# Patient Record
Sex: Female | Born: 1955 | Race: White | Hispanic: No | Marital: Married | Smoking: Former smoker
Health system: Southern US, Community
[De-identification: ages and names within clinical notes are randomized; demographics above are authoritative.]

## PROBLEM LIST (undated history)

## (undated) DIAGNOSIS — K219 Gastro-esophageal reflux disease without esophagitis: Secondary | ICD-10-CM

## (undated) DIAGNOSIS — H409 Unspecified glaucoma: Secondary | ICD-10-CM

## (undated) DIAGNOSIS — Z9289 Personal history of other medical treatment: Secondary | ICD-10-CM

## (undated) DIAGNOSIS — J439 Emphysema, unspecified: Secondary | ICD-10-CM

## (undated) DIAGNOSIS — F32A Depression, unspecified: Secondary | ICD-10-CM

## (undated) DIAGNOSIS — J449 Chronic obstructive pulmonary disease, unspecified: Secondary | ICD-10-CM

## (undated) DIAGNOSIS — T4145XA Adverse effect of unspecified anesthetic, initial encounter: Secondary | ICD-10-CM

## (undated) DIAGNOSIS — Z8709 Personal history of other diseases of the respiratory system: Secondary | ICD-10-CM

## (undated) DIAGNOSIS — I509 Heart failure, unspecified: Secondary | ICD-10-CM

## (undated) DIAGNOSIS — G629 Polyneuropathy, unspecified: Secondary | ICD-10-CM

## (undated) DIAGNOSIS — Z9981 Dependence on supplemental oxygen: Secondary | ICD-10-CM

## (undated) DIAGNOSIS — R531 Weakness: Secondary | ICD-10-CM

## (undated) DIAGNOSIS — J45909 Unspecified asthma, uncomplicated: Secondary | ICD-10-CM

## (undated) DIAGNOSIS — F329 Major depressive disorder, single episode, unspecified: Secondary | ICD-10-CM

## (undated) DIAGNOSIS — Z889 Allergy status to unspecified drugs, medicaments and biological substances status: Secondary | ICD-10-CM

## (undated) DIAGNOSIS — R42 Dizziness and giddiness: Secondary | ICD-10-CM

## (undated) DIAGNOSIS — R2689 Other abnormalities of gait and mobility: Secondary | ICD-10-CM

## (undated) DIAGNOSIS — J189 Pneumonia, unspecified organism: Secondary | ICD-10-CM

## (undated) DIAGNOSIS — T8859XA Other complications of anesthesia, initial encounter: Secondary | ICD-10-CM

## (undated) DIAGNOSIS — R0600 Dyspnea, unspecified: Secondary | ICD-10-CM

## (undated) DIAGNOSIS — M1990 Unspecified osteoarthritis, unspecified site: Secondary | ICD-10-CM

## (undated) DIAGNOSIS — M2540 Effusion, unspecified joint: Secondary | ICD-10-CM

## (undated) DIAGNOSIS — F39 Unspecified mood [affective] disorder: Secondary | ICD-10-CM

## (undated) HISTORY — DX: Heart failure, unspecified: I50.9

## (undated) HISTORY — PX: TONSILLECTOMY AND ADENOIDECTOMY: SUR1326

## (undated) HISTORY — DX: Unspecified osteoarthritis, unspecified site: M19.90

## (undated) HISTORY — DX: Gastro-esophageal reflux disease without esophagitis: K21.9

## (undated) HISTORY — PX: BACK SURGERY: SHX140

## (undated) HISTORY — PX: NECK SURGERY: SHX720

## (undated) HISTORY — PX: TUBAL LIGATION: SHX77

## (undated) HISTORY — PX: ROTATOR CUFF REPAIR: SHX139

---

## 1981-07-30 DIAGNOSIS — Z9289 Personal history of other medical treatment: Secondary | ICD-10-CM

## 1981-07-30 HISTORY — DX: Personal history of other medical treatment: Z92.89

## 2013-07-31 ENCOUNTER — Inpatient Hospital Stay (HOSPITAL_COMMUNITY)
Admission: AD | Admit: 2013-07-31 | Discharge: 2013-08-02 | DRG: 291 | Disposition: A | Discharge status: Home / Self Care | Payer: BC Managed Care – PPO | Source: Other Acute Inpatient Hospital | Attending: Family Medicine | Admitting: Family Medicine

## 2013-07-31 ENCOUNTER — Encounter (HOSPITAL_COMMUNITY): Payer: Self-pay | Admitting: Internal Medicine

## 2013-07-31 DIAGNOSIS — R0989 Other specified symptoms and signs involving the circulatory and respiratory systems: Secondary | ICD-10-CM

## 2013-07-31 DIAGNOSIS — N182 Chronic kidney disease, stage 2 (mild): Secondary | ICD-10-CM | POA: Diagnosis present

## 2013-07-31 DIAGNOSIS — Z87891 Personal history of nicotine dependence: Secondary | ICD-10-CM

## 2013-07-31 DIAGNOSIS — I5032 Chronic diastolic (congestive) heart failure: Secondary | ICD-10-CM

## 2013-07-31 DIAGNOSIS — J9819 Other pulmonary collapse: Secondary | ICD-10-CM | POA: Diagnosis present

## 2013-07-31 DIAGNOSIS — R7989 Other specified abnormal findings of blood chemistry: Secondary | ICD-10-CM | POA: Diagnosis present

## 2013-07-31 DIAGNOSIS — R0609 Other forms of dyspnea: Secondary | ICD-10-CM

## 2013-07-31 DIAGNOSIS — J9600 Acute respiratory failure, unspecified whether with hypoxia or hypercapnia: Secondary | ICD-10-CM | POA: Diagnosis present

## 2013-07-31 DIAGNOSIS — G479 Sleep disorder, unspecified: Secondary | ICD-10-CM | POA: Diagnosis present

## 2013-07-31 DIAGNOSIS — Z9889 Other specified postprocedural states: Secondary | ICD-10-CM

## 2013-07-31 DIAGNOSIS — J439 Emphysema, unspecified: Secondary | ICD-10-CM | POA: Diagnosis present

## 2013-07-31 DIAGNOSIS — I509 Heart failure, unspecified: Secondary | ICD-10-CM | POA: Diagnosis present

## 2013-07-31 DIAGNOSIS — J9601 Acute respiratory failure with hypoxia: Secondary | ICD-10-CM

## 2013-07-31 DIAGNOSIS — Z79899 Other long term (current) drug therapy: Secondary | ICD-10-CM

## 2013-07-31 DIAGNOSIS — F39 Unspecified mood [affective] disorder: Secondary | ICD-10-CM | POA: Diagnosis present

## 2013-07-31 DIAGNOSIS — I129 Hypertensive chronic kidney disease with stage 1 through stage 4 chronic kidney disease, or unspecified chronic kidney disease: Secondary | ICD-10-CM | POA: Diagnosis present

## 2013-07-31 DIAGNOSIS — I5031 Acute diastolic (congestive) heart failure: Principal | ICD-10-CM | POA: Diagnosis present

## 2013-07-31 HISTORY — DX: Unspecified mood (affective) disorder: F39

## 2013-07-31 LAB — COMPREHENSIVE METABOLIC PANEL
ALT: 24 U/L (ref 0–35)
AST: 38 U/L — AB (ref 0–37)
Albumin: 3.4 g/dL — ABNORMAL LOW (ref 3.5–5.2)
Alkaline Phosphatase: 53 U/L (ref 39–117)
BILIRUBIN TOTAL: 0.3 mg/dL (ref 0.3–1.2)
BUN: 9 mg/dL (ref 6–23)
CHLORIDE: 100 meq/L (ref 96–112)
CO2: 24 mEq/L (ref 19–32)
Calcium: 8.7 mg/dL (ref 8.4–10.5)
Creatinine, Ser: 0.85 mg/dL (ref 0.50–1.10)
GFR, EST AFRICAN AMERICAN: 88 mL/min — AB (ref >90)
GFR, EST NON AFRICAN AMERICAN: 76 mL/min — AB (ref >90)
Glucose, Bld: 184 mg/dL — ABNORMAL HIGH (ref 70–99)
Potassium: 4.4 mEq/L (ref 3.7–5.3)
Sodium: 141 mEq/L (ref 137–147)
Total Protein: 7 g/dL (ref 6.0–8.3)

## 2013-07-31 LAB — CBC WITH DIFFERENTIAL/PLATELET
Basophils Absolute: 0 10*3/uL (ref 0.0–0.1)
Basophils Relative: 0 % (ref 0–1)
Eosinophils Absolute: 0 10*3/uL (ref 0.0–0.7)
Eosinophils Relative: 0 % (ref 0–5)
HCT: 39 % (ref 36.0–46.0)
Hemoglobin: 13.1 g/dL (ref 12.0–15.0)
LYMPHS PCT: 14 % (ref 12–46)
Lymphs Abs: 0.8 10*3/uL (ref 0.7–4.0)
MCH: 32 pg (ref 26.0–34.0)
MCHC: 33.6 g/dL (ref 30.0–36.0)
MCV: 95.1 fL (ref 78.0–100.0)
MONO ABS: 0.1 10*3/uL (ref 0.1–1.0)
MONOS PCT: 1 % — AB (ref 3–12)
NEUTROS ABS: 5 10*3/uL (ref 1.7–7.7)
Neutrophils Relative %: 85 % — ABNORMAL HIGH (ref 43–77)
Platelets: 169 10*3/uL (ref 150–400)
RBC: 4.1 MIL/uL (ref 3.87–5.11)
RDW: 13.2 % (ref 11.5–15.5)
WBC: 5.9 10*3/uL (ref 4.0–10.5)

## 2013-07-31 LAB — TROPONIN I
Troponin I: <0.3 ng/mL (ref <0.30)
Troponin I: <0.3 ng/mL (ref <0.30)

## 2013-07-31 LAB — TSH: TSH: 0.202 u[IU]/mL — ABNORMAL LOW (ref 0.350–4.500)

## 2013-07-31 LAB — PROTIME-INR
INR: 0.99 (ref 0.00–1.49)
Prothrombin Time: 12.9 seconds (ref 11.6–15.2)

## 2013-07-31 LAB — PRO B NATRIURETIC PEPTIDE: Pro B Natriuretic peptide (BNP): 627.6 pg/mL — ABNORMAL HIGH (ref 0–125)

## 2013-07-31 LAB — T4, FREE: FREE T4: 1.31 ng/dL (ref 0.80–1.80)

## 2013-07-31 MED ORDER — OXYCODONE HCL 5 MG PO TABS
5.0000 mg | ORAL_TABLET | ORAL | Status: DC | PRN
  Administered 2013-07-31 (×3): 5 mg via ORAL
  Filled 2013-07-31 (×3): qty 1

## 2013-07-31 MED ORDER — IPRATROPIUM-ALBUTEROL 20-100 MCG/ACT IN AERS
1.0000 | INHALATION_SPRAY | Freq: Four times a day (QID) | RESPIRATORY_TRACT | Status: DC
  Administered 2013-07-31: 1 via RESPIRATORY_TRACT
  Filled 2013-07-31: qty 4

## 2013-07-31 MED ORDER — FUROSEMIDE 10 MG/ML IJ SOLN
40.0000 mg | Freq: Once | INTRAMUSCULAR | Status: AC
  Administered 2013-07-31: 40 mg via INTRAVENOUS
  Filled 2013-07-31: qty 4

## 2013-07-31 MED ORDER — ENOXAPARIN SODIUM 40 MG/0.4ML ~~LOC~~ SOLN
40.0000 mg | Freq: Every day | SUBCUTANEOUS | Status: DC
  Administered 2013-07-31 – 2013-08-02 (×3): 40 mg via SUBCUTANEOUS
  Filled 2013-07-31 (×3): qty 0.4

## 2013-07-31 MED ORDER — INDOMETHACIN 50 MG PO CAPS
50.0000 mg | ORAL_CAPSULE | Freq: Three times a day (TID) | ORAL | Status: DC
  Administered 2013-07-31 – 2013-08-02 (×8): 50 mg via ORAL
  Filled 2013-07-31 (×10): qty 1

## 2013-07-31 MED ORDER — OXYCODONE HCL 5 MG PO TABS
10.0000 mg | ORAL_TABLET | ORAL | Status: DC | PRN
  Administered 2013-07-31 – 2013-08-02 (×9): 10 mg via ORAL
  Filled 2013-07-31 (×9): qty 2

## 2013-07-31 MED ORDER — POTASSIUM CHLORIDE CRYS ER 20 MEQ PO TBCR
40.0000 meq | EXTENDED_RELEASE_TABLET | Freq: Once | ORAL | Status: AC
  Administered 2013-07-31: 40 meq via ORAL
  Filled 2013-07-31: qty 2

## 2013-07-31 MED ORDER — METHOCARBAMOL 500 MG PO TABS
500.0000 mg | ORAL_TABLET | Freq: Four times a day (QID) | ORAL | Status: DC | PRN
  Administered 2013-07-31 – 2013-08-02 (×4): 500 mg via ORAL
  Filled 2013-07-31 (×4): qty 1

## 2013-07-31 MED ORDER — IPRATROPIUM-ALBUTEROL 0.5-2.5 (3) MG/3ML IN SOLN
3.0000 mL | Freq: Four times a day (QID) | RESPIRATORY_TRACT | Status: DC
  Administered 2013-07-31 – 2013-08-01 (×4): 3 mL via RESPIRATORY_TRACT
  Filled 2013-07-31 (×4): qty 3

## 2013-07-31 MED ORDER — FAMOTIDINE 20 MG PO TABS
20.0000 mg | ORAL_TABLET | Freq: Two times a day (BID) | ORAL | Status: DC
  Administered 2013-07-31 – 2013-08-02 (×5): 20 mg via ORAL
  Filled 2013-07-31 (×6): qty 1

## 2013-07-31 MED ORDER — CITALOPRAM HYDROBROMIDE 40 MG PO TABS
40.0000 mg | ORAL_TABLET | Freq: Every day | ORAL | Status: DC
  Administered 2013-07-31 – 2013-08-02 (×3): 40 mg via ORAL
  Filled 2013-07-31 (×3): qty 1

## 2013-07-31 NOTE — Progress Notes (Signed)
Utilization review completed.  

## 2013-07-31 NOTE — H&P (Signed)
[facility] History and Physical  Patient: [name]  ZOX:[id number]  DOB: [birth year]  DOS: the patient was seen and examined on 07/31/2013 PCP: No primary provider on file.  Chief Complaint: Shortness of breath  HPI: [name] is a 58 y.o. female with Past medical history of mood disorder former smoker. The patient is coming from home The patient presents with complaints of shortness of breath got worse today. She mentions that she has dyspnea on exertion that has been ongoing since more than 6 months and she is only able to walk or climb one flight of stair without getting out of breath. She called that this is secondary to weight gain and she tried to lose weight without any improvement in her breathing. She recently underwent an rotator cuff repair surgery 2 days ago as an outpatient. She mentions that she was placed on oxygen both before and after the surgery. Postoperatively she was discharged home without oxygen and it is not clear whether they check her oxygen saturation before discharge. Of the discharge at home within 4-5 hours she started complaining of shortness of breath at rest. They check her oxygen saturation at home with a pulse oximeter and it was in 70s. She was taken to an urgent care clinic where she was found to have further lower oxygen saturations in 60s after giving nebulizer treatment. She was sent to the hospital [facility] at which a CT angiogram was negative for any PE and it also did not show any pneumonia. She mentions that she had some pain in her left lower back which was worsening with breathing and improved with oxygen. She denies any Chest pain, palpitation, orthopnea, PND, leg swelling. She mentions that she has not been able to sleep well due to her back and wakes up tired and also had to take daytime naps and complains of significant fatigue and lethargy ongoing since last 6 months she also snores heavily. She denies any exposure to chemicals, fumes, birds,  mold. She is a former heavy smoker and quit smoking 15 years ago  Review of Systems: as mentioned in the history of present illness.  A Comprehensive review of the other systems is negative.  Past Medical History  Diagnosis Date  . Mood disorder   . S/P rotator cuff repair   . H/O laminectomy    No past surgical history on file. Social History:  reports that she quit smoking about 15 years ago. Her smoking use included Cigarettes. She has a 30 pack-year smoking history. She does not have any smokeless tobacco history on file. Her alcohol and drug histories are not on file. Independent for most of her  ADL.  Not on File  No family history on file.  Prior to Admission medications   Medication Sig Start Date End Date Taking? Authorizing Provider  citalopram (CELEXA) 40 MG tablet Take 40 mg by mouth daily.   Yes Historical Provider, MD  indomethacin (INDOCIN) 50 MG capsule Take 50 mg by mouth 3 (three) times daily with meals.    Yes Historical Provider, MD  methocarbamol (ROBAXIN) 500 MG tablet Take 500 mg by mouth every 6 (six) hours as needed for muscle spasms.   Yes Historical Provider, MD  oxycodone (OXY-IR) 5 MG capsule Take 5 mg by mouth every 4 (four) hours as needed for pain.   Yes Historical Provider, MD  ranitidine (ZANTAC) 150 MG tablet Take 150 mg by mouth 2 (two) times daily.   Yes Historical Provider, MD  Physical Exam: There were no vitals filed for this visit.  General: Alert, Awake and Oriented to Time, Place and Person. Appear in moderate distress Eyes: PERRL ENT: Oral Mucosa clear moist. Neck: no JVD Cardiovascular: S1 and S2 Present, no Murmur, Peripheral Pulses Present Respiratory: Bilateral Air entry equal and Decreased, Clear to Auscultation,  no Crackles,no wheezes Abdomen: Bowel Sound Present, Soft and Non tender Skin: no Rash Extremities: no Pedal edema, no calf tenderness Neurologic: Grossly Unremarkable.  Labs on Admission:   From the other  facility has been reviewed  WBC 10 hemoglobin 12.7 Serum creatinine 1,  ekg NSR,  CTA negative for PE, some coronary artery disease. Mild atelectasis on the left. Possible reticular pattern on periphery.  NT BNP 1030    Assessment/Plan Principal Problem:   Acute respiratory failure with hypoxia Active Problems:   Mood disorder   S/P rotator cuff repair   H/O laminectomy   DOE (dyspnea on exertion)   Sleep disorder   1. Acute respiratory failure with hypoxia The patient is presenting with acute respiratory failure with hypoxia. She does not have any complaints of cough fever and her CT scan of the chest does not suggest any pneumonia she has any white count. Her CT scan of the chest is also negative for any pulmonary embolism. She has complaint of shortness of breath which has been ongoing since last 6 months and acutely got worse. She has been a former smoker and has some peripheral reticular pattern on a CT scan of the lung. She also appears to have some mild patchy atelectasis. At present I suspect that her current hypoxia is secondary to atelectasis postoperatively with a combination of undiagnosed COPD. Although the reticular pattern on the CT scan of the chest but also suggest possible ILD. With this she will be admitted for observation. Oxygen as needed will be given. Incentive spirometry every hour at an as needed. She was also given Combivent inhalers 4 times a day. She may require oxygen even at the time of the discharge. She has symptoms of sleep apnea he may possibly have pulmonary hypertension for which I will obtain echocardiogram. She may benefit from a PFT as an outpatient and followup with the pulmonologist as well as sleep study as an outpatient. 2. Rotator cuff injury Continue her methocarbamol and OxyIR as needed Also continuing to coolant wrap for the shoulder.  3. Mood disorder Continue Celexa  DVT Prophylaxis: subcutaneous Heparin Nutrition: Regular  diet  Code Status: Full  Family Communication: Daughter was present at bedside, opportunity was given to ask question and all questions were answered satisfactorily at the time of interview. Disposition: Admitted to observation in telemetry unit.  Author: Lynden OxfordPranav Patel, MD [facility] Pager: [phone] 07/31/2013, 3:36 AM    If 7PM-7AM, please contact night-coverage [email or web address] Password [email or web address]

## 2013-07-31 NOTE — Progress Notes (Signed)
[facility] Progress Note [place] TEAM [address]    [name] WUJ:[id number] DOB: Oct 30, 1955 DOA: 07/31/2013 PCP: No primary provider on file.  Brief narrative: 58 year old female patient with six-month history of progressive dyspnea on exertion. Initially attributed this to significant weight gain over the past year. 2 days ago patient underwent rotator cuff surgery repair as an outpatient. Apparently in the preoperative and immediate postoperative period patient was hypoxic and required oxygen but stabilized enough and was discharged without oxygen. Within 4-5 hours after being discharged home though she presented [facility] with complaints of shortness of breath. The patient apparently has a home oximetry machine and she checked her reading it was in the 70s. She presented to a local urgent care clinic and O2 saturations when this 66 she was given a nebulizer treatment without improvement. She was subsequently sent to [facility] where CT angios negative for PE but also did not show any evidence of pneumonia. Patient has had significant fatigue and lethargy and difficulty sleeping and takes daytime naps for least 6 months and apparently she snores heavily. Upon presentation to [place] she remained hypoxic and was requiring oxygen. Pro BNP was somewhat elevated.  Assessment/Plan: Active Problems:  Acute respiratory failure with hypoxia/DOE  -given acute worsening of sx's post OR could have degree of DD from volume overload/IVFs used in procedure- BNP up so will give 1x dose IV Lasix to see if sx's improve -FU on ECHO to r/o CHF- look for RMA re: ?? Ischemia -cont supportive- wean O2 as sats permit      Abnormal TSH -low at 0.202- check T3 and Free T4 -currently not tachycardic    Sleep disorder/? OSA -history seems c/w this as a diagnosis -will FU on ECHO and trend continuos pulse ox overnight (if off O2) -needs OP PSG- have asked CM to assist with appt    CKD  (chronic kidney disease), stage II    Mood disorder    S/P rotator cuff repair    H/O laminectomy   DVT prophylaxis: Lovenox Code Status: Full Family Communication: Family at bedside Disposition Plan/Expected LOS: Remain in telemetry-discharge when able to be weaned from oxygen Isolation: None Nutritional Status:  Stable  Consultants: None  Procedures: 2-D echocardiogram pending  Antibiotics: None  HPI/Subjective: Patient sitting on side of the bed. Suction in place is not experiencing any shortness of breath or dyspnea.  Objective: Blood pressure 117/72, pulse 87, temperature 98.4 F (36.9 C), temperature source Oral, resp. rate 18, height 5\' 5"  (1.651 m), weight 179 lb 4.8 oz (81.33 kg), SpO2 94.00%. No intake or output data in the 24 hours ending 07/31/13 1401   Exam: Follow up exam completed. Patient admitted at 5:21 AM today  Scheduled Meds:  Scheduled Meds: . citalopram  40 mg Oral Daily  . enoxaparin (LOVENOX) injection  40 mg Subcutaneous Daily  . famotidine  20 mg Oral BID  . indomethacin  50 mg Oral TID WC  . ipratropium-albuterol  3 mL Nebulization Q6H   Continuous Infusions:   **Reviewed in detail by the Attending Physician  Data Reviewed: Basic Metabolic Panel:  Recent Labs Lab 07/31/13 0653  NA 141  K 4.4  CL 100  CO2 24  GLUCOSE 184*  BUN 9  CREATININE 0.85  CALCIUM 8.7   Liver Function Tests:  Recent Labs Lab 07/31/13 0653  AST 38*  ALT 24  ALKPHOS 53  BILITOT 0.3  PROT 7.0  ALBUMIN 3.4*   No  results found for this basename: LIPASE, AMYLASE,  in the last 168 hours No results found for this basename: AMMONIA,  in the last 168 hours CBC:  Recent Labs Lab 07/31/13 0653  WBC 5.9  NEUTROABS 5.0  HGB 13.1  HCT 39.0  MCV 95.1  PLT 169   Cardiac Enzymes:  Recent Labs Lab 07/31/13 0653 07/31/13 0855  TROPONINI <0.30 <0.30   BNP (last 3 results)  Recent Labs  07/31/13 0653  PROBNP 627.6*   CBG: No results  found for this basename: GLUCAP,  in the last 168 hours  No results found for this or any previous visit (from the past 240 hour(s)).   Studies:  Recent x-ray studies have been reviewed in detail by the Attending Physician     Junious Silk, ANP [facility] Office  [phone] Pager [phone]  **If unable to reach the above provider after paging please contact the Flow Manager @ [phone]  On-Call/Text Page:      [email or web address]      password [email or web address]  If 7PM-7AM, please contact night-coverage [email or web address] Password [email or web address] 07/31/2013, 2:01 PM   LOS: 0 days

## 2013-07-31 NOTE — Progress Notes (Signed)
  Echocardiogram 2D Echocardiogram has been performed.  Khyle Goodell FRANCES 07/31/2013, 5:45 PM

## 2013-07-31 NOTE — Care Management Note (Signed)
    Page 1 of 1   07/31/2013     3:18:06 PM   CARE MANAGEMENT NOTE 07/31/2013  Patient:  [name],[name]   Account Number:  [id number]  Date Initiated:  07/31/2013  Documentation initiated by:  Donn PieriniWEBSTER,Lillionna Nabi  Subjective/Objective Assessment:   Pt admitted with reso. distress, s/p rotator cuff repair 2 days prior     Action/Plan:   PTA pt lived at home,   Anticipated DC Date:  08/01/2013   Anticipated DC Plan:  HOME/SELF CARE      DC Planning Services  CM consult  Other      Choice offered to / List presented to:             Status of service:  Completed, signed off Medicare Important Message given?   (If response is "NO", the following Medicare IM given date fields will be blank) Date Medicare IM given:   Date Additional Medicare IM given:    Discharge Disposition:  HOME/SELF CARE  Per UR Regulation:  Reviewed for med. necessity/level of care/duration of stay  If discussed at Long Length of Stay Meetings, dates discussed:    Comments:  07/31/13- 1500- Donn PieriniKristi Iyana Topor RN, BSN [phone] Referral received for sleep study- spoke with pt at bedside who agrees with referral- verified pt's address and information- and faxed referral to [facility]- called center and spoke with Theodoro Gristave- who states that they will call pt next week to schedule study.

## 2013-08-01 DIAGNOSIS — R6889 Other general symptoms and signs: Secondary | ICD-10-CM

## 2013-08-01 DIAGNOSIS — R0609 Other forms of dyspnea: Secondary | ICD-10-CM

## 2013-08-01 DIAGNOSIS — F39 Unspecified mood [affective] disorder: Secondary | ICD-10-CM

## 2013-08-01 DIAGNOSIS — J9600 Acute respiratory failure, unspecified whether with hypoxia or hypercapnia: Secondary | ICD-10-CM

## 2013-08-01 DIAGNOSIS — R0989 Other specified symptoms and signs involving the circulatory and respiratory systems: Secondary | ICD-10-CM

## 2013-08-01 LAB — BASIC METABOLIC PANEL
BUN: 16 mg/dL (ref 6–23)
CALCIUM: 8.3 mg/dL — AB (ref 8.4–10.5)
CHLORIDE: 100 meq/L (ref 96–112)
CO2: 26 meq/L (ref 19–32)
CREATININE: 0.95 mg/dL (ref 0.50–1.10)
GFR calc non Af Amer: 66 mL/min — ABNORMAL LOW (ref >90)
GFR, EST AFRICAN AMERICAN: 77 mL/min — AB (ref >90)
Glucose, Bld: 89 mg/dL (ref 70–99)
Potassium: 4.5 mEq/L (ref 3.7–5.3)
Sodium: 138 mEq/L (ref 137–147)

## 2013-08-01 LAB — T3: T3, Total: 90.6 ng/dl (ref 80.0–204.0)

## 2013-08-01 MED ORDER — IPRATROPIUM-ALBUTEROL 0.5-2.5 (3) MG/3ML IN SOLN: 3.0000 mL | RESPIRATORY_TRACT | Status: DC | PRN

## 2013-08-01 MED ORDER — IPRATROPIUM BROMIDE 0.02 % IN SOLN: 0.5000 mg | RESPIRATORY_TRACT | Status: DC

## 2013-08-01 MED ORDER — IPRATROPIUM-ALBUTEROL 0.5-2.5 (3) MG/3ML IN SOLN
3.0000 mL | Freq: Four times a day (QID) | RESPIRATORY_TRACT | Status: DC
  Administered 2013-08-01: 3 mL via RESPIRATORY_TRACT
  Filled 2013-08-01: qty 3

## 2013-08-01 MED ORDER — FUROSEMIDE 10 MG/ML IJ SOLN
20.0000 mg | Freq: Two times a day (BID) | INTRAMUSCULAR | Status: DC
  Administered 2013-08-01 – 2013-08-02 (×3): 20 mg via INTRAVENOUS
  Filled 2013-08-01 (×3): qty 2

## 2013-08-01 MED ORDER — FUROSEMIDE 10 MG/ML IJ SOLN
INTRAMUSCULAR | Status: AC
  Filled 2013-08-01: qty 4

## 2013-08-01 NOTE — Progress Notes (Signed)
[facility] Progress Note  TEAM [address]    Jo LaughterLinda Vanderkolk WUJ:[id number] DOB: [birth year] DOA: 07/31/2013 PCP: No primary provider on file.  Brief narrative: [age] year old female patient with six-month history of progressive dyspnea on exertion. Initially attributed this to significant weight gain over the past year. 2 days ago patient underwent rotator cuff surgery repair as an outpatient. Apparently in the preoperative and immediate postoperative period patient was hypoxic and required oxygen but stabilized enough and was discharged without oxygen. Within 4-5 hours after being discharged home though she [facility] with complaints of shortness of breath. The patient apparently has a home oximetry machine and she checked her reading it was in the 70s. She presented to a local urgent care clinic and O2 saturations when this 66 she was given a nebulizer treatment without improvement. She was subsequently sent to the [facility] where CT angios negative for PE but also did not show any evidence of pneumonia. Patient has had significant fatigue and lethargy and difficulty sleeping and takes daytime naps for least 6 months and apparently she snores heavily. Upon presentation to [place] she remained hypoxic and was requiring oxygen. Pro BNP was somewhat elevated.  Assessment/Plan: Active Problems:  Acute respiratory failure with hypoxia/DOE  Some improvement with IV lasix, but still getting hypoxic on exertion with O2 sats dropping to 80s. Will continue with Lasix 20 mg IV every 12 hours today echocardiogram showed grade 1 diastolic dysfunction We'll continue to monitor the I.'s and O.'s and try to wean off the oxygen      Abnormal TSH -low at 0.202- check T3 and Free T4 -currently not tachycardic We'll follow her PCP    Sleep disorder/? OSA -history seems c/w this as a diagnosis -needs OP PSG- have asked CM to assist with appt    CKD (chronic kidney disease),  stage II    Mood disorder    S/P rotator cuff repair- continue oxycodone when necessary for pain    H/O laminectomy    DVT prophylaxis: Lovenox Code Status: Full Family Communication: Family at bedside Disposition Plan/Expected LOS: Remain in telemetry-discharge when able to be weaned from oxygen Isolation: None Nutritional Status:  Stable  Consultants: None  Procedures: 2-D echocardiogram shows grade 1 diastolic dysfunction  Antibiotics: None  HPI/Subjective: Patient's and examined, denies shortness of breath or chest pain at this time.  Objective: Blood pressure 95/65, pulse 95, temperature 98.8 F (37.1 C), temperature source Oral, resp. rate 18, height 5\' 5"  (1.651 m), weight 83.734 kg (184 lb 9.6 oz), SpO2 93.00%.  Intake/Output Summary (Last 24 hours) at 08/01/13 1819 Last data filed at 08/01/13 1500  Gross per 24 hour  Intake    600 ml  Output      0 ml  Net    600 ml     Exam: Physical Exam: Head: Normocephalic, atraumatic.  Eyes: No signs of jaundice, EOMI Nose: Mucous membranes dry.  Throat: Oropharynx nonerythematous, no exudate appreciated.  Neck: supple,No deformities, masses, or tenderness noted. Lungs: Normal respiratory effort. B/L Clear to auscultation, no crackles or wheezes.  Heart: Regular RR. S1 and S2 normal  Abdomen: BS normoactive. Soft, Nondistended, non-tender.  Extremities: No pretibial edema, no erythema Right shoulder in sling   Scheduled Meds:  Scheduled Meds: . citalopram  40 mg Oral Daily  . enoxaparin (LOVENOX) injection  40 mg Subcutaneous Daily  . famotidine  20 mg Oral BID  . furosemide  20 mg Intravenous Q12H  . indomethacin  50 mg Oral TID WC     Data Reviewed: Basic Metabolic Panel:  Recent Labs Lab 07/31/13 0653 08/01/13 0538  NA 141 138  K 4.4 4.5  CL 100 100  CO2 24 26  GLUCOSE 184* 89  BUN 9 16  CREATININE 0.85 0.95  CALCIUM 8.7 8.3*   Liver Function Tests:  Recent Labs Lab 07/31/13 0653   AST 38*  ALT 24  ALKPHOS 53  BILITOT 0.3  PROT 7.0  ALBUMIN 3.4*   No results found for this basename: LIPASE, AMYLASE,  in the last 168 hours No results found for this basename: AMMONIA,  in the last 168 hours CBC:  Recent Labs Lab 07/31/13 0653  WBC 5.9  NEUTROABS 5.0  HGB 13.1  HCT 39.0  MCV 95.1  PLT 169   Cardiac Enzymes:  Recent Labs Lab 07/31/13 0653 07/31/13 0855 07/31/13 1435  TROPONINI <0.30 <0.30 <0.30   BNP (last 3 results)  Recent Labs  07/31/13 0653  PROBNP 627.6*   CBG: No results found for this basename: GLUCAP,  in the last 168 hours  No results found for this or any previous visit (from the past 240 hour(s)).       A

## 2013-08-01 NOTE — Progress Notes (Signed)
Unable to wean patient off oxygen desaturations to 84% on room air .Oxygen in place at 1 liter nasal  cannula oxygen saturations gradually  increase  to  90-93%.Complains of being dizzy  and short of breath when saturations low.Will continue to monitor patient.

## 2013-08-01 NOTE — Progress Notes (Signed)
Utilization Review completed.  

## 2013-08-01 NOTE — Progress Notes (Signed)
Spoke with patient about changes that MD made in treatment. She stated she did not feel as if they were making a difference. She stated that she felt like the increased her HR, and then the abrupt drop made her feel bad. I told her MD had changed her to Atrovent Q4, and she stated she did not want to be woken up at night. She asked if we could just do them as needed if she felt like she had a "heavy feeling" and I told her I could reassess her. Will change to Duoneb q4 PRN only. Patient was pleased with this change and felt like this was the care that she needed. BBS clear and slightly diminshed, SAT 93-94% on 1-2 L. RT will continue to monitor.

## 2013-08-02 DIAGNOSIS — Z9889 Other specified postprocedural states: Secondary | ICD-10-CM

## 2013-08-02 DIAGNOSIS — G479 Sleep disorder, unspecified: Secondary | ICD-10-CM

## 2013-08-02 DIAGNOSIS — I5032 Chronic diastolic (congestive) heart failure: Secondary | ICD-10-CM

## 2013-08-02 LAB — BASIC METABOLIC PANEL
BUN: 20 mg/dL (ref 6–23)
CALCIUM: 9.5 mg/dL (ref 8.4–10.5)
CO2: 30 mEq/L (ref 19–32)
Chloride: 95 mEq/L — ABNORMAL LOW (ref 96–112)
Creatinine, Ser: 0.98 mg/dL (ref 0.50–1.10)
GFR calc Af Amer: 74 mL/min — ABNORMAL LOW (ref >90)
GFR calc non Af Amer: 64 mL/min — ABNORMAL LOW (ref >90)
GLUCOSE: 90 mg/dL (ref 70–99)
Potassium: 4.3 mEq/L (ref 3.7–5.3)
Sodium: 138 mEq/L (ref 137–147)

## 2013-08-02 LAB — CBC
HCT: 41.4 % (ref 36.0–46.0)
Hemoglobin: 14.2 g/dL (ref 12.0–15.0)
MCH: 31.9 pg (ref 26.0–34.0)
MCHC: 34.3 g/dL (ref 30.0–36.0)
MCV: 93 fL (ref 78.0–100.0)
PLATELETS: 227 10*3/uL (ref 150–400)
RBC: 4.45 MIL/uL (ref 3.87–5.11)
RDW: 13 % (ref 11.5–15.5)
WBC: 8.8 10*3/uL (ref 4.0–10.5)

## 2013-08-02 MED ORDER — DOCUSATE SODIUM 100 MG PO CAPS
100.0000 mg | ORAL_CAPSULE | Freq: Two times a day (BID) | ORAL | Status: DC
  Administered 2013-08-02: 100 mg via ORAL
  Filled 2013-08-02 (×2): qty 1

## 2013-08-02 MED ORDER — POLYETHYLENE GLYCOL 3350 17 G PO PACK
17.0000 g | PACK | Freq: Every day | ORAL | Status: DC
  Administered 2013-08-02: 17 g via ORAL
  Filled 2013-08-02: qty 1

## 2013-08-02 MED ORDER — FUROSEMIDE 20 MG PO TABS: 20.0000 mg | ORAL_TABLET | Freq: Every day | ORAL | Status: DC

## 2013-08-02 MED ORDER — BISACODYL 10 MG RE SUPP: 10.0000 mg | Freq: Every day | RECTAL | Status: DC | PRN

## 2013-08-02 NOTE — Discharge Instructions (Addendum)
Check Thyroid functions as outpatient at your PCP office

## 2013-08-02 NOTE — Progress Notes (Signed)
Discharge review done with patient.   Patient acknowledged understanding of information provided.  Prescription given per MD's order.  Patient is stable and discharged home with husband. [name], Ritika Hellickson Senze

## 2013-08-02 NOTE — Progress Notes (Signed)
Utilization Review completed.  

## 2013-08-02 NOTE — Discharge Summary (Signed)
Physician Discharge Summary  [name] FAO:[id number] DOB: 1955-10-08 DOA: 07/31/2013  PCP: No primary provider on file.  Admit date: 07/31/2013 Discharge date: 08/02/2013  Time spent: *50 minutes  Recommendations for Outpatient Follow-up:  1. *Follow up PCP in one week 2. Follow up- Sleep lab for sleep study as outpatient  Discharge Diagnoses:  Active Problems:   Mood disorder   S/P rotator cuff repair   H/O laminectomy   DOE (dyspnea on exertion)   Acute respiratory failure with hypoxia   Sleep disorder/? OSA   Abnormal TSH   CKD (chronic kidney disease), stage II Acute diastolic CHF  Discharge Condition: Stable  Diet recommendation: Low salt diet  Filed Weights   07/31/13 0423 08/01/13 0438 08/02/13 0500  Weight: 81.33 kg (179 lb 4.8 oz) 83.734 kg (184 lb 9.6 oz) 84.052 kg (185 lb 4.8 oz)    History of present illness:  58 y.o. female with Past medical history of mood disorder former smoker.  The patient is coming from home  The patient presents with complaints of shortness of breath got worse today. She mentions that she has dyspnea on exertion that has been ongoing since more than 6 months and she is only able to walk or climb one flight of stair without getting out of breath. She called that this is secondary to weight gain and she tried to lose weight without any improvement in her breathing.  She recently underwent an rotator cuff repair surgery 2 days ago as an outpatient. She mentions that she was placed on oxygen both before and after the surgery. Postoperatively she was discharged home without oxygen and it is not clear whether they check her oxygen saturation before discharge.  Of the discharge at home within 4-5 hours she started complaining of shortness of breath at rest. They check her oxygen saturation at home with a pulse oximeter and it was in 70s. She was taken to an urgent care clinic where she was found to have further lower oxygen saturations in 60s after  giving nebulizer treatment. She was sent to the hospital [facility] at which a CT angiogram was negative for any PE and it also did not show any pneumonia.  She mentions that she had some pain in her left lower back which was worsening with breathing and improved with oxygen.  She denies any Chest pain, palpitation, orthopnea, PND, leg swelling. She mentions that she has not been able to sleep well due to her back and wakes up tired and also had to take daytime naps and complains of significant fatigue and lethargy ongoing since last 6 months she also snores heavily.  She denies any exposure to chemicals, fumes, birds, mold. She is a former heavy smoker and quit smoking 15 years ago      Hospital Course:  58 year old female patient with six-month history of progressive dyspnea on exertion. Initially attributed this to significant weight gain over the past year. 2 days ago patient underwent rotator cuff surgery repair as an outpatient. Apparently in the preoperative and immediate postoperative period patient was hypoxic and required oxygen but stabilized enough and was discharged without oxygen. Within 4-5 hours after being discharged home though she presented to the [facility] ER with complaints of shortness of breath. The patient apparently has a home oximetry machine and she checked her reading it was in the 70s. She presented to a local urgent care clinic and O2 saturations when this 66 she was given a nebulizer treatment without improvement. She was subsequently  sent to the [facility] ER where CT angios negative for PE but also did not show any evidence of pneumonia. Patient has had significant fatigue and lethargy and difficulty sleeping and takes daytime naps for least 6 months and apparently she snores heavily. Upon presentation to [place] she remained hypoxic and was requiring oxygen. Pro BNP was somewhat elevated.  Acute respiratory failure with hypoxia/DOE /Acute diastolic CHF Patient  was started on IV lasix for possible pulmonary edema echocardiogram showed grade 1 diastolic dysfunction.BNP was elevated to 627.6 Patient received lasix 20 mg IV q 12 hr At this time patient's dyspnea has resolved. We checked her oxygen saturation on  Walking and it remained 94% on walking.  Patient will be discharged home on Po lasix 20 mg daily, will need BMP in one week  Abnormal TSH  -Patient had low at 0.202- T3 and FT4 are with in normal limits. She will follow up PCP for repeat testing of the TSH     Sleep disorder/? OSA  -history seems c/w this as a diagnosis  -needs OP PSG-  Has been set up for sleep study as outpatient.   Mood disorder - continue Celexa  S/P rotator cuff repair- continue oxycodone when necessary for pain   H/O laminectomy      Procedures:  2 d echo  Consultations:  None  Discharge Exam: Filed Vitals:   08/02/13 1408  BP: 96/68  Pulse: 82  Temp: 98.5 F (36.9 C)  Resp: 19    General: *Appear in no acute distress Cardiovascular: *s1s2 RRR Respiratory: *Clear bilaterally  Discharge Instructions  Discharge Orders   Future Orders Complete By Expires   Diet - low sodium heart healthy  As directed    Discharge instructions  As directed    Comments:     Need to check Blood work BMP in one week to check the potassium levels and kidney function   Increase activity slowly  As directed        Medication List         citalopram 40 MG tablet  Commonly known as:  CELEXA  Take 40 mg by mouth daily.     furosemide 20 MG tablet  Commonly known as:  LASIX  Take 1 tablet (20 mg total) by mouth daily.  Start taking on:  08/03/2013     indomethacin 50 MG capsule  Commonly known as:  INDOCIN  Take 50 mg by mouth 3 (three) times daily with meals.     methocarbamol 500 MG tablet  Commonly known as:  ROBAXIN  Take 500 mg by mouth every 6 (six) hours as needed for muscle spasms.     oxycodone 5 MG capsule  Commonly known as:  OXY-IR   Take 5-10 mg by mouth every 4 (four) hours as needed for pain.     ranitidine 150 MG tablet  Commonly known as:  ZANTAC  Take 150 mg by mouth 2 (two) times daily.       No Known Allergies     Follow-up Information   Follow up with Dr Sherral Hammers. (Check BMP , Thyroid function  test in one week)       Follow up with [facility]. [phone])        The results of significant diagnostics from this hospitalization (including imaging, microbiology, ancillary and laboratory) are listed below for reference.    Significant Diagnostic Studies: No results found.  Microbiology: No results found for this or any previous visit (from  the past 240 hour(s)).   Labs: Basic Metabolic Panel:  Recent Labs Lab 07/31/13 0653 08/01/13 0538 08/02/13 0509  NA 141 138 138  K 4.4 4.5 4.3  CL 100 100 95*  CO2 24 26 30   GLUCOSE 184* 89 90  BUN 9 16 20   CREATININE 0.85 0.95 0.98  CALCIUM 8.7 8.3* 9.5   Liver Function Tests:  Recent Labs Lab 07/31/13 0653  AST 38*  ALT 24  ALKPHOS 53  BILITOT 0.3  PROT 7.0  ALBUMIN 3.4*   No results found for this basename: LIPASE, AMYLASE,  in the last 168 hours No results found for this basename: AMMONIA,  in the last 168 hours CBC:  Recent Labs Lab 07/31/13 0653 08/02/13 0509  WBC 5.9 8.8  NEUTROABS 5.0  --   HGB 13.1 14.2  HCT 39.0 41.4  MCV 95.1 93.0  PLT 169 227   Cardiac Enzymes:  Recent Labs Lab 07/31/13 0653 07/31/13 0855 07/31/13 1435  TROPONINI <0.30 <0.30 <0.30   BNP: BNP (last 3 results)  Recent Labs  07/31/13 0653  PROBNP 627.6*   CBG: No results found for this basename: GLUCAP,  in the last 168 hours     Signed:  Jamyson Jirak S  [facility] 08/02/2013, 3:12 PM

## 2013-08-02 NOTE — Progress Notes (Signed)
Patient ambulated in hallway without oxygen. O2 sats before ambulating was 92% RA, 93-95% RA while ambulating, 94% RA when returned to her room.  No c/o SOB or fatigued, but c/o left leg weakness.

## 2013-08-06 NOTE — Progress Notes (Signed)
I have reviewed the chart and agree with assessment and plan

## 2013-08-17 ENCOUNTER — Encounter: Payer: Self-pay | Admitting: Cardiology

## 2013-08-17 ENCOUNTER — Ambulatory Visit (INDEPENDENT_AMBULATORY_CARE_PROVIDER_SITE_OTHER): Payer: BC Managed Care – PPO | Admitting: Cardiology

## 2013-08-17 VITALS — BP 118/66 | HR 69 | Ht 65.0 in | Wt 169.0 lb

## 2013-08-17 DIAGNOSIS — R0989 Other specified symptoms and signs involving the circulatory and respiratory systems: Secondary | ICD-10-CM

## 2013-08-17 DIAGNOSIS — I5031 Acute diastolic (congestive) heart failure: Secondary | ICD-10-CM

## 2013-08-17 DIAGNOSIS — Z13220 Encounter for screening for lipoid disorders: Secondary | ICD-10-CM

## 2013-08-17 DIAGNOSIS — I509 Heart failure, unspecified: Secondary | ICD-10-CM

## 2013-08-17 DIAGNOSIS — E7800 Pure hypercholesterolemia, unspecified: Secondary | ICD-10-CM

## 2013-08-17 DIAGNOSIS — N182 Chronic kidney disease, stage 2 (mild): Secondary | ICD-10-CM

## 2013-08-17 DIAGNOSIS — R0609 Other forms of dyspnea: Secondary | ICD-10-CM

## 2013-08-17 LAB — LIPID PANEL
Cholesterol: 210 mg/dL — ABNORMAL HIGH (ref 0–200)
HDL: 38.5 mg/dL — AB (ref >39.00)
TRIGLYCERIDES: 299 mg/dL — AB (ref 0.0–149.0)
Total CHOL/HDL Ratio: 5
VLDL: 59.8 mg/dL — ABNORMAL HIGH (ref 0.0–40.0)

## 2013-08-17 LAB — BASIC METABOLIC PANEL
BUN: 15 mg/dL (ref 6–23)
CALCIUM: 9.3 mg/dL (ref 8.4–10.5)
CO2: 27 mEq/L (ref 19–32)
Chloride: 102 mEq/L (ref 96–112)
Creatinine, Ser: 1 mg/dL (ref 0.4–1.2)
GFR: 61.14 mL/min (ref >60.00)
Glucose, Bld: 87 mg/dL (ref 70–99)
Potassium: 4.4 mEq/L (ref 3.5–5.1)
Sodium: 138 mEq/L (ref 135–145)

## 2013-08-17 LAB — LDL CHOLESTEROL, DIRECT: LDL DIRECT: 139.9 mg/dL

## 2013-08-17 NOTE — Progress Notes (Signed)
HPI The patient was recently admitted with dyspnea.  Her symptoms started about 8 months ago.  She has had shoulder trouble and has not been active.  She has gained about 25 lbs.  She had shoulder surgery in late December.   She required oxygen during sedation because of desaturations.  When she went home she was worse and presented to urgent care and then to [facility] hospital.  She had a CT negative for PE.  However, there was some suggestion of CHF.  She was [facility] to [facility].  I have reviewed all of these records.  She had an echo with perhaps mild diastolic dysfunction.  She did have a mildly elevated BNP.  She had negative cardiac enzymes.  She presents for further evaluation.  She says that she had been getting some DOE for 8 months with 2 pillow orthopnea over a couple of weeks.  She did have some chest heaviness which she notices when she is SOB.  This is upper chest and a soreness.  There has been no new PND.   There have been no reported palpitations, presyncope or syncope.  She is not particularly active although she walks 7 steps routinely.   No Known Allergies  Current Outpatient Prescriptions  Medication Sig Dispense Refill  . citalopram (CELEXA) 40 MG tablet Take 40 mg by mouth daily.      . furosemide (LASIX) 20 MG tablet Take 1 tablet (20 mg total) by mouth daily.  30 tablet  2  . indomethacin (INDOCIN) 50 MG capsule Take 50 mg by mouth 3 (three) times daily with meals.       . methocarbamol (ROBAXIN) 500 MG tablet Take 500 mg by mouth every 6 (six) hours as needed for muscle spasms.      Marland Kitchen. oxycodone (OXY-IR) 5 MG capsule Take 5-10 mg by mouth every 4 (four) hours as needed for pain.      . ranitidine (ZANTAC) 150 MG tablet Take 150 mg by mouth 2 (two) times daily.       No current facility-administered medications for this visit.    Past Medical History  Diagnosis Date  . Mood disorder     Past Surgical History  Procedure Laterality Date  . Rotator cuff repair        right  . Back surgery      lumbar  . Tonsillectomy and adenoidectomy      Family History  Problem Relation Age of Onset  . CAD Father 5233  . COPD Mother   . Heart disease Mother     Valve disease  . CAD Brother 50    Mild CAD    History   Social History  . Marital Status: Married    Spouse Name: N/A    Number of Children: 2  . Years of Education: N/A   Occupational History  . Not on file.   Social History Main Topics  . Smoking status: Former Smoker -- 1.50 packs/day for 20 years    Types: Cigarettes    Quit date: 07/31/1998  . Smokeless tobacco: Not on file  . Alcohol Use: Not on file  . Drug Use: Not on file  . Sexual Activity: Not on file   Other Topics Concern  . Not on file   Social History Narrative   Lives with husband.    ROS:  Positive for dizziness, mild difficulty swallowing back pain, shoulder pain. Otherwise as stated in the HPI and negative for all other  systems.  PHYSICAL EXAM BP 118/66  Pulse 69  Ht 5\' 5"  (1.651 m)  Wt 169 lb (76.658 kg)  BMI 28.12 kg/m2 GENERAL:  Well appearing HEENT:  Pupils equal round and reactive, fundi not visualized, oral mucosa unremarkable NECK:  No jugular venous distention, waveform within normal limits, carotid upstroke brisk and symmetric, no bruits, no thyromegaly LYMPHATICS:  No cervical, inguinal adenopathy LUNGS:  Clear to auscultation bilaterally BACK:  No CVA tenderness CHEST:  Unremarkable HEART:  PMI not displaced or sustained,S1 and S2 within normal limits, no S3, no S4, no clicks, no rubs, no murmurs ABD:  Flat, positive bowel sounds normal in frequency in pitch, no bruits, no rebound, no guarding, no midline pulsatile mass, no hepatomegaly, no splenomegaly EXT:  2 plus pulses throughout, no edema, no cyanosis no clubbing SKIN:  No rashes no nodules NEURO:  Cranial nerves II through XII grossly intact, motor grossly intact throughout PSYCH:  Cognitively intact, oriented to person place and  time   EKG:  Sinus rhythm, rate 69, axis within normal limits, intervals within normal limits, no acute ST-T wave changes.   ASSESSMENT AND PLAN   DYSPNEA:  There is likely to be somnolent of diastolic dysfunction mild. I would also like to exclude ischemia given her risk factors. When she is able to move her arm a little bit better I will bring her back for [employer]. She would not be able to walk on a treadmill. In the meantime we talked about salt and fluid restriction. She will continue her diuretic. As she is on Lasix I will check a basic metabolic profile. She will also proceed with sleep apnea evaluation which was suggested.  FAMILY HISTORY CAD:  She has not had a screening lipid profile I will check one of these.

## 2013-08-17 NOTE — Patient Instructions (Signed)
The current medical regimen is effective;  continue present plan and medications.  Please have blood work today (BMP and Lipid)  Follow up with Dr Antoine PocheHochrein in 6 weeks.

## 2013-08-26 ENCOUNTER — Ambulatory Visit (HOSPITAL_COMMUNITY)
Admission: RE | Admit: 2013-08-26 | Discharge: 2013-08-26 | Disposition: A | Discharge status: Home / Self Care | Payer: BC Managed Care – PPO | Source: Ambulatory Visit | Attending: Critical Care Medicine | Admitting: Critical Care Medicine

## 2013-08-26 ENCOUNTER — Encounter: Payer: Self-pay | Admitting: Critical Care Medicine

## 2013-08-26 ENCOUNTER — Ambulatory Visit (INDEPENDENT_AMBULATORY_CARE_PROVIDER_SITE_OTHER): Payer: BC Managed Care – PPO | Admitting: Critical Care Medicine

## 2013-08-26 VITALS — BP 104/76 | HR 79 | Temp 98.1°F | Ht 62.5 in | Wt 168.2 lb

## 2013-08-26 DIAGNOSIS — J438 Other emphysema: Secondary | ICD-10-CM

## 2013-08-26 DIAGNOSIS — R0989 Other specified symptoms and signs involving the circulatory and respiratory systems: Secondary | ICD-10-CM

## 2013-08-26 DIAGNOSIS — R0609 Other forms of dyspnea: Secondary | ICD-10-CM | POA: Insufficient documentation

## 2013-08-26 DIAGNOSIS — J439 Emphysema, unspecified: Secondary | ICD-10-CM

## 2013-08-26 DIAGNOSIS — R062 Wheezing: Secondary | ICD-10-CM | POA: Insufficient documentation

## 2013-08-26 DIAGNOSIS — Z87891 Personal history of nicotine dependence: Secondary | ICD-10-CM | POA: Insufficient documentation

## 2013-08-26 DIAGNOSIS — Z23 Encounter for immunization: Secondary | ICD-10-CM

## 2013-08-26 LAB — PULMONARY FUNCTION TEST
DL/VA % PRED: 54 %
DL/VA: 2.46 ml/min/mmHg/L
DLCO unc % pred: 57 %
DLCO unc: 12.43 ml/min/mmHg
FEF 25-75 POST: 1.05 L/s
FEF 25-75 Pre: 0.8 L/sec
FEF2575-%CHANGE-POST: 31 %
FEF2575-%PRED-PRE: 32 %
FEF2575-%Pred-Post: 42 %
FEV1-%Change-Post: 10 %
FEV1-%PRED-POST: 70 %
FEV1-%PRED-PRE: 63 %
FEV1-POST: 1.75 L
FEV1-PRE: 1.58 L
FEV1FVC-%CHANGE-POST: 2 %
FEV1FVC-%PRED-PRE: 72 %
FEV6-%Change-Post: 6 %
FEV6-%PRED-PRE: 88 %
FEV6-%Pred-Post: 94 %
FEV6-Post: 2.91 L
FEV6-Pre: 2.73 L
FEV6FVC-%Change-Post: -1 %
FEV6FVC-%Pred-Post: 101 %
FEV6FVC-%Pred-Pre: 102 %
FVC-%CHANGE-POST: 7 %
FVC-%Pred-Post: 93 %
FVC-%Pred-Pre: 86 %
FVC-Post: 2.97 L
FVC-Pre: 2.76 L
POST FEV1/FVC RATIO: 59 %
PRE FEV1/FVC RATIO: 57 %
PRE FEV6/FVC RATIO: 99 %
Post FEV6/FVC ratio: 98 %
RV % PRED: 134 %
RV: 2.4 L
TLC % pred: 114 %
TLC: 5.45 L

## 2013-08-26 MED ORDER — FLUTICASONE FUROATE-VILANTEROL 100-25 MCG/INH IN AEPB: 1.0000 | INHALATION_SPRAY | Freq: Every day | RESPIRATORY_TRACT | Status: DC

## 2013-08-26 MED ORDER — ALBUTEROL SULFATE (2.5 MG/3ML) 0.083% IN NEBU
2.5000 mg | INHALATION_SOLUTION | Freq: Once | RESPIRATORY_TRACT | Status: AC
  Administered 2013-08-26: 2.5 mg via RESPIRATORY_TRACT

## 2013-08-26 MED ORDER — PREDNISONE 10 MG PO TABS: ORAL_TABLET | ORAL | Status: DC

## 2013-08-26 NOTE — Progress Notes (Signed)
Subjective:    Patient ID: [name], female    DOB: 04/08/1956, 58 y.o.   MRN: [id number]  HPI Comments: Hx of dyspnea for 8months rest/exertion Worse since surgery.  CT Angio Chest NEG for PE  Shortness of Breath This is a new problem. The current episode started more than 1 month ago. The problem occurs constantly. The problem has been gradually worsening. Associated symptoms include headaches, leg swelling, neck pain, orthopnea, a sore throat, sputum production and wheezing. Pertinent negatives include no abdominal pain, chest pain, claudication, coryza, ear pain, fever, hemoptysis, leg pain, PND, rash, rhinorrhea, swollen glands, syncope or vomiting. The symptoms are aggravated by any activity, emotional upset, exercise, fumes, odors, lying flat, eating and weather changes. Associated symptoms comments: Notes some sinus pressure and pndrip. Risk factors include smoking. She has tried ipratropium inhalers, beta agonist inhalers and steroid inhalers (combivent/ breo done 08/18/13: improved) for the symptoms. Her past medical history is significant for asthma, a heart failure and a recent surgery. There is no history of allergies, aspirin allergies, bronchiolitis, CAD, chronic lung disease, COPD, DVT, PE or pneumonia. [facility]. pt had shoulder surgery, desat, then went home only to return to ED 07/30/12 then [facility]. )    Past Medical History  Diagnosis Date  . Mood disorder   . SOB (shortness of breath)   . GERD (gastroesophageal reflux disease)   . CHF (congestive heart failure)   . Arthritis      Family History  Problem Relation Age of Onset  . CAD Father 8733  . COPD Mother   . Heart disease Mother     Valve disease  . CAD Brother 50    Mild CAD  . Emphysema Paternal Grandfather   . Allergies Mother   . Asthma Mother      History   Social History  . Marital Status: Married    Spouse Name: N/A    Number of Children: 2  . Years of Education: N/A   Occupational  History  . Realtor    Social History Main Topics  . Smoking status: Former Smoker -- 2.00 packs/day for 15 years    Types: Cigarettes    Quit date: 07/30/1996  . Smokeless tobacco: Never Used  . Alcohol Use: No  . Drug Use: No  . Sexual Activity: Not on file   Other Topics Concern  . Not on file   Social History Narrative   Lives with husband.     No Known Allergies   Outpatient Prescriptions Prior to Visit  Medication Sig Dispense Refill  . citalopram (CELEXA) 40 MG tablet Take 40 mg by mouth daily.      . furosemide (LASIX) 20 MG tablet Take 1 tablet (20 mg total) by mouth daily.  30 tablet  2  . indomethacin (INDOCIN) 50 MG capsule Take 50 mg by mouth 3 (three) times daily with meals.       . methocarbamol (ROBAXIN) 500 MG tablet Take 500 mg by mouth every 6 (six) hours as needed for muscle spasms.      Marland Kitchen. oxycodone (OXY-IR) 5 MG capsule Take 5-10 mg by mouth every 4 (four) hours as needed for pain.      . ranitidine (ZANTAC) 150 MG tablet Take 150 mg by mouth 2 (two) times daily.       No facility-administered medications prior to visit.    Review of Systems  Constitutional: Positive for activity change, appetite change, fatigue and unexpected weight change.  Negative for fever, chills and diaphoresis.  HENT: Positive for congestion, facial swelling, sinus pressure, sore throat, tinnitus and voice change. Negative for dental problem, ear discharge, ear pain, hearing loss, mouth sores, nosebleeds, postnasal drip, rhinorrhea, sneezing and trouble swallowing.   Eyes: Positive for visual disturbance. Negative for photophobia, discharge and itching.  Respiratory: Positive for cough, sputum production, chest tightness, shortness of breath and wheezing. Negative for apnea, hemoptysis, choking and stridor.   Cardiovascular: Positive for palpitations, orthopnea and leg swelling. Negative for chest pain, claudication, syncope and PND.  Gastrointestinal: Positive for nausea. Negative  for vomiting, abdominal pain, constipation, blood in stool and abdominal distention.  Genitourinary: Negative for dysuria, urgency, frequency, hematuria, flank pain, decreased urine volume and difficulty urinating.  Musculoskeletal: Positive for arthralgias, back pain, gait problem, joint swelling, myalgias, neck pain and neck stiffness.  Skin: Positive for pallor. Negative for color change and rash.  Neurological: Positive for dizziness, syncope, weakness, light-headedness and headaches. Negative for tremors, seizures, speech difficulty and numbness.  Hematological: Negative for adenopathy. Does not bruise/bleed easily.  Psychiatric/Behavioral: Positive for sleep disturbance. Negative for confusion and agitation. The patient is not nervous/anxious.        Objective:   Physical Exam  Filed Vitals:   08/26/13 1132  BP: 104/76  Pulse: 79  Temp: 98.1 F (36.7 C)  TempSrc: Oral  Height: 5' 2.5" (1.588 m)  Weight: 168 lb 3.2 oz (76.295 kg)  SpO2: 94%    Gen: Pleasant, well-nourished, in no distress,  normal affect  ENT: No lesions,  mouth clear,  oropharynx clear, no postnasal drip  Neck: No JVD, no TMG, no carotid bruits  Lungs: No use of accessory muscles, no dullness to percussion, distant BS  Cardiovascular: RRR, heart sounds normal, no murmur or gallops, no peripheral edema  Abdomen: soft and NT, no HSM,  BS normal  Musculoskeletal: No deformities, no cyanosis or clubbing  Neuro: alert, non focal  Skin: Warm, no lesions or rashes  No results found.  CT Chest : centrilobular emphysema NO PE  PFTs: moderate obstruction, improved with BD, Mod reduction DLCO. 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  Cholecalciferol (HM VITAMIN D3) 4000 UNITS CAPS Take by mouth daily.  . citalopram (CELEXA) 40 MG tablet Take 40 mg by mouth daily.  . COMBIVENT RESPIMAT 20-100 MCG/ACT AERS respimat Inhale 1 puff into the lungs every 6 (six) hours as needed.  . Fluticasone Furoate-Vilanterol (BREO ELLIPTA) 100-25 MCG/INH AEPB Inhale 1 puff into the lungs daily.  . furosemide (LASIX) 20 MG tablet Take 1 tablet (20 mg total) by mouth daily.  . indomethacin (INDOCIN) 50 MG capsule Take 50 mg by mouth 3 (three) times daily with meals.   . methocarbamol (ROBAXIN) 500 MG tablet Take 500 mg by mouth every 6 (six) hours as needed for muscle spasms.  Marland Kitchen omeprazole (PRILOSEC) 20 MG capsule Take 1 capsule by mouth daily.  Marland Kitchen OVER THE COUNTER MEDICATION Tumeric twice daily  . oxycodone (OXY-IR) 5 MG capsule Take 5-10 mg by mouth every 4 (four) hours as needed for pain.  . traMADol (ULTRAM) 50 MG tablet Take 2 tablets by mouth every 6 (six) hours as needed.  . [  DISCONTINUED] Fluticasone Furoate-Vilanterol (BREO ELLIPTA) 100-25 MCG/INH AEPB Inhale 1 puff into the lungs daily.  . predniSONE (DELTASONE) 10 MG tablet Take 40 mg x 3days, 30mg  x 3 days, 20 mg x 3 days, 10 mg x 3 days then stop  . [DISCONTINUED] ranitidine (ZANTAC) 150 MG tablet Take 150 mg by mouth 2 (two) times daily.

## 2013-08-26 NOTE — Patient Instructions (Signed)
Take prednisone 10 mg 4 x 3 days, 3 x 3 days, 2 x 3 days, 1 x 3 days then stop We will schedule you for a breathing study -- [facility].  Please arrive at the Admitting Office at 3:30. Ambulatory pulse oximetry Continue with Breo 1 puff daily Follow up with Dr. Delford FieldWright in 1 [place]

## 2013-08-27 NOTE — Assessment & Plan Note (Addendum)
Gold B Copd with emphysema, mild exacerbation Plan Take prednisone 10 mg 4 x 3 days, 3 x 3 days, 2 x 3 days, 1 x 3 days then stop Cont breo

## 2013-08-31 ENCOUNTER — Encounter: Payer: Self-pay | Admitting: *Deleted

## 2013-08-31 NOTE — Progress Notes (Signed)
Quick Note:  Called, spoke with pt. Informed her of results and recs per Dr. Delford FieldWright. She verbalized understanding. ______

## 2013-09-03 ENCOUNTER — Telehealth: Payer: Self-pay | Admitting: Critical Care Medicine

## 2013-09-03 NOTE — Telephone Encounter (Signed)
Pt has a ROV sched w/ PW in [place] on 09/22/13 at 2:30.  Nothing further needed at this time.  Jo FairyHolly D Pryor

## 2013-09-09 ENCOUNTER — Ambulatory Visit (HOSPITAL_BASED_OUTPATIENT_CLINIC_OR_DEPARTMENT_OTHER): Payer: BC Managed Care – PPO | Attending: Family Medicine | Admitting: Radiology

## 2013-09-09 VITALS — Ht 62.0 in | Wt 165.0 lb

## 2013-09-09 DIAGNOSIS — G4733 Obstructive sleep apnea (adult) (pediatric): Secondary | ICD-10-CM

## 2013-09-12 DIAGNOSIS — G4733 Obstructive sleep apnea (adult) (pediatric): Secondary | ICD-10-CM

## 2013-09-12 NOTE — Sleep Study (Signed)
   NAME: Jo LaughterLinda Ess DATE OF BIRTH:  Dec 09, 1955 MEDICAL RECORD NUMBER [id number]  LOCATION: [facility]  PHYSICIAN: YOUNG,CLINTON D  DATE OF STUDY: 09/09/2013  SLEEP STUDY TYPE: Nocturnal Polysomnogram               REFERRING PHYSICIAN: Meredeth IdeLama, Gagan S, MD  INDICATION FOR STUDY: Hypersomnia with sleep apnea  EPWORTH SLEEPINESS SCORE: 4/24 HEIGHT: 5\' 2"  (157.5 cm)  WEIGHT: 165 lb (74.844 kg)    Body mass index is 30.17 kg/(m^2).  NECK SIZE: 15 in.  MEDICATIONS: Charted for review  SLEEP ARCHITECTURE: Total sleep time 259.5 minutes with sleep efficiency 62%. Stage I was 5%, stage II 55.5%, stage III 11.9%, REM 27.6% of total sleep time. Sleep latency 146 minutes, REM latency 99 minutes, awake after sleep onset 13 minutes, arousal index 44.4. Bedtime medication: None  RESPIRATORY DATA: Apnea hypopnea index (AHI) 12 per hour. 52 events scored including 39 obstructive apneas, 2 central apneas, 11 hypopneas. Events were more common while nonsupine. REM AHI 30.2 per hour. Delayed sleep onset prevented application of split protocol CPAP titration.  OXYGEN DATA: Moderate snoring with oxygen desaturation to a nadir of 89% and mean oxygen saturation through the study of 91.8% on room air.  CARDIAC DATA: Sinus rhythm with occasional PVC  MOVEMENT/PARASOMNIA: Periodic limb movement syndrome. A total of 123 limb jerks were counted of which 25 were associated with arousal or wakening for periodic limb movement with arousal index of 5.8 per hour. No bathroom trips  IMPRESSION/ RECOMMENDATION:   1) Mild obstructive sleep apnea/hypopnea syndrome, AHI 12 per hour with events in all sleep positions. Moderate snoring with oxygen desaturation to a nadir of 89% and mean oxygen saturation through the study of 91.8% on room air.  2) sleep onset was delayed until after midnight with no bedtime medication taken. This prevented application of split protocol CPAP titration. This patient can  return for dedicated CPAP titration study if appropriate. 3) Video review shows the patient wearing a sling on her right arm. She slept mostly supine and on her left side. Implication for sleep comfort and relation to delay in sleep onset are unknown.   Signed, Jetty Duhamellinton Young M.D. Waymon BudgeYOUNG,CLINTON D Diplomate, American Board of Sleep Medicine  ELECTRONICALLY SIGNED ON:  09/12/2013, 11:51 AM [facility] PH: [phone]   FX: [phone] ACCREDITED BY THE AMERICAN ACADEMY OF SLEEP MEDICINE

## 2013-09-22 ENCOUNTER — Ambulatory Visit: Payer: BC Managed Care – PPO | Admitting: Adult Health

## 2013-09-24 ENCOUNTER — Ambulatory Visit: Payer: BC Managed Care – PPO | Admitting: Adult Health

## 2013-09-29 ENCOUNTER — Ambulatory Visit (INDEPENDENT_AMBULATORY_CARE_PROVIDER_SITE_OTHER): Payer: BC Managed Care – PPO | Admitting: Cardiology

## 2013-09-29 ENCOUNTER — Encounter: Payer: Self-pay | Admitting: Cardiology

## 2013-09-29 VITALS — BP 112/80 | HR 78 | Ht 64.0 in | Wt 165.0 lb

## 2013-09-29 DIAGNOSIS — I509 Heart failure, unspecified: Secondary | ICD-10-CM

## 2013-09-29 DIAGNOSIS — N182 Chronic kidney disease, stage 2 (mild): Secondary | ICD-10-CM

## 2013-09-29 DIAGNOSIS — I5032 Chronic diastolic (congestive) heart failure: Secondary | ICD-10-CM

## 2013-09-29 MED ORDER — FUROSEMIDE 20 MG PO TABS: 20.0000 mg | ORAL_TABLET | Freq: Every day | ORAL | Status: AC

## 2013-09-29 NOTE — Patient Instructions (Signed)
The current medical regimen is effective;  continue present plan and medications.  Follow up with Dr Antoine PocheHochrein in 2 months.

## 2013-09-29 NOTE — Progress Notes (Signed)
HPI The patient was recently admitted with dyspnea.  This is extensively described in the previous note. I had considered doing a stress test but off on doing this because of a shoulder injury and the fact that she has had this arm immobilized and would not be removed at for imaging and would not be a walk on a treadmill.  Since I last saw her her breathing is a little bit better. She notices if she doesn't take her Lasix if she gets some swelling and perhaps increased dyspnea. She's not however had any new chest discomfort, neck or arm discomfort. She's not had any new palpitations, presyncope or syncope. She's not describing any PND or orthopnea. She is able to tolerate her physical therapy.  No Known Allergies  Current Outpatient Prescriptions  Medication Sig Dispense Refill  . Cholecalciferol (HM VITAMIN D3) 4000 UNITS CAPS Take by mouth daily.      . citalopram (CELEXA) 40 MG tablet Take 40 mg by mouth daily.      . COMBIVENT RESPIMAT 20-100 MCG/ACT AERS respimat Inhale 1 puff into the lungs every 6 (six) hours as needed.      . Fluticasone Furoate-Vilanterol (BREO ELLIPTA) 100-25 MCG/INH AEPB Inhale 1 puff into the lungs daily.  1 each  6  . furosemide (LASIX) 20 MG tablet Take 1 tablet (20 mg total) by mouth daily.  30 tablet  2  . indomethacin (INDOCIN) 50 MG capsule Take 50 mg by mouth 3 (three) times daily with meals.       . methocarbamol (ROBAXIN) 500 MG tablet Take 750 mg by mouth every 6 (six) hours as needed for muscle spasms.       Marland Kitchen omeprazole (PRILOSEC) 20 MG capsule Take 1 capsule by mouth daily.      Marland Kitchen OVER THE COUNTER MEDICATION Tumeric twice daily      . traMADol (ULTRAM) 50 MG tablet Take 2 tablets by mouth every 6 (six) hours as needed.       No current facility-administered medications for this visit.    Past Medical History  Diagnosis Date  . Mood disorder   . SOB (shortness of breath)   . GERD (gastroesophageal reflux disease)   . CHF (congestive heart  failure)   . Arthritis     Past Surgical History  Procedure Laterality Date  . Rotator cuff repair      right  . Back surgery      lumbar  . Tonsillectomy and adenoidectomy    . Tubal ligation       ROS:  Positive for dizziness, mild difficulty swallowing back pain, shoulder pain. Otherwise as stated in the HPI and negative for all other systems.  PHYSICAL EXAM BP 112/80  Pulse 78  Ht 5\' 4"  (1.626 m)  Wt 165 lb (74.844 kg)  BMI 28.31 kg/m2 GENERAL:  Well appearing NECK:  No jugular venous distention, waveform within normal limits, carotid upstroke brisk and symmetric, no bruits, no thyromegaly LUNGS:  Clear to auscultation bilaterally HEART:  PMI not displaced or sustained,S1 and S2 within normal limits, no S3, no S4, no clicks, no rubs, no murmurs ABD:  Flat, positive bowel sounds normal in frequency in pitch, no bruits, no rebound, no guarding, no midline pulsatile mass, no hepatomegaly, no splenomegaly EXT:  2 plus pulses throughout, no edema, no cyanosis no clubbing   ASSESSMENT AND PLAN  DYSPNEA:  The patient has no new symptoms.  She is not currently having chest pain.  I have held off on stress testing because of her orthopedic injury.  She would not be able to tolerate moving her arm for any kind of imaging.  I will reassess in a couple of months to see if she has any further symptoms and whether stress testing is indicated.  She will remain on the Lasix to treat some element of diastolic dysfunction.

## 2013-10-16 ENCOUNTER — Ambulatory Visit (INDEPENDENT_AMBULATORY_CARE_PROVIDER_SITE_OTHER): Payer: BC Managed Care – PPO | Admitting: Critical Care Medicine

## 2013-10-16 ENCOUNTER — Encounter: Payer: Self-pay | Admitting: Critical Care Medicine

## 2013-10-16 ENCOUNTER — Other Ambulatory Visit: Payer: BC Managed Care – PPO

## 2013-10-16 VITALS — BP 122/84 | HR 96 | Temp 97.5°F | Ht 62.5 in | Wt 164.0 lb

## 2013-10-16 DIAGNOSIS — J439 Emphysema, unspecified: Secondary | ICD-10-CM

## 2013-10-16 DIAGNOSIS — J438 Other emphysema: Secondary | ICD-10-CM

## 2013-10-16 DIAGNOSIS — G479 Sleep disorder, unspecified: Secondary | ICD-10-CM

## 2013-10-16 MED ORDER — MOMETASONE FUROATE 50 MCG/ACT NA SUSP: 2.0000 | Freq: Every day | NASAL | Status: DC

## 2013-10-16 NOTE — Patient Instructions (Signed)
A blood test for alpha one antitrypsin assay will be obtained A referral to pulm rehab in [place] will be obtained Renewal on nasonex sent Stay on breo Return 4 months [place]

## 2013-10-17 NOTE — Progress Notes (Signed)
Patient ID: Jo LaughterLinda Owens, female   DOB: [birth year], 58 y.o.   MRN: [id number]   Subjective:    Patient ID: Jo LaughterLinda Owens, female    DOB: [birth year], 58 y.o.   MRN: [id number]  HPI 10/16/13 Chief Complaint  Patient presents with  . 2 month follow up    Breathing has improved.  Does have DOE.  Cough with small amount of off white mucus.  Chest heaviness x 1 wk.   Since the last office visit the patient is improved. There is some dyspnea on exertion. The patient's tolerate inhaled medications well Pt denies any significant sore throat, nasal congestion or excess secretions, fever, chills, sweats, unintended weight loss, pleurtic or exertional chest pain, orthopnea PND, or leg swelling Pt denies any increase in rescue therapy over baseline, denies waking up needing it or having any early am or nocturnal exacerbations of coughing/wheezing/or dyspnea. Pt also denies any obvious fluctuation in symptoms with  weather or environmental change or other alleviating or aggravating factors   Past Medical History  Diagnosis Date  . Mood disorder   . SOB (shortness of breath)   . GERD (gastroesophageal reflux disease)   . CHF (congestive heart failure)   . Arthritis      Family History  Problem Relation Age of Onset  . CAD Father 5933  . COPD Mother   . Heart disease Mother     Valve disease  . CAD Brother 50    Mild CAD  . Emphysema Paternal Grandfather   . Allergies Mother   . Asthma Mother      History   Social History  . Marital Status: Married    Spouse Name: N/A    Number of Children: 2  . Years of Education: N/A   Occupational History  . Realtor    Social History Main Topics  . Smoking status: Former Smoker -- 2.00 packs/day for 15 years    Types: Cigarettes    Quit date: 07/30/1996  . Smokeless tobacco: Never Used  . Alcohol Use: No  . Drug Use: No  . Sexual Activity: Not on file   Other Topics Concern  . Not on file   Social History Narrative   Lives with husband.      No Known Allergies   Outpatient Prescriptions Prior to Visit  Medication Sig Dispense Refill  . Cholecalciferol (HM VITAMIN D3) 4000 UNITS CAPS Take by mouth daily.      . citalopram (CELEXA) 40 MG tablet Take 40 mg by mouth daily.      . COMBIVENT RESPIMAT 20-100 MCG/ACT AERS respimat Inhale 1 puff into the lungs every 6 (six) hours as needed.      . Fluticasone Furoate-Vilanterol (BREO ELLIPTA) 100-25 MCG/INH AEPB Inhale 1 puff into the lungs daily.  1 each  6  . furosemide (LASIX) 20 MG tablet Take 1 tablet (20 mg total) by mouth daily.  90 tablet  3  . indomethacin (INDOCIN) 50 MG capsule Take 50 mg by mouth 3 (three) times daily with meals.       . methocarbamol (ROBAXIN) 500 MG tablet Take 750 mg by mouth 2 (two) times daily.       Marland Kitchen. omeprazole (PRILOSEC) 20 MG capsule Take 1 capsule by mouth daily.      Marland Kitchen. OVER THE COUNTER MEDICATION Tumeric twice daily      . traMADol (ULTRAM) 50 MG tablet Take 2 tablets by mouth 2 (two) times daily.        No  facility-administered medications prior to visit.    Review of Systems  Constitutional: Positive for activity change, appetite change and fatigue. Negative for chills, diaphoresis and unexpected weight change.  HENT: Positive for congestion and sinus pressure. Negative for dental problem, ear discharge, facial swelling, hearing loss, mouth sores, nosebleeds, postnasal drip, sneezing, tinnitus, trouble swallowing and voice change.   Eyes: Positive for visual disturbance. Negative for photophobia, discharge and itching.  Respiratory: Negative for apnea, cough, choking, chest tightness and stridor.   Cardiovascular: Negative for palpitations.  Gastrointestinal: Negative for nausea, constipation, blood in stool and abdominal distention.  Genitourinary: Negative for dysuria, urgency, frequency, hematuria, flank pain, decreased urine volume and difficulty urinating.  Musculoskeletal: Negative for arthralgias, back pain, gait problem, joint  swelling and neck stiffness.  Skin: Negative for color change and pallor.  Neurological: Positive for light-headedness. Negative for dizziness, tremors, seizures, syncope, speech difficulty, weakness and numbness.  Hematological: Negative for adenopathy. Does not bruise/bleed easily.  Psychiatric/Behavioral: Positive for sleep disturbance. Negative for confusion and agitation. The patient is not nervous/anxious.        Objective:   Physical Exam  Filed Vitals:   10/16/13 1420  BP: 122/84  Pulse: 96  Temp: 97.5 F (36.4 C)  TempSrc: Oral  Height: 5' 2.5" (1.588 m)  Weight: 74.39 kg (164 lb)  SpO2: 96%    Gen: Pleasant, well-nourished, in no distress,  normal affect  ENT: No lesions,  mouth clear,  oropharynx clear, no postnasal drip  Neck: No JVD, no TMG, no carotid bruits  Lungs: No use of accessory muscles, no dullness to percussion, distant BS  Cardiovascular: RRR, heart sounds normal, no murmur or gallops, no peripheral edema  Abdomen: soft and NT, no HSM,  BS normal  Musculoskeletal: No deformities, no cyanosis or clubbing  Neuro: alert, non focal  Skin: Warm, no lesions or rashes  No results found.    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 Cholecalciferol (HM VITAMIN D3) 4000 UNITS CAPS Take by mouth daily.  . citalopram (CELEXA) 40 MG tablet Take 40 mg by mouth daily.  . COMBIVENT RESPIMAT 20-100 MCG/ACT AERS respimat Inhale 1 puff into the lungs every 6 (six) hours as needed.  . Fluticasone Furoate-Vilanterol (BREO ELLIPTA) 100-25 MCG/INH AEPB Inhale 1 puff into the lungs daily.  . furosemide (LASIX) 20  MG tablet Take 1 tablet (20 mg total) by mouth daily.  . indomethacin (INDOCIN) 50 MG capsule Take 50 mg by mouth 3 (three) times daily with meals.   . methocarbamol (ROBAXIN) 500 MG tablet Take 750 mg by mouth 2 (two) times daily.   Marland Kitchen omeprazole (PRILOSEC) 20 MG capsule Take 1 capsule by mouth daily.  Marland Kitchen OVER THE COUNTER MEDICATION Tumeric twice daily  . traMADol (ULTRAM) 50 MG tablet Take 2 tablets by mouth 2 (two) times daily.   . mometasone (NASONEX) 50 MCG/ACT nasal spray Place 2 sprays into the nose daily.

## 2013-10-17 NOTE — Assessment & Plan Note (Signed)
Gold stage B. COPD with moderate air trapping improved on Breo Plan A blood test for alpha one antitrypsin assay will be obtained A referral to pulm rehab in [place] will be obtained Renewal on nasonex sent Stay on breo Return 4 months 

## 2013-10-21 LAB — ALPHA-1-ANTITRYPSIN: A-1 Antitrypsin, Ser: 137 mg/dL (ref 83–199)

## 2013-10-21 NOTE — Progress Notes (Signed)
Quick Note:  Call pt and tell her alpha one antitrypsin level is normal. No deficiency seen ______

## 2013-10-22 NOTE — Progress Notes (Signed)
Quick Note:  lmomtcb for pt ______ 

## 2013-10-22 NOTE — Progress Notes (Signed)
Quick Note:  Called, spoke with pt. Informed her of lab results per Dr. Wright. She verbalized understanding and voiced no further questions or concerns at this time. ______ 

## 2013-10-29 LAB — ALPHA-1 ANTITRYPSIN PHENOTYPE: A1 ANTITRYPSIN: 128 mg/dL (ref 83–199)

## 2013-12-01 ENCOUNTER — Ambulatory Visit: Payer: BC Managed Care – PPO | Admitting: Cardiology

## 2013-12-25 ENCOUNTER — Ambulatory Visit: Payer: BC Managed Care – PPO | Admitting: Cardiology

## 2014-02-08 ENCOUNTER — Ambulatory Visit: Payer: BC Managed Care – PPO | Admitting: Cardiology

## 2014-02-16 ENCOUNTER — Encounter: Payer: Self-pay | Admitting: Cardiology

## 2014-05-03 ENCOUNTER — Other Ambulatory Visit: Payer: Self-pay | Admitting: Critical Care Medicine

## 2014-05-18 ENCOUNTER — Ambulatory Visit (INDEPENDENT_AMBULATORY_CARE_PROVIDER_SITE_OTHER): Payer: BC Managed Care – PPO | Admitting: Critical Care Medicine

## 2014-05-18 ENCOUNTER — Ambulatory Visit (INDEPENDENT_AMBULATORY_CARE_PROVIDER_SITE_OTHER): Payer: BC Managed Care – PPO

## 2014-05-18 ENCOUNTER — Encounter: Payer: Self-pay | Admitting: Critical Care Medicine

## 2014-05-18 VITALS — BP 116/74 | HR 76 | Temp 98.2°F | Ht 65.0 in | Wt 165.8 lb

## 2014-05-18 DIAGNOSIS — J432 Centrilobular emphysema: Secondary | ICD-10-CM

## 2014-05-18 DIAGNOSIS — Z23 Encounter for immunization: Secondary | ICD-10-CM

## 2014-05-18 MED ORDER — FLUTICASONE PROPIONATE 50 MCG/ACT NA SUSP: 2.0000 | Freq: Every day | NASAL | Status: DC

## 2014-05-18 MED ORDER — MOMETASONE FUROATE 50 MCG/ACT NA SUSP: 2.0000 | Freq: Every day | NASAL | Status: DC

## 2014-05-18 NOTE — Assessment & Plan Note (Signed)
Copd Gold B.  Emphysematous . Plan  Referral to pulmonary rehab was made Stay on nasonex Stay on breo /combivent A flu vaccine was given  Return 4 months

## 2014-05-18 NOTE — Patient Instructions (Signed)
Referral to pulmonary rehab was made Stay on nasonex Stay on breo /combivent A flu vaccine was given  Return 4 months

## 2014-05-18 NOTE — Progress Notes (Signed)
Patient ID: [name], female   DOB: 1956/03/05, 58 y.o.   MRN: [id number]   Subjective:    Patient ID: [name], female    DOB: 1956/03/05, 58 y.o.   MRN: [id number]  HPI 05/18/2014 Chief Complaint  Patient presents with  . Follow-up    Increased DOE x 2 months.  PND x 2 days.  Nasal congestion and cough with small amount of white mucus present but has improved.  No wheezing or chest tightness/pain.  Notes more DOE x 2 months.  Pt is doing more activity.  Not on Breo.  Not able to do pulm rehab in 10/2013.  Pt denies any significant sore throat, nasal congestion or excess secretions, fever, chills, sweats, unintended weight loss, pleurtic or exertional chest pain, orthopnea PND, or leg swelling Pt denies any increase in rescue therapy over baseline, denies waking up needing it or having any early am or nocturnal exacerbations of coughing/wheezing/or dyspnea. Pt also denies any obvious fluctuation in symptoms with  weather or environmental change or other alleviating or aggravating factors  Review of Systems  Constitutional: Positive for activity change, appetite change and fatigue. Negative for chills, diaphoresis and unexpected weight change.  HENT: Positive for congestion and sinus pressure. Negative for dental problem, ear discharge, facial swelling, hearing loss, mouth sores, nosebleeds, postnasal drip, sneezing, tinnitus, trouble swallowing and voice change.   Eyes: Positive for visual disturbance. Negative for photophobia, discharge and itching.  Respiratory: Negative for apnea, cough, choking, chest tightness and stridor.   Cardiovascular: Negative for palpitations.  Gastrointestinal: Negative for nausea, constipation, blood in stool and abdominal distention.  Genitourinary: Negative for dysuria, urgency, frequency, hematuria, flank pain, decreased urine volume and difficulty urinating.  Musculoskeletal: Negative for arthralgias, back pain, gait problem, joint swelling and neck  stiffness.  Skin: Negative for color change and pallor.  Neurological: Positive for light-headedness. Negative for dizziness, tremors, seizures, syncope, speech difficulty, weakness and numbness.  Hematological: Negative for adenopathy. Does not bruise/bleed easily.  Psychiatric/Behavioral: Positive for sleep disturbance. Negative for confusion and agitation. The patient is not nervous/anxious.        Objective:   Physical Exam   Filed Vitals:   05/18/14 1116  BP: 116/74  Pulse: 76  Temp: 98.2 F (36.8 C)  TempSrc: Oral  Height: 5\' 5"  (1.651 m)  Weight: 75.206 kg (165 lb 12.8 oz)  SpO2: 96%    Gen: Pleasant, well-nourished, in no distress,  normal affect  ENT: No lesions,  mouth clear,  oropharynx clear, no postnasal drip  Neck: No JVD, no TMG, no carotid bruits  Lungs: No use of accessory muscles, no dullness to percussion, distant BS  Cardiovascular: RRR, heart sounds normal, no murmur or gallops, no peripheral edema  Abdomen: soft and NT, no HSM,  BS normal  Musculoskeletal: No deformities, no cyanosis or clubbing  Neuro: alert, non focal  Skin: Warm, no lesions or rashes  No results found.    Assessment & Plan:   Emphysema/COPD Gold B Copd Gold B.  Emphysematous . Plan  Referral to pulmonary rehab was made Stay on nasonex Stay on breo /combivent A flu vaccine was given  Return 4 months     Updated Medication List Outpatient Encounter Prescriptions as of 05/18/2014  Medication Sig  . BREO ELLIPTA 100-25 MCG/INH AEPB INHALE 1 PUFF DAILY  . buPROPion (WELLBUTRIN SR) 100 MG 12 hr tablet Take 1 tablet by mouth daily.  . Cholecalciferol (HM VITAMIN D3) 4000 UNITS CAPS Take by mouth daily as  needed.   . citalopram (CELEXA) 40 MG tablet Take 40 mg by mouth daily.  . COMBIVENT RESPIMAT 20-100 MCG/ACT AERS respimat Inhale 2 puffs into the lungs daily.   . furosemide (LASIX) 20 MG tablet Take 1 tablet (20 mg total) by mouth daily.  Marland Kitchen. guaiFENesin (MUCINEX)  600 MG 12 hr tablet Take 600 mg by mouth 2 (two) times daily.  . indomethacin (INDOCIN) 50 MG capsule Take 75 mg by mouth 2 (two) times daily.   Marland Kitchen. omeprazole (PRILOSEC) 20 MG capsule Take 1 capsule by mouth daily.  Marland Kitchen. OVER THE COUNTER MEDICATION Tumeric twice daily  . PREMPRO 0.3-1.5 MG per tablet Take 1 tablet by mouth daily.  . traMADol (ULTRAM) 50 MG tablet Take 2 tablets by mouth 2 (two) times daily as needed.   . [DISCONTINUED] mometasone (NASONEX) 50 MCG/ACT nasal spray Place 2 sprays into the nose daily.  . [DISCONTINUED] mometasone (NASONEX) 50 MCG/ACT nasal spray Place 2 sprays into the nose daily as needed.  . mometasone (NASONEX) 50 MCG/ACT nasal spray Place 2 sprays into the nose daily.  . [DISCONTINUED] fluticasone (FLONASE) 50 MCG/ACT nasal spray Place 2 sprays into both nostrils daily.  . [DISCONTINUED] methocarbamol (ROBAXIN) 500 MG tablet Take 750 mg by mouth 2 (two) times daily.

## 2014-06-02 ENCOUNTER — Ambulatory Visit (INDEPENDENT_AMBULATORY_CARE_PROVIDER_SITE_OTHER): Payer: BC Managed Care – PPO | Admitting: Cardiology

## 2014-06-02 ENCOUNTER — Encounter: Payer: Self-pay | Admitting: Cardiology

## 2014-06-02 VITALS — BP 110/70 | HR 85 | Ht 65.0 in | Wt 163.0 lb

## 2014-06-02 DIAGNOSIS — I5032 Chronic diastolic (congestive) heart failure: Secondary | ICD-10-CM

## 2014-06-02 NOTE — Patient Instructions (Signed)
Schedule Lexiscan Myoview  Follow instructions given.Schedulers will call back to schedule    Follow up with Dr.Hochrein as needed

## 2014-06-02 NOTE — Progress Notes (Signed)
HPI The patient was recently admitted with dyspnea.  This is extensively described in the previous note. I had considered doing a stress test but off on doing this because of a shoulder injurySince I last saw her her breathing is a little actually getting worse. She says she's having increasing dyspnea with walking up a small incline going to her mother's house.. She notices if she doesn't take her Lasix if she gets some swelling and perhaps increased dyspnea. She's not however had any new chest discomfort, neck or arm discomfort. She's not had any new palpitations, presyncope or syncope. She's not describing any PND or orthopnea. She is able to tolerate her physical therapy.    No Known Allergies  Current Outpatient Prescriptions  Medication Sig Dispense Refill  . BREO ELLIPTA 100-25 MCG/INH AEPB INHALE 1 PUFF DAILY 60 each 3  . buPROPion (WELLBUTRIN SR) 100 MG 12 hr tablet Take 1 tablet by mouth daily.    . Cholecalciferol (HM VITAMIN D3) 4000 UNITS CAPS Take by mouth daily as needed.     . citalopram (CELEXA) 40 MG tablet Take 40 mg by mouth daily.    . COMBIVENT RESPIMAT 20-100 MCG/ACT AERS respimat Inhale 2 puffs into the lungs daily.     . furosemide (LASIX) 20 MG tablet Take 1 tablet (20 mg total) by mouth daily. 90 tablet 3  . guaiFENesin (MUCINEX) 600 MG 12 hr tablet Take 600 mg by mouth 2 (two) times daily.    . indomethacin (INDOCIN SR) 75 MG CR capsule Take 1 capsule by mouth 2 (two) times daily.  2  . mometasone (NASONEX) 50 MCG/ACT nasal spray Place 2 sprays into the nose daily. 17 g 11  . omeprazole (PRILOSEC) 20 MG capsule Take 1 capsule by mouth daily.    Marland Kitchen. OVER THE COUNTER MEDICATION Tumeric twice daily    . PREMPRO 0.3-1.5 MG per tablet Take 1 tablet by mouth daily.    . traMADol (ULTRAM) 50 MG tablet Take 2 tablets by mouth 2 (two) times daily as needed.      No current facility-administered medications for this visit.    Past Medical History  Diagnosis Date  .  Mood disorder   . SOB (shortness of breath)   . GERD (gastroesophageal reflux disease)   . CHF (congestive heart failure)   . Arthritis     Past Surgical History  Procedure Laterality Date  . Rotator cuff repair      right  . Back surgery      lumbar  . Tonsillectomy and adenoidectomy    . Tubal ligation       ROS:  Positive for dizziness, mild difficulty swallowing back pain, shoulder pain. Otherwise as stated in the HPI and negative for all other systems.  PHYSICAL EXAM BP 110/70 mmHg  Pulse 85  Ht 5\' 5"  (1.651 m)  Wt 163 lb (73.936 kg)  BMI 27.12 kg/m2 GENERAL:  Well appearing NECK:  No jugular venous distention, waveform within normal limits, carotid upstroke brisk and symmetric, no bruits, no thyromegaly LUNGS:  Clear to auscultation bilaterally HEART:  PMI not displaced or sustained,S1 and S2 within normal limits, no S3, no S4, no clicks, no rubs, no murmurs ABD:  Flat, positive bowel sounds normal in frequency in pitch, no bruits, no rebound, no guarding, no midline pulsatile mass, no hepatomegaly, no splenomegaly EXT:  2 plus pulses throughout, no edema, no cyanosis no clubbing  EKG:  Sinus rhythm, rate 85, axis within normal  limits, intervals within normal limits, no ST-T wave changes.  06/02/2014  ASSESSMENT AND PLAN  DYSPNEA:  The patient has increasing dyspnea with exertion despite being treated now for COPD. This could always be an anginal equivalent. I would like to walk around treadmill but she would not be able to do this. Therefore, to screen for obstructive coronary disease she will need a Lexiscan Myoview.If this is normal then no further cardiovascular testing will be plan.

## 2014-06-02 NOTE — Addendum Note (Signed)
Addended by: Meda KlinefelterPUGH, Damon Baisch JOHNSON D on: 06/02/2014 05:19 PM   Modules accepted: Orders

## 2014-06-03 ENCOUNTER — Telehealth: Payer: Self-pay | Admitting: Cardiology

## 2014-06-03 NOTE — Telephone Encounter (Signed)
Left voicemail for patient to call back and schedule her lexiscan myoview. ST

## 2014-06-07 ENCOUNTER — Telehealth (HOSPITAL_COMMUNITY): Payer: Self-pay | Admitting: *Deleted

## 2014-06-29 ENCOUNTER — Telehealth (HOSPITAL_COMMUNITY): Payer: Self-pay | Admitting: *Deleted

## 2014-09-15 ENCOUNTER — Telehealth: Payer: Self-pay | Admitting: *Deleted

## 2014-09-15 NOTE — Telephone Encounter (Signed)
Called pharmacy and advised them of PA approval.  Pharmacy ran medication through and it costs $160.  Patient cannot afford that.  She says she will try the OTC nasonex and see if it will work, she will call us back and let us know if it is not working.

## 2014-09-15 NOTE — Telephone Encounter (Signed)
PA initiated through [facility] [phone] Approved: 09/15/14 - 09/16/15 Authorize # [id number]  Nothing further needed.

## 2014-12-06 ENCOUNTER — Telehealth: Payer: Self-pay | Admitting: Cardiology

## 2014-12-06 NOTE — Telephone Encounter (Signed)
Received records from [facility] for appointment on 02/07/15 with Dr Antoine PocheHochrein.  [facility] Dr Hochrein's schedule on 02/07/15. lp

## 2015-02-07 ENCOUNTER — Encounter: Payer: Self-pay | Admitting: Cardiology

## 2015-02-07 ENCOUNTER — Ambulatory Visit (INDEPENDENT_AMBULATORY_CARE_PROVIDER_SITE_OTHER): Payer: 59 | Admitting: Cardiology

## 2015-02-07 VITALS — BP 128/80 | HR 87

## 2015-02-07 DIAGNOSIS — I2584 Coronary atherosclerosis due to calcified coronary lesion: Secondary | ICD-10-CM

## 2015-02-07 DIAGNOSIS — R0600 Dyspnea, unspecified: Secondary | ICD-10-CM

## 2015-02-07 DIAGNOSIS — I2510 Atherosclerotic heart disease of native coronary artery without angina pectoris: Secondary | ICD-10-CM | POA: Diagnosis not present

## 2015-02-07 DIAGNOSIS — I5032 Chronic diastolic (congestive) heart failure: Secondary | ICD-10-CM

## 2015-02-07 NOTE — Progress Notes (Signed)
HPI The patient has been seen multiple times for dyspnea. In January 2015 she had a well preserved systolic function on echo. She apparently has had some coronary calcium. She was to have a stress test when I saw her last to evaluate ongoing dyspnea. She does have some COPD but this was thought maybe not to explain all of her shortness of breath. However, she canceled this because she was caring for her mother who has since died. The patient reports she's getting short of breath with activities such as walking 10 yards on level ground. This is slowly progressive. She's not describing new PND or orthopnea. She's not having any new palpitations, presyncope or syncope. She's not having any chest pressure, neck or arm discomfort. She does have hand swelling but not feet swelling. She'll have more swelling and she doesn't take her diuretic.   No Known Allergies  Current Outpatient Prescriptions  Medication Sig Dispense Refill  . BREO ELLIPTA 100-25 MCG/INH AEPB INHALE 1 PUFF DAILY 60 each 3  . buPROPion (WELLBUTRIN SR) 100 MG 12 hr tablet Take 150 tablets by mouth 2 (two) times daily.     . Cholecalciferol (HM VITAMIN D3) 4000 UNITS CAPS Take by mouth daily as needed.     . citalopram (CELEXA) 40 MG tablet Take 40 mg by mouth daily.    . COMBIVENT RESPIMAT 20-100 MCG/ACT AERS respimat Inhale 2 puffs into the lungs daily.     . fluticasone (FLONASE) 50 MCG/ACT nasal spray Place 1 spray into both nostrils daily.    . furosemide (LASIX) 20 MG tablet Take 1 tablet (20 mg total) by mouth daily. 90 tablet 3  . guaiFENesin (MUCINEX) 600 MG 12 hr tablet Take 600 mg by mouth 2 (two) times daily.    . indomethacin (INDOCIN SR) 75 MG CR capsule Take 75 mg by mouth 2 (two) times daily.    . mometasone (NASONEX) 50 MCG/ACT nasal spray Place 2 sprays into the nose daily. 17 g 11  . omeprazole (PRILOSEC) 20 MG capsule Take 1 capsule by mouth daily.    Marland Kitchen. OVER THE COUNTER MEDICATION Tumeric twice daily    .  PREMPRO 0.3-1.5 MG per tablet Take 1 tablet by mouth daily.    . traMADol (ULTRAM) 50 MG tablet Take 2 tablets by mouth 2 (two) times daily as needed.      No current facility-administered medications for this visit.    Past Medical History  Diagnosis Date  . Mood disorder   . SOB (shortness of breath)   . GERD (gastroesophageal reflux disease)   . CHF (congestive heart failure)   . Arthritis     Past Surgical History  Procedure Laterality Date  . Rotator cuff repair      right  . Back surgery      lumbar  . Tonsillectomy and adenoidectomy    . Tubal ligation      ROS:  Positive for vertigo, neck pain. Otherwise as stated in the HPI and negative for all other systems.  PHYSICAL EXAM BP 128/80 mmHg  Pulse 87 GENERAL:  Well appearing NECK:  No jugular venous distention, waveform within normal limits, carotid upstroke brisk and symmetric, no bruits, no thyromegaly LUNGS:  Clear to auscultation bilaterally HEART:  PMI not displaced or sustained,S1 and S2 within normal limits, no S3, no S4, no clicks, no rubs, no murmurs ABD:  Flat, positive bowel sounds normal in frequency in pitch, no bruits, no rebound, no guarding, no midline  pulsatile mass, no hepatomegaly, no splenomegaly EXT:  2 plus pulses throughout, no edema, no cyanosis no clubbing  EKG:  Sinus rhythm, rate 87, axis within normal limits, intervals within normal limits, no ST-T wave changes.  02/07/2015  ASSESSMENT AND PLAN  DYSPNEA:  This could still be an anginal equivalent. Given this the patient will need stress testing. With her shortness of breath she would not be a walk on a treadmill. Therefore, she will have a [employer].  CHRONIC DIASTOLIC HF:  I do not suspect this is contributing. She seems to be euvolemic. She will continue on meds as listed.

## 2015-02-07 NOTE — Patient Instructions (Signed)
Your physician has requested that you have a lexiscan myoview. For further information please visit [email or web address]. Please follow instruction sheet, as given.  Your physician wants you to follow-up in: 18 months with Dr. Antoine PocheHochrein. You will receive a reminder letter in the mail two months in advance. If you don't receive a letter, please call our office to schedule the follow-up appointment.

## 2015-02-18 ENCOUNTER — Telehealth (HOSPITAL_COMMUNITY): Payer: Self-pay

## 2015-02-18 NOTE — Telephone Encounter (Signed)
Encounter complete. 

## 2015-02-22 ENCOUNTER — Telehealth (HOSPITAL_COMMUNITY): Payer: Self-pay

## 2015-02-22 NOTE — Telephone Encounter (Signed)
Encounter complete. 

## 2015-02-23 ENCOUNTER — Inpatient Hospital Stay (HOSPITAL_COMMUNITY): Admission: RE | Admit: 2015-02-23 | Payer: 59 | Source: Ambulatory Visit

## 2015-02-25 ENCOUNTER — Ambulatory Visit (HOSPITAL_COMMUNITY)
Admission: RE | Admit: 2015-02-25 | Discharge: 2015-02-25 | Disposition: A | Discharge status: Home / Self Care | Payer: 59 | Source: Ambulatory Visit | Attending: Cardiology | Admitting: Cardiology

## 2015-02-25 DIAGNOSIS — I2510 Atherosclerotic heart disease of native coronary artery without angina pectoris: Secondary | ICD-10-CM

## 2015-02-25 DIAGNOSIS — Z87891 Personal history of nicotine dependence: Secondary | ICD-10-CM | POA: Insufficient documentation

## 2015-02-25 DIAGNOSIS — R5383 Other fatigue: Secondary | ICD-10-CM | POA: Insufficient documentation

## 2015-02-25 DIAGNOSIS — Z8249 Family history of ischemic heart disease and other diseases of the circulatory system: Secondary | ICD-10-CM | POA: Diagnosis not present

## 2015-02-25 DIAGNOSIS — J439 Emphysema, unspecified: Secondary | ICD-10-CM | POA: Diagnosis not present

## 2015-02-25 DIAGNOSIS — I5030 Unspecified diastolic (congestive) heart failure: Secondary | ICD-10-CM | POA: Insufficient documentation

## 2015-02-25 DIAGNOSIS — I2584 Coronary atherosclerosis due to calcified coronary lesion: Secondary | ICD-10-CM | POA: Diagnosis not present

## 2015-02-25 DIAGNOSIS — N182 Chronic kidney disease, stage 2 (mild): Secondary | ICD-10-CM | POA: Insufficient documentation

## 2015-02-25 DIAGNOSIS — R0600 Dyspnea, unspecified: Secondary | ICD-10-CM | POA: Diagnosis present

## 2015-02-25 LAB — MYOCARDIAL PERFUSION IMAGING
CHL CUP NUCLEAR SDS: 0
LV dias vol: 62 mL
LV sys vol: 30 mL
NUC STRESS TID: 1.31
Peak HR: 101 {beats}/min
Rest HR: 77 {beats}/min
SRS: 0
SSS: 0

## 2015-02-25 MED ORDER — TECHNETIUM TC 99M SESTAMIBI GENERIC - CARDIOLITE
10.1000 | Freq: Once | INTRAVENOUS | Status: AC | PRN
  Administered 2015-02-25: 10.1 via INTRAVENOUS

## 2015-02-25 MED ORDER — REGADENOSON 0.4 MG/5ML IV SOLN
0.4000 mg | Freq: Once | INTRAVENOUS | Status: AC
  Administered 2015-02-25: 0.4 mg via INTRAVENOUS

## 2015-02-25 MED ORDER — TECHNETIUM TC 99M SESTAMIBI GENERIC - CARDIOLITE
30.7000 | Freq: Once | INTRAVENOUS | Status: AC | PRN
  Administered 2015-02-25: 30.7 via INTRAVENOUS

## 2015-05-31 ENCOUNTER — Other Ambulatory Visit (HOSPITAL_COMMUNITY): Payer: Self-pay | Admitting: Neurosurgery

## 2015-06-14 ENCOUNTER — Other Ambulatory Visit: Payer: Self-pay | Admitting: Adult Health

## 2015-06-14 DIAGNOSIS — J432 Centrilobular emphysema: Secondary | ICD-10-CM

## 2015-06-15 ENCOUNTER — Ambulatory Visit (INDEPENDENT_AMBULATORY_CARE_PROVIDER_SITE_OTHER)
Admission: RE | Admit: 2015-06-15 | Discharge: 2015-06-15 | Disposition: A | Discharge status: Home / Self Care | Payer: 59 | Source: Ambulatory Visit | Attending: Adult Health | Admitting: Adult Health

## 2015-06-15 ENCOUNTER — Ambulatory Visit (INDEPENDENT_AMBULATORY_CARE_PROVIDER_SITE_OTHER): Payer: 59 | Admitting: Adult Health

## 2015-06-15 ENCOUNTER — Encounter: Payer: Self-pay | Admitting: Adult Health

## 2015-06-15 VITALS — BP 108/74 | HR 84 | Temp 97.8°F | Ht 62.0 in | Wt 163.0 lb

## 2015-06-15 DIAGNOSIS — J432 Centrilobular emphysema: Secondary | ICD-10-CM | POA: Diagnosis not present

## 2015-06-15 DIAGNOSIS — Z01818 Encounter for other preprocedural examination: Secondary | ICD-10-CM | POA: Diagnosis not present

## 2015-06-15 DIAGNOSIS — J439 Emphysema, unspecified: Secondary | ICD-10-CM | POA: Diagnosis not present

## 2015-06-15 NOTE — Progress Notes (Signed)
Chart and office note reviewed in detail along with available xrays/ labs > agree with a/p as outlined - she is acceptable risk for neck surgery

## 2015-06-15 NOTE — Progress Notes (Signed)
Subjective:    Patient ID: [name], female    DOB: [birth year], 58 y.o.   MRN: [id number]  HPI 59 yo WF with GOLD II COPD , previous patient of Dr. Delford Field  .   TEST 2015 PFT >Post BD FEV1 70%, ratio 59 , FVC 93% , DLCO 57% 06/15/2015 CXR with COPD changes    06/15/2015 Follow up : COPD and surgical clearance  Pt presents for follow up .  Says overall her breathing is at baseline with no flare in cough or dyspnea.  Does get winded with walking on incline or stairs. She is independent and drives.  She remains on BREO daily  and Combivent Twice daily  .  She denies chest pain, orthopnea , edema or fever.  Flu shot is utd   She is have neck surgery next week. She is undergoing cervical corpectomy/anterior cervical fusion by Dr. Wynetta Emery . She will require general anesthesia.  We discussed her surgical risk with her underlying pulmonary condition as below. Encouraged her to take her inhalers the day of surgery. Early mobilization is key, as directed by her surgeon.  Encouraged her to use flutter valve and incentive  Spirometry. She falls into a low to moderate risk group .   Arozullah Postperative Pulmonary Risk Score Comment Score  Type of surgery - abd ao aneurysm (27), thoracic (21), neurosurgery / upper abdominal / vascular (21), neck (11) Neck  11   Emergency Surgery - (11) Elective  0  ALbumin < 3 or poor nutritional state - (9) nml  0   BUN > 30 -  (8) nml 2015  0  Partial or completely dependent functional status - (7) Independent  0  COPD -  (6)  6  Age - 60 to 29 (4), > 70  (6) 59  0  TOTAL    Risk Stratifcation scores  - < 10, 11-19, 20-27, 28-40, >40 Low moderate 17   Major Pulmonary risks identified in the multifactorial risk analysis are but not limited to a) pneumonia; b) recurrent intubation risk; c) prolonged or recurrent acute respiratory failure needing mechanical ventilation; d) prolonged hospitalization; e) DVT/Pulmonary embolism; f) Acute Pulmonary  edema  Recommend 1. Short duration of surgery as much as possible and avoid paralytic if possible 2. Recovery in step down or ICU with Pulmonary consultation if indicated  3. DVT prophylaxis 4. Aggressive pulmonary toilet with o2, bronchodilatation flutter valve , and incentive spirometry and early ambulation 5. Would take BREO and Combivent day of surgery surgery.    Review of Systems Constitutional:   No  weight loss, night sweats,  Fevers, chills, fatigue, or  lassitude.  HEENT:   No headaches,  Difficulty swallowing,  Tooth/dental problems, or  Sore throat,                No sneezing, itching, ear ache, nasal congestion, post nasal drip,   CV:  No chest pain,  Orthopnea, PND, swelling in lower extremities, anasarca, dizziness, palpitations, syncope.   GI  No heartburn, indigestion, abdominal pain, nausea, vomiting, diarrhea, change in bowel habits, loss of appetite, bloody stools.   Resp:  .  No excess mucus, no productive cough,  No non-productive cough,  No coughing up of blood.  No change in color of mucus.  No wheezing.  No chest wall deformity  Skin: no rash or lesions.  GU: no dysuria, change in color of urine, no urgency or frequency.  No flank pain, no hematuria   MS:  No joint pain or swelling.  No decreased range of motion.  +neck and back  pain.  Psych:  No change in mood or affect. No depression or anxiety.  No memory loss.         Objective:   Physical Exam  GEN: A/Ox3; pleasant , NAD, well nourished  VS reviewed   HEENT:  [place]/AT,  EACs-clear, TMs-wnl, NOSE-clear, THROAT-clear, no lesions, no postnasal drip or exudate noted.   NECK:  Supple w/ fair ROM; no JVD; normal carotid impulses w/o bruits; no thyromegaly or nodules palpated; no lymphadenopathy.  RESP  Clear  P & A; w/o, wheezes/ rales/ or rhonchi.no accessory muscle use, no dullness to percussion  CARD:  RRR, no m/r/g  , no peripheral edema, pulses intact, no cyanosis or clubbing.  GI:   Soft &  nt; nml bowel sounds; no organomegaly or masses detected.  Musco: Warm bil, no deformities or joint swelling noted.   Neuro: alert, no focal deficits noted.    Skin: Warm, no lesions or rashes          Assessment & Plan:

## 2015-06-15 NOTE — Assessment & Plan Note (Signed)
Compensated on present regimen  Plan   Cont on current regimen   

## 2015-06-15 NOTE — Assessment & Plan Note (Signed)
Pt has underlying moderate COPD that is well controlled.  She is a low to moderate risk (Arozullah risk score)  We discussed her risk and advised on the following to pt and surgeon.   Recommend 1. Short duration of surgery as much as possible and avoid paralytic if possible 2. Recovery in step down or ICU with Pulmonary consultation if indicated  3. DVT prophylaxis 4. Aggressive pulmonary toilet with o2, bronchodilatation flutter valve , and incentive spirometry and early ambulation 5. Would take BREO and Combivent day of surgery surgery.

## 2015-06-15 NOTE — Patient Instructions (Signed)
You are at a low to moderate risk for neck surgery -cleared to proceed.  Use your flutter valve and incentive spirometry after surgery  Take your BREO and Combivent prior to surgery and continue after surgery .  Getting mobile after surgery is key as your surgeon directs you.  Follow up Dr. Sherene SiresWert  In 3 months and As needed  .

## 2015-06-17 NOTE — Progress Notes (Addendum)
Anesthesia Chart Review: Patient is a 59 year old female schedule for C6-7 ACDF on 06/22/15 by Dr. Wynetta Emeryram. Currently, she is also on the schedule for C4-5 cervical corpectomy on 07/08/15.   History includes former smoker, moderate COPD (Gold stage II), GERD, chronic diastolic CHF, mood disorder, arthritis, back surgery, T&A. Mild OSA by 2015 sleep study. PCP is She has periodically been evaluated by cardiology for dyspnea, last visit with Dr. Antoine PocheHochrein in 01/2015 with low risk stress test. No further cardiology work-up recommended.  Pulmonologist Dr. Sandrea HughsMichael Wert felt she was "acceptable risk for neck surgery." (Previously seen by Dr. Shan LevansPatrick Wright.) Additional pre-operative pulmonology recommendations outlined by Rubye Oaksammy Parrett, NP on 06/15/15: 1. Short duration of surgery as much as possible and avoid paralytic if possible 2. Recovery in step down or ICU with Pulmonary consultation if indicated  3. DVT prophylaxis 4. Aggressive pulmonary toilet with o2, bronchodilatation flutter valve , and incentive spirometry and early ambulation 5. Would take BREO and Combivent day of surgery surgery.   02/07/15 EKG: NSR.  02/25/15 Nuclear Stress Test:  The left ventricular ejection fraction is normal (50-55%). Nuclear stress EF: 51-55%.  There was no ST segment deviation noted during stress.  The study is normal.  This is a low risk study.  07/31/13 Echo: Study Conclusions Left ventricle: The cavity size was normal. Systolic function was normal. Wall motion was normal; there were no regional wall motion abnormalities. Doppler parameters are consistent with abnormal left ventricular relaxation (grade 1 diastolic dysfunction). Impressions: - Abnormal relaxation without evidence for elevated filling pressures, otherwise normal study.  06/15/15 CXR: IMPRESSION: 1. Hyper aeration may indicate emphysema. Also question bronchitis. No definite active process. 2. Bibasilar linear atelectasis or  scarring.  09/12/13 Sleep Study: IMPRESSION/ RECOMMENDATION:  1) Mild obstructive sleep apnea/hypopnea syndrome, AHI 12 per hour with events in all sleep positions. Moderate snoring with oxygen desaturation to a nadir of 89% and mean oxygen saturation through the study of 91.8% on room air.  2) sleep onset was delayed until after midnight with no bedtime medication taken. This prevented application of split protocol CPAP titration. This patient can return for dedicated CPAP titration study if appropriate. 3) Video review shows the patient wearing a sling on her right arm. She slept mostly supine and on her left side. Implication for sleep comfort and relation to delay in sleep onset are unknown.  08/26/13 PFTs: FVC 2.76 (86%), FEV1 1.58 (63%)-->Post BD FVC 93%, FEV1 70%, ratio 59, DLCO 57%. Moderate obstruction. DLCO moderately reduced.  She is for labs at PAT. (Updated: CBC and BMET from 06/20/15 noted. Anticipate she can proceed if no new changes.)  Jo Ochsllison Elonna Mcfarlane, PA-C [facility]esthesiology Phone [phone] 06/17/2015 5:36 PM

## 2015-06-20 ENCOUNTER — Ambulatory Visit: Payer: 59 | Admitting: Pulmonary Disease

## 2015-06-20 ENCOUNTER — Encounter (HOSPITAL_COMMUNITY)
Admission: RE | Admit: 2015-06-20 | Discharge: 2015-06-20 | Disposition: A | Discharge status: Home / Self Care | Payer: 59 | Source: Ambulatory Visit | Attending: Neurosurgery | Admitting: Neurosurgery

## 2015-06-20 ENCOUNTER — Encounter (HOSPITAL_COMMUNITY): Payer: Self-pay

## 2015-06-20 HISTORY — DX: Personal history of other diseases of the respiratory system: Z87.09

## 2015-06-20 HISTORY — DX: Personal history of other medical treatment: Z92.89

## 2015-06-20 HISTORY — DX: Dizziness and giddiness: R42

## 2015-06-20 HISTORY — DX: Depression, unspecified: F32.A

## 2015-06-20 HISTORY — DX: Pneumonia, unspecified organism: J18.9

## 2015-06-20 HISTORY — DX: Allergy status to unspecified drugs, medicaments and biological substances: Z88.9

## 2015-06-20 HISTORY — DX: Emphysema, unspecified: J43.9

## 2015-06-20 HISTORY — DX: Unspecified asthma, uncomplicated: J45.909

## 2015-06-20 HISTORY — DX: Effusion, unspecified joint: M25.40

## 2015-06-20 HISTORY — DX: Chronic obstructive pulmonary disease, unspecified: J44.9

## 2015-06-20 HISTORY — DX: Weakness: R53.1

## 2015-06-20 HISTORY — DX: Major depressive disorder, single episode, unspecified: F32.9

## 2015-06-20 HISTORY — DX: Unspecified glaucoma: H40.9

## 2015-06-20 LAB — CBC
HCT: 42.7 % (ref 36.0–46.0)
Hemoglobin: 14.4 g/dL (ref 12.0–15.0)
MCH: 31.6 pg (ref 26.0–34.0)
MCHC: 33.7 g/dL (ref 30.0–36.0)
MCV: 93.6 fL (ref 78.0–100.0)
PLATELETS: 219 10*3/uL (ref 150–400)
RBC: 4.56 MIL/uL (ref 3.87–5.11)
RDW: 13.8 % (ref 11.5–15.5)
WBC: 7.8 10*3/uL (ref 4.0–10.5)

## 2015-06-20 LAB — BASIC METABOLIC PANEL
Anion gap: 8 (ref 5–15)
BUN: 11 mg/dL (ref 6–20)
CO2: 24 mmol/L (ref 22–32)
CREATININE: 1.02 mg/dL — AB (ref 0.44–1.00)
Calcium: 9 mg/dL (ref 8.9–10.3)
Chloride: 106 mmol/L (ref 101–111)
GFR calc Af Amer: >60 mL/min (ref >60)
GFR, EST NON AFRICAN AMERICAN: 60 mL/min — AB (ref >60)
Glucose, Bld: 83 mg/dL (ref 65–99)
POTASSIUM: 4.1 mmol/L (ref 3.5–5.1)
SODIUM: 138 mmol/L (ref 135–145)

## 2015-06-20 LAB — SURGICAL PCR SCREEN
MRSA, PCR: NEGATIVE
STAPHYLOCOCCUS AUREUS: NEGATIVE

## 2015-06-20 NOTE — Progress Notes (Addendum)
Medical Md Dr.Kirsten Glahn  Cardiologist is Dr.Hochrein with last visit in epic from 02-07-15  Echo report in epic from 2015  stress test done in 2016  heart cath denies ever having one  Pulmonologist is Dr.Wert   CXR report in epic from 06-15-15  EKG in epic from 02-07-15  Sleep study in epic from 2015

## 2015-06-20 NOTE — Pre-Procedure Instructions (Signed)
[name]  06/20/2015      [facility] [id number] [name], [name] - [address] AT [facility] [address] [place] [address] Phone: [phone] Fax: [phone]  [id number] - [place], [place] - [address] [facility] [place] [address] Phone: [phone] Fax: [phone]  [facility] - [place], [place] - [address] [place] [address] Phone: [phone] Fax: [phone]    Your procedure is scheduled on Wed, Nov 23 @ 2:00 PM  Report to [facility] Admitting at 12:00 PM  Call this number if you have problems the morning of surgery:  [phone]   Remember:  Do not eat food or drink liquids after midnight.  Take these medicines the morning of surgery with A SIP OF WATER Wellbutrin(Bupropion),Celexa(Citalopram),Combivent<Bring Your Inhaler With You>,Flonase(Fluticasone),Omeprazole(Prilosec),and Tramadol(Ultram-if needed)              Stop taking your Indomethacin. No Goody's,BC's,Aleve,Aspirin,Ibuprofen,Motrin,Advil,Fish Oil,or any Herbal Medications.    Do not wear jewelry.  Do not wear lotions, powders, or colognes.  You may wear deodorant.             Men may shave face and neck.  Do not bring valuables to the hospital.  [facility] is not responsible for any belongings or valuables.  Contacts, dentures or bridgework may not be worn into surgery.  Leave your suitcase in the car.  After surgery it may be brought to your room.  For patients admitted to the hospital, discharge time will be determined by your treatment team.  Patients discharged the day of surgery will not be allowed to drive home.    Special instructions:  [place] - Preparing for Surgery  Before surgery, you can play an important role.  Because skin is not sterile, your skin needs to be as free of germs as possible.  You can reduce the number of germs on you skin by washing with CHG  (chlorahexidine gluconate) soap before surgery.  CHG is an antiseptic cleaner which kills germs and bonds with the skin to continue killing germs even after washing.  Please DO NOT use if you have an allergy to CHG or antibacterial soaps.  If your skin becomes reddened/irritated stop using the CHG and inform your nurse when you arrive at Short Stay.  Do not shave (including legs and underarms) for at least 48 hours prior to the first CHG shower.  You may shave your face.  Please follow these instructions carefully:   1.  Shower with CHG Soap the night before surgery and the                                morning of Surgery.  2.  If you choose to wash your hair, wash your hair first as usual with your       normal shampoo.  3.  After you shampoo, rinse your hair and body thoroughly to remove the                      Shampoo.  4.  Use CHG as you would any other liquid soap.  You can apply chg directly       to the skin and wash gently with scrungie or a clean washcloth.  5.  Apply the CHG Soap to your body ONLY  FROM THE NECK DOWN.        Do not use on open wounds or open sores.  Avoid contact with your eyes,       ears, mouth and genitals (private parts).  Wash genitals (private parts)       with your normal soap.  6.  Wash thoroughly, paying special attention to the area where your surgery        will be performed.  7.  Thoroughly rinse your body with warm water from the neck down.  8.  DO NOT shower/wash with your normal soap after using and rinsing off       the CHG Soap.  9.  Pat yourself dry with a clean towel.            10.  Wear clean pajamas.            11.  Place clean sheets on your bed the night of your first shower and do not        sleep with pets.  Day of Surgery  Do not apply any lotions/deoderants the morning of surgery.  Please wear clean clothes to the hospital/surgery center.    Please read over the following fact sheets that you were given. Pain Booklet, Coughing and  Deep Breathing, MRSA Information and Surgical Site Infection Prevention

## 2015-06-21 MED ORDER — CEFAZOLIN SODIUM-DEXTROSE 2-3 GM-% IV SOLR
2.0000 g | INTRAVENOUS | Status: AC
  Administered 2015-06-22 (×2): 2 g via INTRAVENOUS
  Filled 2015-06-21: qty 50

## 2015-06-21 MED ORDER — DEXAMETHASONE SODIUM PHOSPHATE 10 MG/ML IJ SOLN
10.0000 mg | INTRAMUSCULAR | Status: AC
  Administered 2015-06-22: 10 mg via INTRAVENOUS
  Filled 2015-06-21: qty 1

## 2015-06-22 ENCOUNTER — Encounter (HOSPITAL_COMMUNITY): Payer: Self-pay | Admitting: Certified Registered"

## 2015-06-22 ENCOUNTER — Inpatient Hospital Stay (HOSPITAL_COMMUNITY): Payer: 59 | Admitting: Vascular Surgery

## 2015-06-22 ENCOUNTER — Encounter (HOSPITAL_COMMUNITY)
Admission: RE | Disposition: A | Discharge status: Home / Self Care | Payer: Self-pay | Source: Ambulatory Visit | Attending: Neurosurgery

## 2015-06-22 ENCOUNTER — Inpatient Hospital Stay (HOSPITAL_COMMUNITY): Payer: 59

## 2015-06-22 ENCOUNTER — Inpatient Hospital Stay (HOSPITAL_COMMUNITY): Payer: 59 | Admitting: Anesthesiology

## 2015-06-22 ENCOUNTER — Inpatient Hospital Stay (HOSPITAL_COMMUNITY)
Admission: RE | Admit: 2015-06-22 | Discharge: 2015-06-25 | DRG: 472 | Disposition: A | Discharge status: Home / Self Care | Payer: 59 | Source: Ambulatory Visit | Attending: Neurosurgery | Admitting: Neurosurgery

## 2015-06-22 DIAGNOSIS — Z825 Family history of asthma and other chronic lower respiratory diseases: Secondary | ICD-10-CM | POA: Diagnosis not present

## 2015-06-22 DIAGNOSIS — J449 Chronic obstructive pulmonary disease, unspecified: Secondary | ICD-10-CM | POA: Diagnosis present

## 2015-06-22 DIAGNOSIS — J45909 Unspecified asthma, uncomplicated: Secondary | ICD-10-CM | POA: Diagnosis present

## 2015-06-22 DIAGNOSIS — M542 Cervicalgia: Secondary | ICD-10-CM | POA: Diagnosis present

## 2015-06-22 DIAGNOSIS — Z87891 Personal history of nicotine dependence: Secondary | ICD-10-CM | POA: Diagnosis not present

## 2015-06-22 DIAGNOSIS — G4733 Obstructive sleep apnea (adult) (pediatric): Secondary | ICD-10-CM | POA: Diagnosis present

## 2015-06-22 DIAGNOSIS — M4802 Spinal stenosis, cervical region: Secondary | ICD-10-CM | POA: Diagnosis present

## 2015-06-22 DIAGNOSIS — R42 Dizziness and giddiness: Secondary | ICD-10-CM | POA: Diagnosis present

## 2015-06-22 DIAGNOSIS — I5032 Chronic diastolic (congestive) heart failure: Secondary | ICD-10-CM | POA: Diagnosis present

## 2015-06-22 DIAGNOSIS — F329 Major depressive disorder, single episode, unspecified: Secondary | ICD-10-CM | POA: Diagnosis present

## 2015-06-22 DIAGNOSIS — K219 Gastro-esophageal reflux disease without esophagitis: Secondary | ICD-10-CM | POA: Diagnosis present

## 2015-06-22 DIAGNOSIS — Z419 Encounter for procedure for purposes other than remedying health state, unspecified: Secondary | ICD-10-CM

## 2015-06-22 DIAGNOSIS — Z8249 Family history of ischemic heart disease and other diseases of the circulatory system: Secondary | ICD-10-CM | POA: Diagnosis not present

## 2015-06-22 DIAGNOSIS — R0602 Shortness of breath: Secondary | ICD-10-CM

## 2015-06-22 DIAGNOSIS — M4712 Other spondylosis with myelopathy, cervical region: Secondary | ICD-10-CM | POA: Diagnosis present

## 2015-06-22 DIAGNOSIS — Z79899 Other long term (current) drug therapy: Secondary | ICD-10-CM | POA: Diagnosis not present

## 2015-06-22 HISTORY — PX: ANTERIOR CERVICAL CORPECTOMY: SHX1159

## 2015-06-22 SURGERY — ANTERIOR CERVICAL CORPECTOMY
Anesthesia: General

## 2015-06-22 MED ORDER — PHENYLEPHRINE HCL 10 MG/ML IJ SOLN
10.0000 mg | INTRAVENOUS | Status: DC | PRN
  Administered 2015-06-22: 20 ug/min via INTRAVENOUS

## 2015-06-22 MED ORDER — PROPOFOL 10 MG/ML IV BOLUS
INTRAVENOUS | Status: DC | PRN
  Administered 2015-06-22: 150 mg via INTRAVENOUS

## 2015-06-22 MED ORDER — OXYCODONE-ACETAMINOPHEN 5-325 MG PO TABS
1.0000 | ORAL_TABLET | ORAL | Status: DC | PRN
  Administered 2015-06-22 – 2015-06-25 (×10): 2 via ORAL
  Filled 2015-06-22 (×9): qty 2

## 2015-06-22 MED ORDER — CONJ ESTROG-MEDROXYPROGEST ACE 0.3-1.5 MG PO TABS
1.0000 | ORAL_TABLET | Freq: Every day | ORAL | Status: DC
Start: 2015-06-22 — End: 2015-06-24

## 2015-06-22 MED ORDER — ONDANSETRON HCL 4 MG/2ML IJ SOLN
INTRAMUSCULAR | Status: DC | PRN
  Administered 2015-06-22 (×2): 4 mg via INTRAVENOUS

## 2015-06-22 MED ORDER — SODIUM CHLORIDE 0.9 % IV SOLN: 250.0000 mL | INTRAVENOUS | Status: DC

## 2015-06-22 MED ORDER — METHOCARBAMOL 500 MG PO TABS
500.0000 mg | ORAL_TABLET | Freq: Four times a day (QID) | ORAL | Status: DC | PRN
  Administered 2015-06-22 – 2015-06-25 (×6): 500 mg via ORAL
  Filled 2015-06-22 (×5): qty 1

## 2015-06-22 MED ORDER — NEOSTIGMINE METHYLSULFATE 10 MG/10ML IV SOLN
INTRAVENOUS | Status: DC | PRN
  Administered 2015-06-22: 3 mg via INTRAVENOUS

## 2015-06-22 MED ORDER — THROMBIN 5000 UNITS EX SOLR: CUTANEOUS | Status: DC | PRN

## 2015-06-22 MED ORDER — ROCURONIUM BROMIDE 50 MG/5ML IV SOLN
INTRAVENOUS | Status: AC
Start: 2015-06-22 — End: 2015-06-22
  Filled 2015-06-22: qty 1

## 2015-06-22 MED ORDER — DEXAMETHASONE SODIUM PHOSPHATE 4 MG/ML IJ SOLN
4.0000 mg | Freq: Four times a day (QID) | INTRAMUSCULAR | Status: DC
  Administered 2015-06-24 – 2015-06-25 (×3): 4 mg via INTRAVENOUS
  Filled 2015-06-22 (×3): qty 1

## 2015-06-22 MED ORDER — GUAIFENESIN ER 600 MG PO TB12
600.0000 mg | ORAL_TABLET | Freq: Two times a day (BID) | ORAL | Status: DC
Start: 2015-06-22 — End: 2015-06-25
  Administered 2015-06-22 – 2015-06-25 (×6): 600 mg via ORAL
  Filled 2015-06-22 (×6): qty 1

## 2015-06-22 MED ORDER — INDOMETHACIN ER 75 MG PO CPCR
75.0000 mg | ORAL_CAPSULE | Freq: Two times a day (BID) | ORAL | Status: DC
  Administered 2015-06-22 – 2015-06-25 (×6): 75 mg via ORAL
  Filled 2015-06-22 (×9): qty 1

## 2015-06-22 MED ORDER — THROMBIN 20000 UNITS EX SOLR
OROMUCOSAL | Status: DC | PRN
  Administered 2015-06-22: 15:00:00 via TOPICAL

## 2015-06-22 MED ORDER — HYDROMORPHONE HCL 1 MG/ML IJ SOLN
0.2500 mg | INTRAMUSCULAR | Status: DC | PRN
  Administered 2015-06-22 (×2): 0.5 mg via INTRAVENOUS

## 2015-06-22 MED ORDER — DOCUSATE SODIUM 100 MG PO CAPS
100.0000 mg | ORAL_CAPSULE | Freq: Two times a day (BID) | ORAL | Status: DC
  Administered 2015-06-22 – 2015-06-25 (×6): 100 mg via ORAL
  Filled 2015-06-22 (×6): qty 1

## 2015-06-22 MED ORDER — MENTHOL 3 MG MT LOZG: 1.0000 | LOZENGE | OROMUCOSAL | Status: DC | PRN

## 2015-06-22 MED ORDER — FLUTICASONE PROPIONATE 50 MCG/ACT NA SUSP
1.0000 | Freq: Every day | NASAL | Status: DC
  Administered 2015-06-22 – 2015-06-24 (×3): 1 via NASAL
  Filled 2015-06-22: qty 16

## 2015-06-22 MED ORDER — HYDROMORPHONE HCL 1 MG/ML IJ SOLN
INTRAMUSCULAR | Status: AC
  Filled 2015-06-22: qty 1

## 2015-06-22 MED ORDER — GLYCOPYRROLATE 0.2 MG/ML IJ SOLN
INTRAMUSCULAR | Status: DC | PRN
  Administered 2015-06-22: 0.4 mg via INTRAVENOUS

## 2015-06-22 MED ORDER — IPRATROPIUM-ALBUTEROL 0.5-2.5 (3) MG/3ML IN SOLN
3.0000 mL | Freq: Every day | RESPIRATORY_TRACT | Status: DC
  Administered 2015-06-23 – 2015-06-25 (×3): 3 mL via RESPIRATORY_TRACT
  Filled 2015-06-22 (×3): qty 3

## 2015-06-22 MED ORDER — CITALOPRAM HYDROBROMIDE 40 MG PO TABS
40.0000 mg | ORAL_TABLET | Freq: Every day | ORAL | Status: DC
  Administered 2015-06-22 – 2015-06-25 (×4): 40 mg via ORAL
  Filled 2015-06-22 (×4): qty 1

## 2015-06-22 MED ORDER — ALBUTEROL SULFATE HFA 108 (90 BASE) MCG/ACT IN AERS
INHALATION_SPRAY | RESPIRATORY_TRACT | Status: DC | PRN
  Administered 2015-06-22: 4 via RESPIRATORY_TRACT

## 2015-06-22 MED ORDER — PHENOL 1.4 % MT LIQD: 1.0000 | OROMUCOSAL | Status: DC | PRN

## 2015-06-22 MED ORDER — ACETAMINOPHEN 325 MG PO TABS: 650.0000 mg | ORAL_TABLET | ORAL | Status: DC | PRN

## 2015-06-22 MED ORDER — LIDOCAINE HCL (CARDIAC) 20 MG/ML IV SOLN
INTRAVENOUS | Status: AC
  Filled 2015-06-22: qty 5

## 2015-06-22 MED ORDER — ROCURONIUM BROMIDE 100 MG/10ML IV SOLN
INTRAVENOUS | Status: DC | PRN
  Administered 2015-06-22: 50 mg via INTRAVENOUS

## 2015-06-22 MED ORDER — SODIUM CHLORIDE 0.9 % IR SOLN
Status: DC | PRN
  Administered 2015-06-22: 15:00:00

## 2015-06-22 MED ORDER — METHOCARBAMOL 500 MG PO TABS
ORAL_TABLET | ORAL | Status: AC
  Filled 2015-06-22: qty 1

## 2015-06-22 MED ORDER — ALBUTEROL SULFATE HFA 108 (90 BASE) MCG/ACT IN AERS
INHALATION_SPRAY | RESPIRATORY_TRACT | Status: AC
  Filled 2015-06-22: qty 6.7

## 2015-06-22 MED ORDER — PHENYLEPHRINE HCL 10 MG/ML IJ SOLN
INTRAMUSCULAR | Status: DC | PRN
  Administered 2015-06-22: 40 ug via INTRAVENOUS
  Administered 2015-06-22 (×2): 80 ug via INTRAVENOUS
  Administered 2015-06-22: 40 ug via INTRAVENOUS
  Administered 2015-06-22: 120 ug via INTRAVENOUS
  Administered 2015-06-22: 40 ug via INTRAVENOUS
  Administered 2015-06-22: 120 ug via INTRAVENOUS
  Administered 2015-06-22: 80 ug via INTRAVENOUS

## 2015-06-22 MED ORDER — PROPOFOL 10 MG/ML IV BOLUS
INTRAVENOUS | Status: AC
Start: 2015-06-22 — End: 2015-06-22
  Filled 2015-06-22: qty 20

## 2015-06-22 MED ORDER — FENTANYL CITRATE (PF) 100 MCG/2ML IJ SOLN
INTRAMUSCULAR | Status: DC | PRN
  Administered 2015-06-22: 50 ug via INTRAVENOUS
  Administered 2015-06-22: 100 ug via INTRAVENOUS
  Administered 2015-06-22 (×2): 50 ug via INTRAVENOUS

## 2015-06-22 MED ORDER — MIDAZOLAM HCL 2 MG/2ML IJ SOLN
INTRAMUSCULAR | Status: AC
Start: 2015-06-22 — End: 2015-06-22
  Filled 2015-06-22: qty 2

## 2015-06-22 MED ORDER — THROMBIN 5000 UNITS EX SOLR
CUTANEOUS | Status: DC | PRN
  Administered 2015-06-22 (×2): via TOPICAL

## 2015-06-22 MED ORDER — ONDANSETRON HCL 4 MG/2ML IJ SOLN
INTRAMUSCULAR | Status: AC
Start: 2015-06-22 — End: 2015-06-22
  Filled 2015-06-22: qty 2

## 2015-06-22 MED ORDER — HYDROMORPHONE HCL 1 MG/ML IJ SOLN
0.5000 mg | INTRAMUSCULAR | Status: DC | PRN
  Administered 2015-06-22 – 2015-06-24 (×6): 1 mg via INTRAVENOUS
  Filled 2015-06-22 (×6): qty 1

## 2015-06-22 MED ORDER — ACETAMINOPHEN 650 MG RE SUPP: 650.0000 mg | RECTAL | Status: DC | PRN

## 2015-06-22 MED ORDER — FENTANYL CITRATE (PF) 250 MCG/5ML IJ SOLN
INTRAMUSCULAR | Status: AC
  Filled 2015-06-22: qty 5

## 2015-06-22 MED ORDER — PANTOPRAZOLE SODIUM 40 MG PO TBEC
40.0000 mg | DELAYED_RELEASE_TABLET | Freq: Every day | ORAL | Status: DC
  Administered 2015-06-23 – 2015-06-25 (×3): 40 mg via ORAL
  Filled 2015-06-22 (×3): qty 1

## 2015-06-22 MED ORDER — 0.9 % SODIUM CHLORIDE (POUR BTL) OPTIME
TOPICAL | Status: DC | PRN
  Administered 2015-06-22: 1000 mL

## 2015-06-22 MED ORDER — OXYCODONE-ACETAMINOPHEN 5-325 MG PO TABS
ORAL_TABLET | ORAL | Status: AC
  Filled 2015-06-22: qty 2

## 2015-06-22 MED ORDER — CYCLOBENZAPRINE HCL 10 MG PO TABS: 10.0000 mg | ORAL_TABLET | Freq: Three times a day (TID) | ORAL | Status: DC | PRN

## 2015-06-22 MED ORDER — SODIUM CHLORIDE 0.9 % IJ SOLN: 3.0000 mL | INTRAMUSCULAR | Status: DC | PRN

## 2015-06-22 MED ORDER — THROMBIN 20000 UNITS EX KIT: PACK | CUTANEOUS | Status: DC | PRN

## 2015-06-22 MED ORDER — PROMETHAZINE HCL 25 MG/ML IJ SOLN: 6.2500 mg | INTRAMUSCULAR | Status: DC | PRN

## 2015-06-22 MED ORDER — ONDANSETRON HCL 4 MG/2ML IJ SOLN: 4.0000 mg | INTRAMUSCULAR | Status: DC | PRN

## 2015-06-22 MED ORDER — DEXAMETHASONE 4 MG PO TABS
4.0000 mg | ORAL_TABLET | Freq: Four times a day (QID) | ORAL | Status: DC
  Administered 2015-06-22 – 2015-06-25 (×8): 4 mg via ORAL
  Filled 2015-06-22 (×8): qty 1

## 2015-06-22 MED ORDER — FLUTICASONE FUROATE-VILANTEROL 200-25 MCG/INH IN AEPB: 1.0000 | INHALATION_SPRAY | Freq: Every day | RESPIRATORY_TRACT | Status: DC

## 2015-06-22 MED ORDER — HYDROCODONE-ACETAMINOPHEN 7.5-325 MG PO TABS: 1.0000 | ORAL_TABLET | Freq: Once | ORAL | Status: DC | PRN

## 2015-06-22 MED ORDER — BUPROPION HCL ER (SR) 100 MG PO TB12
100.0000 mg | ORAL_TABLET | Freq: Two times a day (BID) | ORAL | Status: DC
  Administered 2015-06-22 – 2015-06-25 (×6): 100 mg via ORAL
  Filled 2015-06-22 (×8): qty 1

## 2015-06-22 MED ORDER — LIDOCAINE HCL (CARDIAC) 20 MG/ML IV SOLN
INTRAVENOUS | Status: DC | PRN
  Administered 2015-06-22: 60 mg via INTRAVENOUS

## 2015-06-22 MED ORDER — CEFAZOLIN SODIUM 1-5 GM-% IV SOLN
1.0000 g | Freq: Three times a day (TID) | INTRAVENOUS | Status: AC
  Administered 2015-06-23 (×2): 1 g via INTRAVENOUS
  Filled 2015-06-22 (×2): qty 50

## 2015-06-22 MED ORDER — LACTATED RINGERS IV SOLN
INTRAVENOUS | Status: DC
  Administered 2015-06-22 (×2): via INTRAVENOUS

## 2015-06-22 MED ORDER — SODIUM CHLORIDE 0.9 % IJ SOLN
3.0000 mL | Freq: Two times a day (BID) | INTRAMUSCULAR | Status: DC
  Administered 2015-06-23 – 2015-06-25 (×4): 3 mL via INTRAVENOUS

## 2015-06-22 MED ORDER — FUROSEMIDE 20 MG PO TABS
20.0000 mg | ORAL_TABLET | Freq: Every day | ORAL | Status: DC
  Administered 2015-06-23 – 2015-06-25 (×3): 20 mg via ORAL
  Filled 2015-06-22 (×4): qty 1

## 2015-06-22 MED ORDER — DEXAMETHASONE SODIUM PHOSPHATE 4 MG/ML IJ SOLN
INTRAMUSCULAR | Status: AC
  Filled 2015-06-22: qty 2

## 2015-06-22 MED ORDER — MIDAZOLAM HCL 5 MG/5ML IJ SOLN
INTRAMUSCULAR | Status: DC | PRN
  Administered 2015-06-22: 2 mg via INTRAVENOUS

## 2015-06-22 SURGICAL SUPPLY — 58 items
BAG DECANTER FOR FLEXI CONT (MISCELLANEOUS) ×3 IMPLANT
BENZOIN TINCTURE PRP APPL 2/3 (GAUZE/BANDAGES/DRESSINGS) ×3 IMPLANT
BRUSH SCRUB EZ PLAIN DRY (MISCELLANEOUS) ×3 IMPLANT
BUR MATCHSTICK NEURO 3.0 LAGG (BURR) ×3 IMPLANT
CAGE NIKO 27 0/0 (Cage) ×2 IMPLANT
CAGE NIKO 27MM 0/0 (Cage) ×1 IMPLANT
CANISTER SUCT 3000ML PPV (MISCELLANEOUS) ×3 IMPLANT
CLOSURE WOUND 1/2 X4 (GAUZE/BANDAGES/DRESSINGS) ×1
DECANTER SPIKE VIAL GLASS SM (MISCELLANEOUS) ×3 IMPLANT
DRAIN SNY WOU 7FLT (WOUND CARE) ×3 IMPLANT
DRAPE C-ARM 42X72 X-RAY (DRAPES) ×6 IMPLANT
DRAPE LAPAROTOMY 100X72 PEDS (DRAPES) ×3 IMPLANT
DRAPE MICROSCOPE LEICA (MISCELLANEOUS) ×3 IMPLANT
DRAPE POUCH INSTRU U-SHP 10X18 (DRAPES) ×3 IMPLANT
DRSG OPSITE POSTOP 4X6 (GAUZE/BANDAGES/DRESSINGS) ×3 IMPLANT
DURAPREP 6ML APPLICATOR 50/CS (WOUND CARE) ×3 IMPLANT
ELECT COATED BLADE 2.86 ST (ELECTRODE) ×3 IMPLANT
ELECT REM PT RETURN 9FT ADLT (ELECTROSURGICAL) ×3
ELECTRODE REM PT RTRN 9FT ADLT (ELECTROSURGICAL) ×1 IMPLANT
EVACUATOR SILICONE 100CC (DRAIN) ×3 IMPLANT
GAUZE SPONGE 4X4 12PLY STRL (GAUZE/BANDAGES/DRESSINGS) ×3 IMPLANT
GAUZE SPONGE 4X4 16PLY XRAY LF (GAUZE/BANDAGES/DRESSINGS) IMPLANT
GLOVE BIO SURGEON STRL SZ8 (GLOVE) ×3 IMPLANT
GLOVE EXAM NITRILE LRG STRL (GLOVE) IMPLANT
GLOVE EXAM NITRILE MD LF STRL (GLOVE) IMPLANT
GLOVE EXAM NITRILE XL STR (GLOVE) IMPLANT
GLOVE EXAM NITRILE XS STR PU (GLOVE) IMPLANT
GLOVE INDICATOR 8.5 STRL (GLOVE) ×3 IMPLANT
GOWN STRL REUS W/ TWL LRG LVL3 (GOWN DISPOSABLE) IMPLANT
GOWN STRL REUS W/ TWL XL LVL3 (GOWN DISPOSABLE) ×1 IMPLANT
GOWN STRL REUS W/TWL 2XL LVL3 (GOWN DISPOSABLE) ×3 IMPLANT
GOWN STRL REUS W/TWL LRG LVL3 (GOWN DISPOSABLE)
GOWN STRL REUS W/TWL XL LVL3 (GOWN DISPOSABLE) ×2
HALTER HD/CHIN CERV TRACTION D (MISCELLANEOUS) ×3 IMPLANT
HEMOSTAT POWDER KIT SURGIFOAM (HEMOSTASIS) ×3 IMPLANT
KIT BASIN OR (CUSTOM PROCEDURE TRAY) ×3 IMPLANT
KIT ROOM TURNOVER OR (KITS) ×3 IMPLANT
LIQUID BAND (GAUZE/BANDAGES/DRESSINGS) ×3 IMPLANT
NEEDLE SPNL 20GX3.5 QUINCKE YW (NEEDLE) ×3 IMPLANT
NS IRRIG 1000ML POUR BTL (IV SOLUTION) ×3 IMPLANT
PACK LAMINECTOMY NEURO (CUSTOM PROCEDURE TRAY) ×3 IMPLANT
PIN DISTRACTION 14MM (PIN) ×6 IMPLANT
PLATE ANT CERV XTEND 3 LV 51 (Plate) ×3 IMPLANT
PUTTY BONE DBX 5CC MIX (Putty) ×3 IMPLANT
RUBBERBAND STERILE (MISCELLANEOUS) ×6 IMPLANT
SCREW XTD VAR 4.2 SELF TAP 16 (Screw) ×18 IMPLANT
SPACER CERVICAL FRGE 12X14X5 (Spacer) ×1 IMPLANT
SPACER FORGE 12X14X5MM-7 (Spacer) ×2 IMPLANT
SPONGE INTESTINAL PEANUT (DISPOSABLE) ×3 IMPLANT
SPONGE SURGIFOAM ABS GEL 100 (HEMOSTASIS) ×3 IMPLANT
STRIP CLOSURE SKIN 1/2X4 (GAUZE/BANDAGES/DRESSINGS) ×2 IMPLANT
SUT VIC AB 3-0 SH 8-18 (SUTURE) ×3 IMPLANT
SUT VICRYL 4-0 PS2 18IN ABS (SUTURE) ×3 IMPLANT
TAPE CLOTH 4X10 WHT NS (GAUZE/BANDAGES/DRESSINGS) IMPLANT
TOWEL OR 17X24 6PK STRL BLUE (TOWEL DISPOSABLE) ×3 IMPLANT
TOWEL OR 17X26 10 PK STRL BLUE (TOWEL DISPOSABLE) ×3 IMPLANT
TRAP SPECIMEN MUCOUS 40CC (MISCELLANEOUS) ×3 IMPLANT
WATER STERILE IRR 1000ML POUR (IV SOLUTION) ×3 IMPLANT

## 2015-06-22 NOTE — Op Note (Signed)
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  Postoperative diagnosis: Same  Procedure: Anterior cervical corpectomies of C4 and C5 as well as anterior cervical discectomy at C6-7 with microdissection. Anterior cervical strut using the NIKO globus peek cage packed with locally harvested autograft mixed with DBX mix from C3-C6 and allograft at C6-7. Anterior cervical plating from C3-C7 using utilizing the globus extend plating system with 16 mm bicortical screws at C3 C6 and C7  Surgeon: Jillyn Hidden Nizhoni Parlow  Asst.: Sharlet Salina ditty  Anesthesia: Gen.  EBL: Minimal  History of present illness: Patient is very pleasant 59 year old female is a progress worsening neck pain headaches bilateral shoulder and arm pain numbness tingling weakness in her hands. Patient imaging showed significant kyphotic deformity with retropulsion of the vertebral bodies of C4 and C5 into her spinal cord with significant spinal cord compression from the bodies of C4 and C5 as well as the disc at C6-7. Due to patient's progression of clinical syndrome imaging findings and failure conservative treatment I recommended 2 level corpectomy of C4 and C5 and a one level discectomy at C6-7. I extensively went over the risks and benefits of the operation with the patient as well as perioperative course expectations of outcome and alternatives to surgery and she understands and agrees to proceed forward.  Operative procedure: Patient brought into the or was induced under general anesthesia positioned supine the neck in slight extension in 5 pounds of halter traction the right side of her neck was prepped and draped in routine sterile fashion preoperative x-ray localize the appropriate level so a curvilinear incision was made just off midline to the anterior border of sternomastoid the superficial layer of the platysmas dissected out and divided longitudinally the avascular  plane to sternomastoid and strap muscles was developed down to the prevertebral fascia and prevertebral fascia was dissected away with Kitners. Interoperative x-ray identified C4-5 disc space levels this was marked with a to my scalp. Then lungs close was reflected laterally and dissection was carried out from C3-C7. Self-retaining retractors placed I initially did discectomies at C4-5 and C5-6 and removed the C5 vertebral body sequentially and with a Leksell rongeur preserving the autograft for later implant. The disc space at C3-4 was very difficult to identify so I saved this for last. After I identified the posterior annulus and posterior longitudinal ligament at both the C4-5 and C5-6 disc space levels I initially began at C4-5 marching superiorly to the C4 vertebral body there was a dense amount of compression of the spinal cord at this level especially at C3-4 disc space level of the superior endplate of C4. This is all aggressively under bitten marching down the lateral gutters and then lifting up the central compressive fragment with a black nerve hook and a Kerrison rongeur. Marginal way up to the inferior aspect of the C3 vertebral body decompress the spinal cord and resumed its normal anatomic position after that. The landmarks inferiorly remove the remainder of the C5 vertebral body and the C5-6 disc is the under biting the C6 superior endplate marching laterally to compress lateral canal. Then after adequate decompression achieved from the 2 level corpectomy, my attention was taken to the discectomy at reposition the retractor performed a C6-7 discectomy this was markedly collapsed and degenerated the posterior longitudinal ligament was partially calcified remove this on piecemeal fashion exposing the thecal sac aggressive abutting both endplates decompress the central canal and then inserted a 5 mm allograft wedge  at C6-7. Then repositioned the retractor for rebuilding the corpectomy strut. I chose  the [facility] peek cage 27 mm in length 0 lordosis packed with locally harvested autograft mixed with DBX mix placed distractor pins at C3 and C6 reduced her kyphotic deformity and then inserted the cage after was packed with a mix. Postop fluoroscopy confirmed good position of the implant then I selected a 51 mm globus extend plate placed by poor cortical screws under fluoroscopy at C3 C6 and C7. All screws excellent purchase locking mechanisms were engaged and was a Cobos irrigated meticulous in space was maintained some additional autograft mixed packed laterally to the cage. Then I placed JP drain and closed the wound with interrupted Vicryl in the platysma and a running 4 subcuticular Dermabond benzo and Steri-Strip from applied patient recovered in stable condition. At the end of case all needle counts and sponge counts were correct.

## 2015-06-22 NOTE — Anesthesia Preprocedure Evaluation (Addendum)
Anesthesia Evaluation  Patient identified by MRN, date of birth, ID band Patient awake    Reviewed: Allergy & Precautions, NPO status , Patient's Chart, lab work & pertinent test results  Airway Mallampati: II  TM Distance: <3 FB Neck ROM: Limited    Dental  (+) Edentulous Upper, Edentulous Lower   Pulmonary shortness of breath, asthma , COPD, former smoker,    breath sounds clear to auscultation       Cardiovascular +CHF   Rhythm:Regular Rate:Normal  01/2015 Low risk myoview. EF 55%   Neuro/Psych Depression negative neurological ROS     GI/Hepatic Neg liver ROS, GERD  Medicated and Controlled,  Endo/Other  negative endocrine ROS  Renal/GU CRFRenal disease     Musculoskeletal  (+) Arthritis ,   Abdominal   Peds  Hematology negative hematology ROS (+)   Anesthesia Other Findings   Reproductive/Obstetrics                          Anesthesia Physical Anesthesia Plan  ASA: III  Anesthesia Plan: General   Post-op Pain Management:    Induction: Intravenous  Airway Management Planned: Oral ETT  Additional Equipment:   Intra-op Plan:   Post-operative Plan: Extubation in OR  Informed Consent: I have reviewed the patients History and Physical, chart, labs and discussed the procedure including the risks, benefits and alternatives for the proposed anesthesia with the patient or authorized representative who has indicated his/her understanding and acceptance.   Dental advisory given  Plan Discussed with: CRNA  Anesthesia Plan Comments:         Anesthesia Quick Evaluation

## 2015-06-22 NOTE — Anesthesia Postprocedure Evaluation (Signed)
Anesthesia Post Note  Patient: Jo LaughterLinda Owens  Procedure(s) Performed: Procedure(s) (LRB): ANTERIOR CERVICAL DECOMPRESSION/DISCECTOMY FUSION C6 - C7 with corpectomy (N/A)  Patient location during evaluation: PACU Anesthesia Type: General Level of consciousness: awake and alert Pain management: pain level controlled Vital Signs Assessment: post-procedure vital signs reviewed and stable Respiratory status: spontaneous breathing, nonlabored ventilation, respiratory function stable and patient connected to nasal cannula oxygen Cardiovascular status: blood pressure returned to baseline and stable Postop Assessment: No signs of nausea or vomiting Anesthetic complications: no    Last Vitals:  Filed Vitals:   06/22/15 1900 06/22/15 1915  BP: 113/73 115/68  Pulse: 111 113  Temp:    Resp: 14 15    Last Pain:  Filed Vitals:   06/22/15 1919  PainSc: 3     LLE Motor Response: Responds to commands, Purposeful movement LLE Sensation: Full sensation RLE Motor Response: Purposeful movement, Responds to commands RLE Sensation: Full sensation      Azazel Franze,W. EDMOND

## 2015-06-22 NOTE — H&P (Signed)
[name] is an 59 y.o. female.   Chief Complaint: Neck pain arm weakness HPI: Patient is a very pleasant 59 year old female is a long-standing neck pain bilateral arm pain numbness tingling and weakness. Weakness in her hands difficulty opening jars and buttoning her clothes. Workup revealed weakness predominantly of the right arm but in both hands. Workup was also consistent with a myelopathy. Imaging findings showed severe spinal cord compression from deformity at C3-4 with C4 and the C5 vertebral bodies displaced against the spinal cord. In addition she also had a disc herniation at C6-7 with spinal cord compression. Due to her imaging findings failure conservative treatment aggressive clinical syndrome I recommended anterior cervical corpectomies of C4 and C5 with a discectomy at C6-7 with reconstruction with anterior cervical plating and peek strut and allograft. Patient will also follow-up and in a delayed fashion undergo posterior cervical fusion. I've extensively gone over the risks and benefits of the operation with the patient as well as perioperative course expectations of outcome and alternatives of surgery and she understands and agrees to proceed forward.  Past Medical History  Diagnosis Date  . Mood disorder ([place])   . Arthritis   . CHF (congestive heart failure) ([facility])     takes Lasix daily  . COPD (chronic obstructive pulmonary disease) ([place])   . Emphysema lung ([place])   . SOB (shortness of breath)     with exertion  . Asthma     Breo and Combivent daily  . Multiple allergies     Fluticasone and Mucinex daily  . Pneumonia under the age of 74    hx of  . History of bronchitis 5-6wks ago  . Headache     daily   . Vertigo     takes Meclizine daily as needed  . Weakness     numbness and tingling in right arm and leg  . Joint pain   . Joint swelling   . GERD (gastroesophageal reflux disease)     takes Omeprazole daily  . History of blood transfusion 1983    no abnormal  reaction noted  . Glaucoma     mild"  . Depression     takes Wellbutrin and Citalopram daily    Past Surgical History  Procedure Laterality Date  . Rotator cuff repair      right  . Back surgery      lumbar  . Tonsillectomy and adenoidectomy    . Tubal ligation      Family History  Problem Relation Age of Onset  . CAD Father 42  . COPD Mother   . Heart disease Mother     Valve disease  . CAD Brother 33    Mild CAD  . Emphysema Paternal Grandfather   . Allergies Mother   . Asthma Mother    Social History:  reports that she has quit smoking. Her smoking use included Cigarettes. She smoked 0.00 packs per day for 0 years. She has never used smokeless tobacco. She reports that she does not drink alcohol or use illicit drugs.  Allergies: No Known Allergies  Medications Prior to Admission  Medication Sig Dispense Refill  . buPROPion (WELLBUTRIN SR) 100 MG 12 hr tablet Take 100 mg by mouth 2 (two) times daily.     . Cholecalciferol (HM VITAMIN D3) 4000 UNITS CAPS Take by mouth daily as needed.     . citalopram (CELEXA) 40 MG tablet Take 40 mg by mouth daily.    . COMBIVENT RESPIMAT 20-100  MCG/ACT AERS respimat Inhale 2 puffs into the lungs daily.     . fluticasone (FLONASE) 50 MCG/ACT nasal spray Place 1 spray into both nostrils daily.    . Fluticasone Furoate-Vilanterol (BREO ELLIPTA) 200-25 MCG/INH AEPB Inhale 1 puff into the lungs daily.    . furosemide (LASIX) 20 MG tablet Take 1 tablet (20 mg total) by mouth daily. 90 tablet 3  . guaiFENesin (MUCINEX) 600 MG 12 hr tablet Take 600 mg by mouth 2 (two) times daily.    . indomethacin (INDOCIN SR) 75 MG CR capsule Take 75 mg by mouth 2 (two) times daily.    Marland Kitchen omeprazole (PRILOSEC) 20 MG capsule Take 1 capsule by mouth daily.    Marland Kitchen OVER THE COUNTER MEDICATION Take 1 tablet by mouth 2 (two) times daily.     Marland Kitchen PREMPRO 0.3-1.5 MG per tablet Take 1 tablet by mouth daily.    . traMADol (ULTRAM) 50 MG tablet Take 2 tablets by mouth 2  (two) times daily as needed for moderate pain.       Results for orders placed or performed during the hospital encounter of 06/20/15 (from the past 48 hour(s))  Surgical pcr screen     Status: None   Collection Time: 06/20/15  3:19 PM  Result Value Ref Range   MRSA, PCR NEGATIVE NEGATIVE   Staphylococcus aureus NEGATIVE NEGATIVE    Comment:        The Xpert SA Assay (FDA approved for NASAL specimens in patients over 16 years of age), is one component of a comprehensive surveillance program.  Test performance has been validated by [facility] for patients greater than or equal to 57 year old. It is not intended to diagnose infection nor to guide or monitor treatment.   CBC     Status: None   Collection Time: 06/20/15  3:20 PM  Result Value Ref Range   WBC 7.8 4.0 - 10.5 K/uL   RBC 4.56 3.87 - 5.11 MIL/uL   Hemoglobin 14.4 12.0 - 15.0 g/dL   HCT 42.7 36.0 - 46.0 %   MCV 93.6 78.0 - 100.0 fL   MCH 31.6 26.0 - 34.0 pg   MCHC 33.7 30.0 - 36.0 g/dL   RDW 13.8 11.5 - 15.5 %   Platelets 219 150 - 400 K/uL  Basic metabolic panel     Status: Abnormal   Collection Time: 06/20/15  3:20 PM  Result Value Ref Range   Sodium 138 135 - 145 mmol/L   Potassium 4.1 3.5 - 5.1 mmol/L   Chloride 106 101 - 111 mmol/L   CO2 24 22 - 32 mmol/L   Glucose, Bld 83 65 - 99 mg/dL   BUN 11 6 - 20 mg/dL   Creatinine, Ser 1.02 (H) 0.44 - 1.00 mg/dL   Calcium 9.0 8.9 - 10.3 mg/dL   GFR calc non Af Amer 60 (L) >60 mL/min   GFR calc Af Amer >60 >60 mL/min    Comment: (NOTE) The eGFR has been calculated using the CKD EPI equation. This calculation has not been validated in all clinical situations. eGFR's persistently <60 mL/min signify possible Chronic Kidney Disease.    Anion gap 8 5 - 15   No results found.  Review of Systems  Constitutional: Negative.   Eyes: Negative.   Respiratory: Negative.   Cardiovascular: Negative.   Gastrointestinal: Negative.   Musculoskeletal: Positive for  myalgias and neck pain.  Skin: Negative.   Neurological: Positive for dizziness, sensory change and headaches.  Psychiatric/Behavioral: Negative.     Blood pressure 143/87, pulse 86, temperature 98.3 F (36.8 C), temperature source Oral, resp. rate 20, height 5' 2.5" (1.588 m), weight 73.653 kg (162 lb 6 oz), SpO2 98 %. Physical Exam  Constitutional: She is oriented to person, place, and time. She appears well-developed and well-nourished.  HENT:  Head: Normocephalic.  Eyes: Pupils are equal, round, and reactive to light.  Neck: Normal range of motion.  Respiratory: Effort normal.  GI: Soft. Bowel sounds are normal.  Neurological: She is alert and oriented to person, place, and time. She has normal strength. GCS eye subscore is 4. GCS verbal subscore is 5. GCS motor subscore is 6.  Strength is 5 out of 5 in her deltoid, bicep, tricep, wrist flexion, wrist extension, and 4+ out of 5 intrinsics on the left, triceps are 4-4+ out of 5 in the right otherwise 5 out of 5 except and intrinsics on the right are also 4+ out of 5  Skin: Skin is warm and dry.     Assessment/Plan 59 Year female presents for two-level anterior cervical corpectomy and 1 level discectomy.  [name] P 06/22/2015, 2:07 PM

## 2015-06-22 NOTE — Transfer of Care (Signed)
Immediate Anesthesia Transfer of Care Note  Patient: [name]  Procedure(s) Performed: Procedure(s): ANTERIOR CERVICAL DECOMPRESSION/DISCECTOMY FUSION C6 - C7 with corpectomy (N/A)  Patient Location: PACU  Anesthesia Type:General  Level of Consciousness: awake, alert , oriented and patient cooperative  Airway & Oxygen Therapy: Patient Spontanous Breathing and Patient connected to nasal cannula oxygen  Post-op Assessment: Report given to RN, Post -op Vital signs reviewed and stable and Patient moving all extremities  Post vital signs: Reviewed and stable  Last Vitals:  Filed Vitals:   06/22/15 1945 06/22/15 2043  BP:  100/61  Pulse:  111  Temp: 36.9 C 36.9 C  Resp:  16    Complications: No apparent anesthesia complications

## 2015-06-23 NOTE — Progress Notes (Signed)
Patient ID: Jo LaughterLinda Owens, female   DOB: 05-09-1956, 59 y.o.   MRN: [id number] Patient is doing great significant improvement in upper extremity strength numbness and pain. Condition of dysphagia.  Strength improved to 5 out of 5 in her right tricep and intrinsics also 5 out of 5 incision clean dry and intact  JP put out 70 mL since surgery will leave it in another day  Continue to observe mobilize with physical and occupational therapy

## 2015-06-23 NOTE — Evaluation (Signed)
Physical Therapy Evaluation Patient Details Name: Jo LaughterLinda Sawyers MRN: [id number] DOB: 1955-09-16 Today's Date: 06/23/2015   History of Present Illness  Anterior cervical corpectomies of C4 and C5 as well as anterior cervical discectomy at C6-7 with microdissection. Anterior cervical strut using the NIKO globus peek cage packed with locally harvested autograft mixed with DBX mix from C3-C6 and allograft at C6-7. Anterior cervical plating from C3-C7 using utilizing the globus extend plating system with 16 mm bicortical screws at C3 C6 and C7  Clinical Impression  Pt admitted with above diagnosis and presents to PT with functional limitations due to deficits listed below (See PT problem list). Pt needs skilled PT to maximize independence and safety to allow discharge to home.      Follow Up Recommendations No PT follow up    Equipment Recommendations  Rolling walker with 5" wheels (possibly)    Recommendations for Other Services       Precautions / Restrictions Precautions Precautions: Fall;Other (comment);Cervical (watch O2 sats) Required Braces or Orthoses: Cervical Brace Cervical Brace: At all times;Hard collar      Mobility  Bed Mobility Overal bed mobility: Needs Assistance Bed Mobility: Rolling;Supine to Sit Rolling: Supervision   Supine to sit: Supervision     General bed mobility comments: vc for correct technique  Transfers Overall transfer level: Needs assistance Equipment used: 1 person hand held assist Transfers: Sit to/from Stand Sit to Stand: Min guard            Ambulation/Gait Ambulation/Gait assistance: Min guard Ambulation Distance (Feet): 120 Feet Assistive device: Rolling walker (2 wheeled) Gait Pattern/deviations: Step-through pattern;Decreased step length - right;Decreased step length - left Gait velocity: decr Gait velocity interpretation: Below normal speed for age/gender General Gait Details: Hesitant guarded gait.  Stairs             Wheelchair Mobility    Modified Rankin (Stroke Patients Only)       Balance Overall balance assessment: Needs assistance Sitting-balance support: No upper extremity supported;Feet supported Sitting balance-Leahy Scale: Good     Standing balance support: No upper extremity supported;During functional activity Standing balance-Leahy Scale: Fair                               Pertinent Vitals/Pain Pain Assessment: Faces Pain Score: 6  Faces Pain Scale: Hurts even more Pain Location: neck Pain Descriptors / Indicators: Grimacing Pain Intervention(s): Limited activity within patient's tolerance    Home Living Family/patient expects to be discharged to:: Private residence Living Arrangements: Alone Available Help at Discharge: Family;Available PRN/intermittently Type of Home: Apartment Home Access: Stairs to enter Entrance Stairs-Rails: Right Entrance Stairs-Number of Steps: 16 Home Layout: One level Home Equipment: Shower seat      Prior Function Level of Independence: Independent               Hand Dominance   Dominant Hand: Right    Extremity/Trunk Assessment   Upper Extremity Assessment: Defer to OT evaluation           Lower Extremity Assessment: Overall WFL for tasks assessed      Cervical / Trunk Assessment: Other exceptions (cervical surgery)  Communication   Communication: No difficulties  Cognition Arousal/Alertness: Awake/alert Behavior During Therapy: WFL for tasks assessed/performed Overall Cognitive Status: Within Functional Limits for tasks assessed                      General Comments  Exercises        Assessment/Plan    PT Assessment Patient needs continued PT services  PT Diagnosis Difficulty walking   PT Problem List Decreased balance;Decreased mobility;Pain  PT Treatment Interventions DME instruction;Gait training;Stair training;Functional mobility training;Therapeutic  activities;Therapeutic exercise;Patient/family education   PT Goals (Current goals can be found in the Care Plan section) Acute Rehab PT Goals Patient Stated Goal: to be able to take care of myself PT Goal Formulation: With patient Time For Goal Achievement: 06/30/15 Potential to Achieve Goals: Good    Frequency Min 5X/week   Barriers to discharge        Co-evaluation               End of Session Equipment Utilized During Treatment: Cervical collar Activity Tolerance: Patient tolerated treatment well Patient left: in chair;with call bell/phone within reach           Time: 1135-1153 PT Time Calculation (min) (ACUTE ONLY): 18 min   Charges:   PT Evaluation $Initial PT Evaluation Tier I: 1 Procedure     PT G Codes:        Tela Kotecki 12-Jul-2015, 12:46 PM [facility] PT [phone]

## 2015-06-23 NOTE — Progress Notes (Signed)
Occupational Therapy Evaluation Patient Details Name: [name] MRN: [id number] DOB: [birth year] Today's Date: 06/23/2015    History of Present Illness Anterior cervical corpectomies of C4 and C5 as well as anterior cervical discectomy at C6-7 with microdissection. Anterior cervical strut using the NIKO globus peek cage packed with locally harvested autograft mixed with DBX mix from C3-C6 and allograft at C6-7. Anterior cervical plating from C3-C7 using utilizing the globus extend plating system with 16 mm bicortical screws at C3 C6 and C7   Clinical Impression   PTA, pt lived alone and was independent with ADL and mobility. Pt moving well. O2 desat to 85 with activity on RA. Began education on compensatory techniques with use of AE/DME for ADL and functional mobility for ADL. Will follow to complete education. Pt may need home O2 if O2 Sats do not improve. Will monitor.     Follow Up Recommendations  No OT follow up;Supervision - Intermittent    Equipment Recommendations  3 in 1 bedside comode    Recommendations for Other Services       Precautions / Restrictions Precautions Precautions: Fall;Other (comment);Cervical (watch O2 sats)      Mobility Bed Mobility Overal bed mobility: Needs Assistance Bed Mobility: Rolling;Supine to Sit Rolling: Supervision   Supine to sit: Supervision     General bed mobility comments: vc for correct technique  Transfers Overall transfer level: Needs assistance Equipment used: 1 person hand held assist Transfers: Sit to/from Stand Sit to Stand: Min guard              Balance Overall balance assessment: Needs assistance   Sitting balance-Leahy Scale: Good       Standing balance-Leahy Scale: Fair                              ADL Overall ADL's : Needs assistance/impaired     Grooming: Supervision/safety;Set up;Standing   Upper Body Bathing: Supervision/ safety;Set up;Sitting   Lower Body Bathing: Min  guard;Sit to/from stand   Upper Body Dressing : Minimal assistance Upper Body Dressing Details (indicate cue type and reason): to donn Aspen collar Lower Body Dressing: Minimal assistance   Toilet Transfer: Minimal assistance;BSC;Ambulation (over toilet)   Toileting- Clothing Manipulation and Hygiene: Supervision/safety;Sitting/lateral lean       Functional mobility during ADLs: Minimal assistance (steady A) General ADL Comments: Began education regarding copensatory techniques for ADL. Pt states MD is ordering a :shower collar" for her to wear. Discussed use of DME fr ADL. Pt requesting 3 in 1 due to lower toilet height. Recommend pt have reacher for home.  Began education on proper home set up to facilitate independence. Pt has set up much of this prior to surgery.      Vision     Perception     Praxis      Pertinent Vitals/Pain Pain Assessment: 0-10 Pain Score: 6  Pain Location: neck Pain Descriptors / Indicators: Aching Pain Intervention(s): Limited activity within patient's tolerance;Repositioned     Hand Dominance Right   Extremity/Trunk Assessment Upper Extremity Assessment Upper Extremity Assessment: Overall WFL for tasks assessed;Difficult to assess due to impaired cognition (states strength has improved since surgery)   Lower Extremity Assessment Lower Extremity Assessment: Overall WFL for tasks assessed   Cervical / Trunk Assessment Cervical / Trunk Assessment: Other exceptions (cervical curgery)   Communication Communication Communication: No difficulties   Cognition Arousal/Alertness: Awake/alert Behavior During Therapy: WFL for tasks assessed/performed Overall Cognitive  Status: Within Functional Limits for tasks assessed                     General Comments       Exercises       Shoulder Instructions      Home Living Family/patient expects to be discharged to:: Private residence Living Arrangements: Alone Available Help at  Discharge: Family;Available PRN/intermittently Type of Home: Apartment Home Access: Stairs to enter Entrance Stairs-Number of Steps: 16 Entrance Stairs-Rails: Right Home Layout: One level     Bathroom Shower/Tub: Tub/shower unit Shower/tub characteristics: Engineer, building services: Standard Bathroom Accessibility: Yes How Accessible: Accessible via walker Home Equipment: Shower seat          Prior Functioning/Environment Level of Independence: Independent             OT Diagnosis: Generalized weakness;Acute pain   [name], OTR/L  [phone] 12-Dec-2016OT Problem List: Decreased strength;Decreased range of motion;Decreased activity tolerance;Decreased knowledge of use of DME or AE;Decreased knowledge of precautions;Cardiopulmonary status limiting activity;Pain   OT Treatment/Interventions: Self-care/ADL training;DME and/or AE instruction;Therapeutic activities;Patient/family education    OT Goals(Current goals can be found in the care plan section) Acute Rehab OT Goals Patient Stated Goal: to be able to take care of myself OT Goal Formulation: With patient Time For Goal Achievement: 06/30/15 Potential to Achieve Goals: Good  OT Frequency: Min 2X/week   Barriers to D/C:            Co-evaluation              End of Session Equipment Utilized During Treatment: Gait belt;Cervical collar Nurse Communication: Mobility status;Other (comment) (O2 SATS)  Activity Tolerance: Patient tolerated treatment well Patient left: in chair;with call bell/phone within reach   Time: 4540-9811 OT Time Calculation (min): 33 min Charges:  OT General Charges $OT Visit: 1 Procedure OT Evaluation $Initial OT Evaluation Tier I: 1 Procedure OT Treatments $Self Care/Home Management : 8-22 mins G-Codes:    [name],[name] 07-11-2015, 11:49 AM

## 2015-06-24 MED ORDER — FLUTICASONE FUROATE-VILANTEROL 200-25 MCG/INH IN AEPB: 1.0000 | INHALATION_SPRAY | Freq: Every day | RESPIRATORY_TRACT | Status: DC

## 2015-06-24 NOTE — Progress Notes (Signed)
Occupational Therapy Treatment Patient Details Name: [name] MRN: [id number] DOB: [birth year] Today's Date: 06/24/2015    History of present illness Pt is a 59 y.o. female s/p anterior cervical corpectomies of C4 and C5 as well as anterior cervical discectomy at C6-7 with microdissection. Anterior cervical strut using the NIKO globus peek cage packed with locally harvested autograft mixed with DBX mix from C3-C6 and allograft at C6-7. Anterior cervical plating from C3-C7 using utilizing the globus extend plating system with 16 mm bicortical screws at C3 C6 and C7   OT comments  Pt progressing very well with mobility and ADL performance. Pt demonstrated understanding of cervical precautions and proper use of AE. Discussed safety precautions of using O2 at home if she is d/c with this. All education completed and pt has no further acute OT needs. OT signing off.   Follow Up Recommendations  No OT follow up;Supervision - Intermittent    Equipment Recommendations  3 in 1 bedside comode;Other (comment) (RW-2 wheeled)    Recommendations for Other Services      Precautions / Restrictions Precautions Precautions: Cervical;Fall Required Braces or Orthoses: Cervical Brace Cervical Brace: At all times;Hard collar Restrictions Weight Bearing Restrictions: No       Mobility Bed Mobility Overal bed mobility: Modified Independent Bed Mobility: Rolling;Sidelying to Sit Rolling: Modified independent (Device/Increase time) Sidelying to sit: Modified independent (Device/Increase time)       General bed mobility comments: Pt received in chair  Transfers Overall transfer level: Modified independent Equipment used: Rolling walker (2 wheeled) Transfers: Sit to/from Stand Sit to Stand: Modified independent (Device/Increase time)              Balance Overall balance assessment: Needs assistance Sitting-balance support: No upper extremity supported;Feet supported Sitting balance-Leahy  Scale: Good     Standing balance support: No upper extremity supported;During functional activity Standing balance-Leahy Scale: Fair                     ADL Overall ADL's : Needs assistance/impaired                     Lower Body Dressing: Modified independent;With adaptive equipment;Adhering to back precautions;Sitting/lateral leans   Toilet Transfer: Modified Independent;Ambulation;BSC;RW   Toileting- Clothing Manipulation and Hygiene: Modified independent;Sit to/from stand;Adhering to back precautions   Tub/ Shower Transfer: Tub transfer;Supervision/safety;Adhering to back precautions;Ambulation;Grab bars;Rolling walker   Functional mobility during ADLs: Modified independent;Rolling walker General ADL Comments: Continued practice with compensatory techniques and reacher for LB ADL. Unable to walk down to gym due to pt's unstable O2 levels - simulated tub transfer in room with grab bar and pt required supervision level assist. Pt demonstrating good adherence to cervical precautions and safe use of AE and DME. Pt reports that MD will be getting her a soft collar for bathing and a new hard collar brace - pt verbalized competence with don/doff brace. Pt may be d/c home with O2, so discussed safety precautions with pt (awareness of tubing, no smoking, how to read tank levels).      Vision                     Perception     Praxis      Cognition   Behavior During Therapy: [facility] for tasks assessed/performed Overall Cognitive Status: Within Functional Limits for tasks assessed  Extremity/Trunk Assessment               Exercises     Shoulder Instructions       General Comments      Pertinent Vitals/ Pain       Pain Assessment: 0-10 Pain Score: 5  Faces Pain Scale: Hurts little more Pain Location: Neck and shoulders Pain Descriptors / Indicators: Aching;Tightness Pain Intervention(s): Limited activity within  patient's tolerance;Monitored during session;Repositioned  Home Living                                          Prior Functioning/Environment              Frequency       Progress Toward Goals  OT Goals(current goals can now be found in the care plan section)  Progress towards OT goals: Goals met/education completed, patient discharged from OT  Acute Rehab OT Goals Patient Stated Goal: to heal well OT Goal Formulation: With patient Time For Goal Achievement: 06/30/15 Potential to Achieve Goals: Good ADL Goals Pt Will Perform Lower Body Bathing: with modified independence;sit to/from stand Pt Will Perform Upper Body Dressing: with modified independence;bed level Pt Will Perform Lower Body Dressing: with modified independence;sit to/from stand Pt Will Perform Tub/Shower Transfer: with supervision;ambulating;shower seat Additional ADL Goal #1: Pt will independently states 3/3  cervical precautions Additional ADL Goal #2: Pt will independently donn/doff cervical collars  Plan All goals met and education completed, patient discharged from OT services    Co-evaluation                 End of Session Equipment Utilized During Treatment: Gait belt;Rolling walker;Cervical collar   Activity Tolerance Patient tolerated treatment well   Patient Left in chair;with call bell/phone within reach   Nurse Communication Mobility status        Time: 9675-9163 OT Time Calculation (min): 27 min  Charges: OT General Charges $OT Visit: 1 Procedure OT Treatments $Therapeutic Activity: 23-37 mins  Redmond Baseman 06/24/2015, 12:22 PM

## 2015-06-24 NOTE — Progress Notes (Signed)
Patient ID: Jo LaughterLinda Owens, female   DOB: [birth year], 59 y.o.   MRN: [id number] Seems to be doing well. Not much pain. She does drop her sats when she gets out of bed. She is on 2 L is a cannula oxygen. Moves all extremities well. Dressing dry.

## 2015-06-24 NOTE — Progress Notes (Signed)
Physical Therapy Treatment Patient Details Name: Jo LaughterLinda Owens MRN: [id number] DOB: 1956-02-06 Today's Date: 06/24/2015    History of Present Illness Anterior cervical corpectomies of C4 and C5 as well as anterior cervical discectomy at C6-7 with microdissection. Anterior cervical strut using the NIKO globus peek cage packed with locally harvested autograft mixed with DBX mix from C3-C6 and allograft at C6-7. Anterior cervical plating from C3-C7 using utilizing the globus extend plating system with 16 mm bicortical screws at C3 C6 and C7    PT Comments    Pt making progress with mobility. Pt with decr SpO2 with mobility.  Follow Up Recommendations  No PT follow up     Equipment Recommendations  Rolling walker with 5" wheels;3in1 (PT)    Recommendations for Other Services       Precautions / Restrictions Precautions Precautions: Fall;Other (comment);Cervical (watch O2 sats) Required Braces or Orthoses: Cervical Brace Cervical Brace: At all times;Hard collar Restrictions Weight Bearing Restrictions: No    Mobility  Bed Mobility Overal bed mobility: Modified Independent Bed Mobility: Rolling;Sidelying to Sit Rolling: Modified independent (Device/Increase time) Sidelying to sit: Modified independent (Device/Increase time)          Transfers Overall transfer level: Modified independent Equipment used: Rolling walker (2 wheeled) Transfers: Sit to/from Stand Sit to Stand: Modified independent (Device/Increase time)            Ambulation/Gait Ambulation/Gait assistance: Supervision Ambulation Distance (Feet): 175 Feet Assistive device: Rolling walker (2 wheeled) Gait Pattern/deviations: Step-through pattern;Decreased stride length Gait velocity: decr Gait velocity interpretation: Below normal speed for age/gender General Gait Details: Pt with decr SpO2 on RA with amb 85%.    Stairs            Wheelchair Mobility    Modified Rankin (Stroke Patients Only)       Balance   Sitting-balance support: No upper extremity supported;Feet supported Sitting balance-Leahy Scale: Good     Standing balance support: No upper extremity supported Standing balance-Leahy Scale: Fair                      Cognition Arousal/Alertness: Awake/alert Behavior During Therapy: WFL for tasks assessed/performed Overall Cognitive Status: Within Functional Limits for tasks assessed                      Exercises      General Comments        Pertinent Vitals/Pain Pain Assessment: Faces Faces Pain Scale: Hurts little more Pain Location: neck Pain Descriptors / Indicators: Operative site guarding Pain Intervention(s): Limited activity within patient's tolerance;Monitored during session    Home Living                      Prior Function            PT Goals (current goals can now be found in the care plan section) Acute Rehab PT Goals Patient Stated Goal: to be able to take care of myself PT Goal Formulation: With patient Time For Goal Achievement: 06/30/15 Potential to Achieve Goals: Good Progress towards PT goals: Progressing toward goals    Frequency  Min 5X/week    PT Plan Current plan remains appropriate    Co-evaluation             End of Session Equipment Utilized During Treatment: Cervical collar Activity Tolerance: Patient tolerated treatment well Patient left: in chair;with call bell/phone within reach     Time: [id number] PT Time  Calculation (min) (ACUTE ONLY): 22 min  Charges:  $Gait Training: 8-22 mins                    G Codes:      [name] 2015-07-01, 11:24 AM Skip Mayer PT [phone]

## 2015-06-24 NOTE — Progress Notes (Signed)
SATURATION QUALIFICATIONS: (This note is used to comply with regulatory documentation for home oxygen)  Patient Saturations on Room Air at Rest = 90%  Patient Saturations on Room Air while Ambulating = 85%  Patient Saturations on 3 Liters of oxygen while Ambulating = 90%  Please briefly explain why patient needs home oxygen: Needs supplemental O2 to maintain safe oxygen level.  [employer] CorporationCary Anett Ranker PT [phone]

## 2015-06-24 NOTE — Care Management Note (Signed)
Case Management Note  Patient Details  Name: Anapaula Severt MRN: [id number] Date of Birth: 1955/09/15  Subjective/Objective:                    Action/Plan: CM spoke with Dr Ronnald Ramp regarding patient's discharge needs. Patient has documented qualifying saturations for home oxygen therapy.  She also needs a 3n1 and rolling walker for anticipated discharge over the weekend.  Orders were placed. CM met with patient, who would like to use [facility] for equipment. Tiffany with [facility] DME was notified of need for equipment for possible discharge tomorrow 06/25/15.  Expected Discharge Date:                  Expected Discharge Plan:  Home/Self Care  In-House Referral:     Discharge planning Services  CM Consult  Post Acute Care Choice:  Durable Medical Equipment Choice offered to:  Patient  DME Arranged:  3-N-1, Walker rolling, Oxygen DME Agency:  [place]:    [place]:     Status of Service:  Completed, signed off  Medicare Important Message Given:    Date Medicare IM Given:    Medicare IM give by:    Date Additional Medicare IM Given:    Additional Medicare Important Message give by:     If discussed at [place] of Stay Meetings, dates discussed:    Additional Comments:  Rolm Baptise, RN 06/24/2015, 12:33 PM [phone]

## 2015-06-25 ENCOUNTER — Inpatient Hospital Stay (HOSPITAL_COMMUNITY): Payer: 59

## 2015-06-25 MED ORDER — METHOCARBAMOL 750 MG PO TABS: 500.0000 mg | ORAL_TABLET | Freq: Four times a day (QID) | ORAL | Status: DC

## 2015-06-25 MED ORDER — OXYCODONE-ACETAMINOPHEN 5-325 MG PO TABS: 1.0000 | ORAL_TABLET | ORAL | Status: DC | PRN

## 2015-06-25 NOTE — Progress Notes (Signed)
Received call that pt sat were dropping to 88% while ambulating and will need oxygen for home. Contacted [facility] for oxygen for home. Isidoro DonningAlesia Taraoluwa Thakur RN CCM Case Mgmt phone [phone]

## 2015-06-25 NOTE — Progress Notes (Signed)
SATURATION QUALIFICATIONS: (This note is used to comply with regulatory documentation for home oxygen)  Patient Saturations on Room Air at Rest = 89%  Patient Saturations on Room Air while Ambulating = 88%  Patient Saturations on 2 Liters of oxygen while Ambulating = 94%  Please briefly explain why patient needs home oxygen: Desaturation while walking.

## 2015-06-25 NOTE — Progress Notes (Deleted)
Pt walked in the hallway on room air with O2 sat monitoring on finger prob, saturation is 88% most of the time .

## 2015-06-25 NOTE — Discharge Instructions (Signed)
No lifting no bending no twisting no driving a riding a car less she has come back and forth to see me. Keep the incision clean dry and intact.

## 2015-06-25 NOTE — Progress Notes (Addendum)
Contacted [facility] for delivery of DME, RW and 3n1 bedside commode for scheduled dc home today. Pt sats 90% while ambulating. Oxygen for home not needed. Pt reports she has her children and her sisters at home to assist with her care. Medication list reviewed. No assistance with medication needed.  Isidoro DonningAlesia Thermon Zulauf RN CCM Case Mgmt phone [phone]

## 2015-06-25 NOTE — Progress Notes (Signed)
Orthopedic Tech Progress Note Patient Details:  Merlene LaughterLinda Hanley 1956/01/18 [id number]  Ortho Devices Type of Ortho Device: [place] cervical collar Ortho Device/Splint Interventions: Application   Saul FordyceJennifer C Zerrick Hanssen 06/25/2015, 10:34 AM

## 2015-06-25 NOTE — Progress Notes (Signed)
SATURATION QUALIFICATIONS: (This note is used to comply with regulatory documentation for home oxygen)  Patient Saturations on Room Air at Rest = 91%  Patient Saturations on Room Air while Ambulating = 90-93%  Patient Saturations on N/A Liters of oxygen while Ambulating = N/A  Please briefly explain why patient needs home oxygen: Pt with adequate oxygenation while amb on RA.  [employer] CorporationCary Lizzie An PT [phone]

## 2015-06-25 NOTE — Progress Notes (Signed)
Subjective: Patient reports Doing well swollen swallowing improving minimal neck pain and some low back pain strength improving upper extremities did have some shortness of breath when walking yesterday  Objective: Vital signs in last 24 hours: Temp:  [98.2 F (36.8 C)-98.9 F (37.2 C)] 98.2 F (36.8 C) (11/26 0541) Pulse Rate:  [83-104] 83 (11/26 0541) Resp:  [18-20] 20 (11/26 0541) BP: (94-136)/(58-78) 136/78 mmHg (11/26 0541) SpO2:  [87 %-95 %] 95 % (11/26 0541)  Intake/Output from previous day: 11/25 0701 - 11/26 0700 In: -  Out: 30 [Drains:30] Intake/Output this shift:    Strength out of 5 wound clean dry and intact  Lab Results: No results for input(s): WBC, HGB, HCT, PLT in the last 72 hours. BMET No results for input(s): NA, K, CL, CO2, GLUCOSE, BUN, CREATININE, CALCIUM in the last 72 hours.  Studies/Results: No results found.  Assessment/Plan: DC J-P drain, mobilize again and check room air saturations, checkup verbal chest x-ray. We'll plan discharge home if patient ambulating without drops in saturations and just x-rays okay.  LOS: 3 days     [name] P 06/25/2015, 8:10 AM

## 2015-06-25 NOTE — Progress Notes (Signed)
Physical Therapy Discharge Patient Details Name: [name] MRN: [id number] DOB: January 02, 1956 Today's Date: 06/25/2015 Time: 3903-0092 PT Time Calculation (min) (ACUTE ONLY): 26 min  Patient discharged from PT services secondary to goals met and no further PT needs identified.  Please see latest therapy progress note for current level of functioning and progress toward goals.    Progress and discharge plan discussed with patient and/or caregiver: Patient/Caregiver agrees with plan  GP     [facility] 06/25/2015, 8:46 AM

## 2015-06-25 NOTE — Discharge Summary (Signed)
Physician Discharge Summary  Patient ID: Lucille Witts MRN: [id number] DOB/AGE: June 15, 1956 59 y.o.  Admit date: 06/22/2015 Discharge date: 06/25/2015  Admission Diagnoses: Cervical spondylitic myelopathy  Discharge Diagnoses: Same Active Problems:   Myelopathy, spondylogenic, cervical   Discharged Condition: good  Hospital Course: Patient is very pleasant 59 year female is a spinal cord compression severe myelopathy with was made to the hospital underwent anterior cervical corpectomies of C4 and C5 as well as a discectomy at C6-7 postoperatively patient did very well recovered in the floor on the floor was angling and voiding spontaneous he tolerating regular diet swallowing well was progressively mobilized and will be discharged as long as she maintains her saturations and her chest x-ray is okay.  Consults: Significant Diagnostic Studies: Treatments: ACC F C4-C5 ACDF C6-7 Discharge Exam: Blood pressure 136/78, pulse 83, temperature 98.2 F (36.8 C), temperature source Oral, resp. rate 20, height 5' 2.5" (1.588 m), weight 73.653 kg (162 lb 6 oz), SpO2 95 %. Strength out of 5 wound clean dry and intact  Disposition: Home     Medication List    TAKE these medications        BREO ELLIPTA 200-25 MCG/INH Aepb  Generic drug:  Fluticasone Furoate-Vilanterol  Inhale 1 puff into the lungs daily.     buPROPion 100 MG 12 hr tablet  Commonly known as:  WELLBUTRIN SR  Take 100 mg by mouth 2 (two) times daily.     citalopram 40 MG tablet  Commonly known as:  CELEXA  Take 40 mg by mouth daily.     COMBIVENT RESPIMAT 20-100 MCG/ACT Aers respimat  Generic drug:  Ipratropium-Albuterol  Inhale 2 puffs into the lungs daily.     fluticasone 50 MCG/ACT nasal spray  Commonly known as:  FLONASE  Place 1 spray into both nostrils daily.     furosemide 20 MG tablet  Commonly known as:  LASIX  Take 1 tablet (20 mg total) by mouth daily.     guaiFENesin 600 MG 12 hr tablet  Commonly  known as:  MUCINEX  Take 600 mg by mouth 2 (two) times daily.     HM VITAMIN D3 4000 UNITS Caps  Generic drug:  Cholecalciferol  Take by mouth daily as needed.     indomethacin 75 MG CR capsule  Commonly known as:  INDOCIN SR  Take 75 mg by mouth 2 (two) times daily.     methocarbamol 750 MG tablet  Commonly known as:  ROBAXIN  Take 0.5 tablets (375 mg total) by mouth 4 (four) times daily.     omeprazole 20 MG capsule  Commonly known as:  PRILOSEC  Take 1 capsule by mouth daily.     OVER THE COUNTER MEDICATION  Take 1 tablet by mouth 2 (two) times daily.     oxyCODONE-acetaminophen 5-325 MG tablet  Commonly known as:  PERCOCET/ROXICET  Take 1-2 tablets by mouth every 4 (four) hours as needed for moderate pain.     PREMPRO 0.3-1.5 MG tablet  Generic drug:  estrogen (conjugated)-medroxyprogesterone  Take 1 tablet by mouth daily.      ASK your doctor about these medications        traMADol 50 MG tablet  Commonly known as:  ULTRAM  Take 2 tablets by mouth 2 (two) times daily as needed for moderate pain.           Follow-up Information    Follow up with [facility] P, MD.   Specialty:  Neurosurgery   Contact information:  [address] [place] [address] [phone]       Signed: Mariam DollarCRAM,Natilee Gauer P 06/25/2015, 8:14 AM

## 2015-06-25 NOTE — Progress Notes (Signed)
All d/c instructions given, pt received all DME equipments needed. Dr.Cram informed about chest X ray result. Pt is going home with O2. Pain tolerable, pt is comfortable and no concerns.  Danne HarborJohny,RN

## 2015-06-25 NOTE — Progress Notes (Signed)
Physical Therapy Treatment Patient Details Name: [name] MRN: [id number] DOB: Nov 22, 1955 Today's Date: 06/25/2015    History of Present Illness Anterior cervical corpectomies of C4 and C5 as well as anterior cervical discectomy at C6-7 with microdissection. Anterior cervical strut using the NIKO globus peek cage packed with locally harvested autograft mixed with DBX mix from C3-C6 and allograft at C6-7. Anterior cervical plating from C3-C7 using utilizing the globus extend plating system with 16 mm bicortical screws at C3 C6 and C7    PT Comments    Pt doing well with mobility and no further PT needed.  Ready for dc from PT standpoint.    Follow Up Recommendations  No PT follow up     Equipment Recommendations  Rolling walker with 5" wheels;3in1 (PT)    Recommendations for Other Services       Precautions / Restrictions Precautions Precautions: Fall;Other (comment);Cervical (watch O2 sats) Required Braces or Orthoses: Cervical Brace Cervical Brace: At all times;Hard collar Restrictions Weight Bearing Restrictions: No    Mobility  Bed Mobility                  Transfers Overall transfer level: Modified independent Equipment used: Rolling walker (2 wheeled) Transfers: Sit to/from Stand Sit to Stand: Modified independent (Device/Increase time)            Ambulation/Gait Ambulation/Gait assistance: Modified independent (Device/Increase time) Ambulation Distance (Feet): 175 Feet Assistive device: Rolling walker (2 wheeled) Gait Pattern/deviations: Step-through pattern;Decreased stride length Gait velocity: decr Gait velocity interpretation: Below normal speed for age/gender General Gait Details: SpO2 >90% on RA with amb   Stairs Stairs: Yes Stairs assistance: Supervision Stair Management: One rail Left;Step to pattern;Forwards Number of Stairs: 5    Wheelchair Mobility    Modified Rankin (Stroke Patients Only)       Balance    Sitting-balance support: No upper extremity supported;Feet supported Sitting balance-Leahy Scale: Good     Standing balance support: No upper extremity supported Standing balance-Leahy Scale: Fair                      Cognition Arousal/Alertness: Awake/alert Behavior During Therapy: WFL for tasks assessed/performed Overall Cognitive Status: Within Functional Limits for tasks assessed                      Exercises      General Comments        Pertinent Vitals/Pain Pain Assessment: 0-10 Pain Score: 6  Pain Location: neck and between shoulder blades Pain Descriptors / Indicators: Burning Pain Intervention(s): Limited activity within patient's tolerance;Monitored during session    Home Living Family/patient expects to be discharged to:: Private residence Living Arrangements: Alone                  Prior Function            PT Goals (current goals can now be found in the care plan section) Acute Rehab PT Goals Patient Stated Goal: to be able to take care of myself PT Goal Formulation: With patient Time For Goal Achievement: 06/30/15 Potential to Achieve Goals: Good Progress towards PT goals: Goals met/education completed, patient discharged from PT    Frequency       PT Plan Current plan remains appropriate    Co-evaluation             End of Session Equipment Utilized During Treatment: Cervical collar Activity Tolerance: Patient tolerated treatment well Patient left: in  chair;with call bell/phone within reach     Time: 0816-0842 PT Time Calculation (min) (ACUTE ONLY): 26 min  Charges:  $Gait Training: 23-37 mins                    G Codes:      Samreen Seltzer 2015/07/20, 8:45 AM [facility] PT [phone]

## 2015-06-27 ENCOUNTER — Encounter (HOSPITAL_COMMUNITY): Payer: Self-pay | Admitting: Neurosurgery

## 2015-06-30 ENCOUNTER — Encounter (HOSPITAL_COMMUNITY): Payer: Self-pay | Admitting: Neurosurgery

## 2015-07-06 ENCOUNTER — Encounter (HOSPITAL_COMMUNITY): Payer: Self-pay | Admitting: *Deleted

## 2015-07-06 NOTE — Progress Notes (Signed)
Pt just had surgery on 06/22/15. She states nothing has changed with her medical and surgical history. Denies any recent chest pain. She does have sob and was sent home from hospital on O2 as needed.

## 2015-07-07 ENCOUNTER — Other Ambulatory Visit (HOSPITAL_COMMUNITY): Payer: 59

## 2015-07-08 ENCOUNTER — Inpatient Hospital Stay (HOSPITAL_COMMUNITY): Payer: 59 | Admitting: Anesthesiology

## 2015-07-08 ENCOUNTER — Inpatient Hospital Stay (HOSPITAL_COMMUNITY): Payer: 59

## 2015-07-08 ENCOUNTER — Encounter (HOSPITAL_COMMUNITY): Payer: Self-pay | Admitting: Critical Care Medicine

## 2015-07-08 ENCOUNTER — Inpatient Hospital Stay (HOSPITAL_COMMUNITY)
Admission: RE | Admit: 2015-07-08 | Discharge: 2015-07-18 | DRG: 473 | Disposition: A | Discharge status: Skilled Nursing Facility | Payer: 59 | Source: Ambulatory Visit | Attending: Neurosurgery | Admitting: Neurosurgery

## 2015-07-08 ENCOUNTER — Encounter (HOSPITAL_COMMUNITY)
Admission: RE | Disposition: A | Discharge status: Skilled Nursing Facility | Payer: Self-pay | Source: Ambulatory Visit | Attending: Neurosurgery

## 2015-07-08 DIAGNOSIS — J45909 Unspecified asthma, uncomplicated: Secondary | ICD-10-CM | POA: Diagnosis present

## 2015-07-08 DIAGNOSIS — J449 Chronic obstructive pulmonary disease, unspecified: Secondary | ICD-10-CM | POA: Diagnosis present

## 2015-07-08 DIAGNOSIS — I509 Heart failure, unspecified: Secondary | ICD-10-CM | POA: Diagnosis present

## 2015-07-08 DIAGNOSIS — F329 Major depressive disorder, single episode, unspecified: Secondary | ICD-10-CM | POA: Diagnosis present

## 2015-07-08 DIAGNOSIS — M4712 Other spondylosis with myelopathy, cervical region: Secondary | ICD-10-CM | POA: Diagnosis present

## 2015-07-08 DIAGNOSIS — M40209 Unspecified kyphosis, site unspecified: Secondary | ICD-10-CM | POA: Diagnosis present

## 2015-07-08 DIAGNOSIS — B379 Candidiasis, unspecified: Secondary | ICD-10-CM | POA: Diagnosis not present

## 2015-07-08 DIAGNOSIS — Z87891 Personal history of nicotine dependence: Secondary | ICD-10-CM

## 2015-07-08 DIAGNOSIS — M542 Cervicalgia: Secondary | ICD-10-CM | POA: Diagnosis present

## 2015-07-08 DIAGNOSIS — M1990 Unspecified osteoarthritis, unspecified site: Secondary | ICD-10-CM | POA: Diagnosis present

## 2015-07-08 DIAGNOSIS — M2550 Pain in unspecified joint: Secondary | ICD-10-CM | POA: Diagnosis present

## 2015-07-08 DIAGNOSIS — H409 Unspecified glaucoma: Secondary | ICD-10-CM | POA: Diagnosis present

## 2015-07-08 DIAGNOSIS — K219 Gastro-esophageal reflux disease without esophagitis: Secondary | ICD-10-CM | POA: Diagnosis present

## 2015-07-08 DIAGNOSIS — G8918 Other acute postprocedural pain: Secondary | ICD-10-CM | POA: Diagnosis not present

## 2015-07-08 DIAGNOSIS — Z419 Encounter for procedure for purposes other than remedying health state, unspecified: Secondary | ICD-10-CM

## 2015-07-08 HISTORY — PX: POSTERIOR FUSION CERVICAL SPINE: SUR628

## 2015-07-08 HISTORY — PX: POSTERIOR CERVICAL FUSION/FORAMINOTOMY: SHX5038

## 2015-07-08 LAB — BASIC METABOLIC PANEL
Anion gap: 7 (ref 5–15)
BUN: 8 mg/dL (ref 6–20)
CO2: 26 mmol/L (ref 22–32)
Calcium: 8.9 mg/dL (ref 8.9–10.3)
Chloride: 104 mmol/L (ref 101–111)
Creatinine, Ser: 0.97 mg/dL (ref 0.44–1.00)
GFR calc Af Amer: >60 mL/min (ref >60)
GFR calc non Af Amer: >60 mL/min (ref >60)
Glucose, Bld: 84 mg/dL (ref 65–99)
Potassium: 3.7 mmol/L (ref 3.5–5.1)
Sodium: 137 mmol/L (ref 135–145)

## 2015-07-08 LAB — CBC
HEMATOCRIT: 40.9 % (ref 36.0–46.0)
Hemoglobin: 13.5 g/dL (ref 12.0–15.0)
MCH: 31 pg (ref 26.0–34.0)
MCHC: 33 g/dL (ref 30.0–36.0)
MCV: 93.8 fL (ref 78.0–100.0)
Platelets: 292 10*3/uL (ref 150–400)
RBC: 4.36 MIL/uL (ref 3.87–5.11)
RDW: 13.6 % (ref 11.5–15.5)
WBC: 9.9 10*3/uL (ref 4.0–10.5)

## 2015-07-08 SURGERY — POSTERIOR CERVICAL FUSION/FORAMINOTOMY LEVEL 5
Anesthesia: General

## 2015-07-08 MED ORDER — SUCCINYLCHOLINE CHLORIDE 20 MG/ML IJ SOLN
INTRAMUSCULAR | Status: AC
  Filled 2015-07-08: qty 1

## 2015-07-08 MED ORDER — FENTANYL CITRATE (PF) 100 MCG/2ML IJ SOLN
INTRAMUSCULAR | Status: DC | PRN
  Administered 2015-07-08 (×3): 50 ug via INTRAVENOUS
  Administered 2015-07-08: 125 ug via INTRAVENOUS
  Administered 2015-07-08: 25 ug via INTRAVENOUS

## 2015-07-08 MED ORDER — ACETAMINOPHEN 325 MG PO TABS: 650.0000 mg | ORAL_TABLET | ORAL | Status: DC | PRN

## 2015-07-08 MED ORDER — PROPOFOL 10 MG/ML IV BOLUS
INTRAVENOUS | Status: AC
  Filled 2015-07-08: qty 20

## 2015-07-08 MED ORDER — CONJ ESTROG-MEDROXYPROGEST ACE 0.3-1.5 MG PO TABS
1.0000 | ORAL_TABLET | Freq: Every day | ORAL | Status: DC
  Administered 2015-07-08: 1 via ORAL

## 2015-07-08 MED ORDER — ESTROGENS CONJUGATED 0.3 MG PO TABS
0.3000 mg | ORAL_TABLET | Freq: Every day | ORAL | Status: DC
  Administered 2015-07-09 – 2015-07-18 (×10): 0.3 mg via ORAL
  Filled 2015-07-08 (×12): qty 1

## 2015-07-08 MED ORDER — DIPHENHYDRAMINE HCL 50 MG/ML IJ SOLN
INTRAMUSCULAR | Status: AC
  Filled 2015-07-08: qty 1

## 2015-07-08 MED ORDER — ACETAMINOPHEN 650 MG RE SUPP: 650.0000 mg | RECTAL | Status: DC | PRN

## 2015-07-08 MED ORDER — FENTANYL CITRATE (PF) 250 MCG/5ML IJ SOLN
INTRAMUSCULAR | Status: AC
  Filled 2015-07-08: qty 5

## 2015-07-08 MED ORDER — SODIUM CHLORIDE 0.9 % IJ SOLN
INTRAMUSCULAR | Status: AC
  Filled 2015-07-08: qty 10

## 2015-07-08 MED ORDER — LIDOCAINE HCL (CARDIAC) 20 MG/ML IV SOLN
INTRAVENOUS | Status: AC
  Filled 2015-07-08: qty 5

## 2015-07-08 MED ORDER — PROMETHAZINE HCL 25 MG/ML IJ SOLN: 6.2500 mg | INTRAMUSCULAR | Status: DC | PRN

## 2015-07-08 MED ORDER — BUDESONIDE-FORMOTEROL FUMARATE 160-4.5 MCG/ACT IN AERO
2.0000 | INHALATION_SPRAY | Freq: Two times a day (BID) | RESPIRATORY_TRACT | Status: DC
  Administered 2015-07-09 – 2015-07-10 (×3): 2 via RESPIRATORY_TRACT
  Filled 2015-07-08: qty 6

## 2015-07-08 MED ORDER — FUROSEMIDE 20 MG PO TABS
20.0000 mg | ORAL_TABLET | Freq: Every day | ORAL | Status: DC
  Administered 2015-07-09 – 2015-07-18 (×9): 20 mg via ORAL
  Filled 2015-07-08 (×10): qty 1

## 2015-07-08 MED ORDER — BUPIVACAINE HCL (PF) 0.25 % IJ SOLN
INTRAMUSCULAR | Status: DC | PRN
  Administered 2015-07-08: 17 mL

## 2015-07-08 MED ORDER — PROPOFOL 10 MG/ML IV BOLUS
INTRAVENOUS | Status: AC
Start: 2015-07-08 — End: 2015-07-08
  Filled 2015-07-08: qty 20

## 2015-07-08 MED ORDER — MIDAZOLAM HCL 5 MG/5ML IJ SOLN
INTRAMUSCULAR | Status: DC | PRN
  Administered 2015-07-08: 2 mg via INTRAVENOUS

## 2015-07-08 MED ORDER — VANCOMYCIN HCL 1000 MG IV SOLR
INTRAVENOUS | Status: AC
  Filled 2015-07-08: qty 1000

## 2015-07-08 MED ORDER — DIAZEPAM 5 MG/ML IJ SOLN
2.0000 mg | Freq: Once | INTRAMUSCULAR | Status: AC
Start: 2015-07-08 — End: 2015-07-08
  Administered 2015-07-08: 2 mg via INTRAVENOUS

## 2015-07-08 MED ORDER — OXYCODONE-ACETAMINOPHEN 5-325 MG PO TABS: 1.0000 | ORAL_TABLET | ORAL | Status: DC | PRN

## 2015-07-08 MED ORDER — DIAZEPAM 5 MG/ML IJ SOLN
INTRAMUSCULAR | Status: AC
  Filled 2015-07-08: qty 2

## 2015-07-08 MED ORDER — FLUTICASONE FUROATE-VILANTEROL 200-25 MCG/INH IN AEPB: 1.0000 | INHALATION_SPRAY | Freq: Every day | RESPIRATORY_TRACT | Status: DC

## 2015-07-08 MED ORDER — DEXAMETHASONE SODIUM PHOSPHATE 10 MG/ML IJ SOLN
INTRAMUSCULAR | Status: DC | PRN
  Administered 2015-07-08: 10 mg via INTRAVENOUS

## 2015-07-08 MED ORDER — LACTATED RINGERS IV SOLN
INTRAVENOUS | Status: DC | PRN
  Administered 2015-07-08 (×2): via INTRAVENOUS

## 2015-07-08 MED ORDER — DEXAMETHASONE SODIUM PHOSPHATE 4 MG/ML IJ SOLN
INTRAMUSCULAR | Status: AC
  Filled 2015-07-08: qty 1

## 2015-07-08 MED ORDER — PHENYLEPHRINE HCL 10 MG/ML IJ SOLN
10.0000 mg | INTRAVENOUS | Status: DC | PRN
  Administered 2015-07-08: 20 ug/min via INTRAVENOUS
  Administered 2015-07-08: 50 ug/min via INTRAVENOUS

## 2015-07-08 MED ORDER — EPHEDRINE SULFATE 50 MG/ML IJ SOLN
INTRAMUSCULAR | Status: AC
  Filled 2015-07-08: qty 1

## 2015-07-08 MED ORDER — SCOPOLAMINE 1 MG/3DAYS TD PT72
1.0000 | MEDICATED_PATCH | TRANSDERMAL | Status: DC
  Administered 2015-07-08: 1.5 mg via TRANSDERMAL
  Filled 2015-07-08: qty 1

## 2015-07-08 MED ORDER — SODIUM CHLORIDE 0.9 % IR SOLN
Status: DC | PRN
  Administered 2015-07-08: 08:00:00

## 2015-07-08 MED ORDER — PANTOPRAZOLE SODIUM 40 MG PO TBEC
40.0000 mg | DELAYED_RELEASE_TABLET | Freq: Every day | ORAL | Status: DC
  Administered 2015-07-09 – 2015-07-18 (×10): 40 mg via ORAL
  Filled 2015-07-08 (×10): qty 1

## 2015-07-08 MED ORDER — ARTIFICIAL TEARS OP OINT
TOPICAL_OINTMENT | OPHTHALMIC | Status: DC | PRN
  Administered 2015-07-08: 1 via OPHTHALMIC

## 2015-07-08 MED ORDER — HYDROMORPHONE HCL 1 MG/ML IJ SOLN
INTRAMUSCULAR | Status: AC
  Filled 2015-07-08: qty 1

## 2015-07-08 MED ORDER — ONDANSETRON HCL 4 MG/2ML IJ SOLN
INTRAMUSCULAR | Status: DC | PRN
  Administered 2015-07-08 (×2): 4 mg via INTRAVENOUS

## 2015-07-08 MED ORDER — MIDAZOLAM HCL 2 MG/2ML IJ SOLN
INTRAMUSCULAR | Status: AC
Start: 2015-07-08 — End: 2015-07-08
  Filled 2015-07-08: qty 2

## 2015-07-08 MED ORDER — ARTIFICIAL TEARS OP OINT
TOPICAL_OINTMENT | OPHTHALMIC | Status: AC
  Filled 2015-07-08: qty 3.5

## 2015-07-08 MED ORDER — IPRATROPIUM-ALBUTEROL 0.5-2.5 (3) MG/3ML IN SOLN
3.0000 mL | Freq: Two times a day (BID) | RESPIRATORY_TRACT | Status: DC
  Administered 2015-07-08 – 2015-07-18 (×18): 3 mL via RESPIRATORY_TRACT
  Filled 2015-07-08 (×20): qty 3

## 2015-07-08 MED ORDER — ROCURONIUM BROMIDE 50 MG/5ML IV SOLN
INTRAVENOUS | Status: AC
  Filled 2015-07-08: qty 1

## 2015-07-08 MED ORDER — CITALOPRAM HYDROBROMIDE 40 MG PO TABS
40.0000 mg | ORAL_TABLET | Freq: Every day | ORAL | Status: DC
  Administered 2015-07-09 – 2015-07-18 (×10): 40 mg via ORAL
  Filled 2015-07-08 (×10): qty 1

## 2015-07-08 MED ORDER — 0.9 % SODIUM CHLORIDE (POUR BTL) OPTIME
TOPICAL | Status: DC | PRN
  Administered 2015-07-08: 1000 mL

## 2015-07-08 MED ORDER — VITAMIN D 1000 UNITS PO TABS
4000.0000 [IU] | ORAL_TABLET | Freq: Every day | ORAL | Status: DC
  Administered 2015-07-09 – 2015-07-18 (×10): 4000 [IU] via ORAL
  Filled 2015-07-08 (×11): qty 4

## 2015-07-08 MED ORDER — CEFAZOLIN SODIUM-DEXTROSE 2-3 GM-% IV SOLR
2.0000 g | Freq: Three times a day (TID) | INTRAVENOUS | Status: AC
  Administered 2015-07-08 – 2015-07-10 (×6): 2 g via INTRAVENOUS
  Filled 2015-07-08 (×6): qty 50

## 2015-07-08 MED ORDER — DEXAMETHASONE SODIUM PHOSPHATE 4 MG/ML IJ SOLN
4.0000 mg | Freq: Once | INTRAMUSCULAR | Status: AC
  Administered 2015-07-08: 4 mg via INTRAVENOUS

## 2015-07-08 MED ORDER — GUAIFENESIN ER 600 MG PO TB12
600.0000 mg | ORAL_TABLET | Freq: Two times a day (BID) | ORAL | Status: DC
  Administered 2015-07-08 – 2015-07-18 (×19): 600 mg via ORAL
  Filled 2015-07-08 (×20): qty 1

## 2015-07-08 MED ORDER — METHOCARBAMOL 500 MG PO TABS
ORAL_TABLET | ORAL | Status: AC
  Filled 2015-07-08: qty 1

## 2015-07-08 MED ORDER — PROPOFOL 500 MG/50ML IV EMUL
INTRAVENOUS | Status: DC | PRN
  Administered 2015-07-08: 20 ug/kg/min via INTRAVENOUS

## 2015-07-08 MED ORDER — PHENOL 1.4 % MT LIQD: 1.0000 | OROMUCOSAL | Status: DC | PRN

## 2015-07-08 MED ORDER — LIDOCAINE-EPINEPHRINE 1 %-1:100000 IJ SOLN
INTRAMUSCULAR | Status: DC | PRN
  Administered 2015-07-08: 10 mL

## 2015-07-08 MED ORDER — ONDANSETRON HCL 4 MG/2ML IJ SOLN: 4.0000 mg | INTRAMUSCULAR | Status: DC | PRN

## 2015-07-08 MED ORDER — LIDOCAINE HCL (CARDIAC) 20 MG/ML IV SOLN
INTRAVENOUS | Status: DC | PRN
  Administered 2015-07-08: 100 mg via INTRATRACHEAL
  Administered 2015-07-08: 60 mg via INTRAVENOUS

## 2015-07-08 MED ORDER — VANCOMYCIN HCL 1000 MG IV SOLR
INTRAVENOUS | Status: DC | PRN
  Administered 2015-07-08: 1000 mg via TOPICAL

## 2015-07-08 MED ORDER — NEOSTIGMINE METHYLSULFATE 10 MG/10ML IV SOLN
INTRAVENOUS | Status: DC | PRN
  Administered 2015-07-08: 4 mg via INTRAVENOUS

## 2015-07-08 MED ORDER — MEDROXYPROGESTERONE ACETATE 2.5 MG PO TABS
1.2500 mg | ORAL_TABLET | Freq: Every day | ORAL | Status: DC
  Administered 2015-07-09 – 2015-07-18 (×10): 1.25 mg via ORAL
  Filled 2015-07-08 (×10): qty 0.5

## 2015-07-08 MED ORDER — SUCCINYLCHOLINE CHLORIDE 20 MG/ML IJ SOLN
INTRAMUSCULAR | Status: DC | PRN
  Administered 2015-07-08: 120 mg via INTRAVENOUS

## 2015-07-08 MED ORDER — BUPROPION HCL ER (SR) 100 MG PO TB12
100.0000 mg | ORAL_TABLET | Freq: Two times a day (BID) | ORAL | Status: DC
  Administered 2015-07-08 – 2015-07-18 (×20): 100 mg via ORAL
  Filled 2015-07-08 (×24): qty 1

## 2015-07-08 MED ORDER — HYDROMORPHONE HCL 1 MG/ML IJ SOLN
0.5000 mg | INTRAMUSCULAR | Status: DC | PRN
  Administered 2015-07-08 – 2015-07-14 (×11): 1 mg via INTRAVENOUS
  Filled 2015-07-08 (×11): qty 1

## 2015-07-08 MED ORDER — SODIUM CHLORIDE 0.9 % IV SOLN: 250.0000 mL | INTRAVENOUS | Status: DC

## 2015-07-08 MED ORDER — HYDROMORPHONE HCL 1 MG/ML IJ SOLN
0.2500 mg | INTRAMUSCULAR | Status: DC | PRN
  Administered 2015-07-08 (×4): 0.5 mg via INTRAVENOUS

## 2015-07-08 MED ORDER — GLYCOPYRROLATE 0.2 MG/ML IJ SOLN
INTRAMUSCULAR | Status: DC | PRN
  Administered 2015-07-08: 0.6 mg via INTRAVENOUS

## 2015-07-08 MED ORDER — SODIUM CHLORIDE 0.9 % IJ SOLN
3.0000 mL | Freq: Two times a day (BID) | INTRAMUSCULAR | Status: DC
Start: 2015-07-08 — End: 2015-07-18
  Administered 2015-07-09 – 2015-07-18 (×14): 3 mL via INTRAVENOUS

## 2015-07-08 MED ORDER — OXYCODONE-ACETAMINOPHEN 5-325 MG PO TABS
1.0000 | ORAL_TABLET | ORAL | Status: DC | PRN
  Administered 2015-07-08 – 2015-07-09 (×2): 2 via ORAL
  Administered 2015-07-09 (×2): 1 via ORAL
  Administered 2015-07-09 – 2015-07-14 (×18): 2 via ORAL
  Administered 2015-07-15 – 2015-07-16 (×8): 1 via ORAL
  Administered 2015-07-17 (×4): 2 via ORAL
  Administered 2015-07-17: 1 via ORAL
  Administered 2015-07-17 – 2015-07-18 (×5): 2 via ORAL
  Filled 2015-07-08: qty 2
  Filled 2015-07-08: qty 1
  Filled 2015-07-08 (×4): qty 2
  Filled 2015-07-08: qty 1
  Filled 2015-07-08: qty 2
  Filled 2015-07-08: qty 1
  Filled 2015-07-08 (×3): qty 2
  Filled 2015-07-08: qty 1
  Filled 2015-07-08 (×4): qty 2
  Filled 2015-07-08: qty 1
  Filled 2015-07-08 (×5): qty 2
  Filled 2015-07-08: qty 1
  Filled 2015-07-08 (×2): qty 2
  Filled 2015-07-08: qty 1
  Filled 2015-07-08 (×5): qty 2
  Filled 2015-07-08: qty 1
  Filled 2015-07-08: qty 2
  Filled 2015-07-08 (×3): qty 1
  Filled 2015-07-08 (×3): qty 2

## 2015-07-08 MED ORDER — CYCLOBENZAPRINE HCL 10 MG PO TABS
10.0000 mg | ORAL_TABLET | Freq: Three times a day (TID) | ORAL | Status: DC | PRN
  Administered 2015-07-16 – 2015-07-18 (×4): 10 mg via ORAL
  Filled 2015-07-08 (×7): qty 1

## 2015-07-08 MED ORDER — METHOCARBAMOL 500 MG PO TABS: 500.0000 mg | ORAL_TABLET | Freq: Four times a day (QID) | ORAL | Status: DC

## 2015-07-08 MED ORDER — METHOCARBAMOL 500 MG PO TABS
500.0000 mg | ORAL_TABLET | Freq: Four times a day (QID) | ORAL | Status: DC | PRN
  Administered 2015-07-08 – 2015-07-17 (×12): 500 mg via ORAL
  Filled 2015-07-08 (×13): qty 1

## 2015-07-08 MED ORDER — INDOMETHACIN ER 75 MG PO CPCR
75.0000 mg | ORAL_CAPSULE | Freq: Two times a day (BID) | ORAL | Status: DC
  Administered 2015-07-08 – 2015-07-18 (×20): 75 mg via ORAL
  Filled 2015-07-08 (×22): qty 1

## 2015-07-08 MED ORDER — ROCURONIUM BROMIDE 100 MG/10ML IV SOLN
INTRAVENOUS | Status: DC | PRN
  Administered 2015-07-08 (×3): 10 mg via INTRAVENOUS
  Administered 2015-07-08: 30 mg via INTRAVENOUS

## 2015-07-08 MED ORDER — SODIUM CHLORIDE 0.9 % IJ SOLN: 3.0000 mL | INTRAMUSCULAR | Status: DC | PRN

## 2015-07-08 MED ORDER — FLUTICASONE PROPIONATE 50 MCG/ACT NA SUSP
1.0000 | Freq: Every day | NASAL | Status: DC
  Administered 2015-07-09 – 2015-07-18 (×9): 1 via NASAL
  Filled 2015-07-08: qty 16

## 2015-07-08 MED ORDER — PROPOFOL 10 MG/ML IV BOLUS
INTRAVENOUS | Status: DC | PRN
  Administered 2015-07-08: 150 mg via INTRAVENOUS
  Administered 2015-07-08: 50 mg via INTRAVENOUS
  Administered 2015-07-08: 40 mg via INTRAVENOUS

## 2015-07-08 MED ORDER — SURGIFOAM 100 EX MISC
CUTANEOUS | Status: DC | PRN
  Administered 2015-07-08: 08:00:00 via TOPICAL

## 2015-07-08 MED ORDER — MENTHOL 3 MG MT LOZG: 1.0000 | LOZENGE | OROMUCOSAL | Status: DC | PRN

## 2015-07-08 MED ORDER — TRAMADOL HCL 50 MG PO TABS
100.0000 mg | ORAL_TABLET | Freq: Two times a day (BID) | ORAL | Status: DC | PRN
  Administered 2015-07-14 – 2015-07-17 (×2): 100 mg via ORAL
  Filled 2015-07-08 (×3): qty 2

## 2015-07-08 MED ORDER — DIPHENHYDRAMINE HCL 50 MG/ML IJ SOLN
INTRAMUSCULAR | Status: DC | PRN
  Administered 2015-07-08: 12.5 mg via INTRAVENOUS

## 2015-07-08 SURGICAL SUPPLY — 70 items
3.5 MM DRILL BIT ×3 IMPLANT
BAG DECANTER FOR FLEXI CONT (MISCELLANEOUS) ×3 IMPLANT
BENZOIN TINCTURE PRP APPL 2/3 (GAUZE/BANDAGES/DRESSINGS) ×3 IMPLANT
BIT DRILL 3.5 SHORT (BIT) ×3 IMPLANT
BLADE CLIPPER SURG (BLADE) ×3 IMPLANT
BLADE SURG 11 STRL SS (BLADE) ×3 IMPLANT
BUR MATCHSTICK NEURO 3.0 LAGG (BURR) ×3 IMPLANT
CANISTER SUCT 3000ML PPV (MISCELLANEOUS) ×3 IMPLANT
CAP LOCKING (Cap) ×20 IMPLANT
CONNECTOR LATERAL LONG (Connector) ×6 IMPLANT
DECANTER SPIKE VIAL GLASS SM (MISCELLANEOUS) ×3 IMPLANT
DRAPE C-ARM 42X72 X-RAY (DRAPES) IMPLANT
DRAPE LAPAROTOMY 100X72 PEDS (DRAPES) ×3 IMPLANT
DRAPE MICROSCOPE LEICA (MISCELLANEOUS) IMPLANT
DRAPE POUCH INSTRU U-SHP 10X18 (DRAPES) ×3 IMPLANT
DRAPE SURG 17X23 STRL (DRAPES) ×12 IMPLANT
DRSG OPSITE POSTOP 4X8 (GAUZE/BANDAGES/DRESSINGS) ×3 IMPLANT
DURAPREP 26ML APPLICATOR (WOUND CARE) ×3 IMPLANT
ELECT REM PT RETURN 9FT ADLT (ELECTROSURGICAL) ×3
ELECTRODE REM PT RTRN 9FT ADLT (ELECTROSURGICAL) ×2 IMPLANT
EVACUATOR 1/8 PVC DRAIN (DRAIN) ×3 IMPLANT
GAUZE SPONGE 4X4 12PLY STRL (GAUZE/BANDAGES/DRESSINGS) ×3 IMPLANT
GAUZE SPONGE 4X4 16PLY XRAY LF (GAUZE/BANDAGES/DRESSINGS) IMPLANT
GLOVE BIO SURGEON STRL SZ8 (GLOVE) ×3 IMPLANT
GLOVE ECLIPSE 7.5 STRL STRAW (GLOVE) ×3 IMPLANT
GLOVE ECLIPSE 9.0 STRL (GLOVE) ×3 IMPLANT
GLOVE EXAM NITRILE LRG STRL (GLOVE) IMPLANT
GLOVE EXAM NITRILE MD LF STRL (GLOVE) IMPLANT
GLOVE EXAM NITRILE XL STR (GLOVE) IMPLANT
GLOVE EXAM NITRILE XS STR PU (GLOVE) IMPLANT
GLOVE INDICATOR 7.5 STRL GRN (GLOVE) ×3 IMPLANT
GLOVE INDICATOR 8.0 STRL GRN (GLOVE) ×3 IMPLANT
GLOVE INDICATOR 8.5 STRL (GLOVE) ×3 IMPLANT
GOWN STRL REUS W/ TWL LRG LVL3 (GOWN DISPOSABLE) IMPLANT
GOWN STRL REUS W/ TWL XL LVL3 (GOWN DISPOSABLE) ×2 IMPLANT
GOWN STRL REUS W/TWL 2XL LVL3 (GOWN DISPOSABLE) IMPLANT
GOWN STRL REUS W/TWL LRG LVL3 (GOWN DISPOSABLE)
GOWN STRL REUS W/TWL XL LVL3 (GOWN DISPOSABLE) ×1
IMPL QUARTEX 3.5X12MM (Neuro Prosthesis/Implant) ×16 IMPLANT
IMPLANT QUARTEX 3.5X12MM (Neuro Prosthesis/Implant) ×24 IMPLANT
KIT BASIN OR (CUSTOM PROCEDURE TRAY) ×3 IMPLANT
KIT ROOM TURNOVER OR (KITS) ×3 IMPLANT
LIQUID BAND (GAUZE/BANDAGES/DRESSINGS) ×3 IMPLANT
LOCKING CAP (Cap) ×30 IMPLANT
MARKER SKIN DUAL TIP RULER LAB (MISCELLANEOUS) ×3 IMPLANT
MASTERGRAFT STRIP 10CM ×3 IMPLANT
MATRIX MASTERGRAFT STRIP 10CM ×2 IMPLANT
NEEDLE HYPO 25X1 1.5 SAFETY (NEEDLE) ×3 IMPLANT
NEEDLE SPNL 20GX3.5 QUINCKE YW (NEEDLE) ×3 IMPLANT
NS IRRIG 1000ML POUR BTL (IV SOLUTION) ×3 IMPLANT
PACK LAMINECTOMY NEURO (CUSTOM PROCEDURE TRAY) ×3 IMPLANT
PAD ARMBOARD 7.5X6 YLW CONV (MISCELLANEOUS) ×9 IMPLANT
PIN MAYFIELD SKULL DISP (PIN) ×3 IMPLANT
ROD SPINE POST 3.5X80 (Rod) ×6 IMPLANT
ROD SPINE STR 4.0X80 (Rod) IMPLANT
RUBBERBAND STERILE (MISCELLANEOUS) IMPLANT
SCREW 3.5X20MM (Screw) ×2 IMPLANT
SCREW BN 20X3.5XPA NS LF (Screw) ×4 IMPLANT
SPONGE LAP 4X18 X RAY DECT (DISPOSABLE) IMPLANT
SPONGE SURGIFOAM ABS GEL 100 (HEMOSTASIS) ×3 IMPLANT
STRIP CLOSURE SKIN 1/2X4 (GAUZE/BANDAGES/DRESSINGS) ×3 IMPLANT
SUT ETHILON 4 0 PS 2 18 (SUTURE) IMPLANT
SUT VIC AB 0 CT1 18XCR BRD8 (SUTURE) ×4 IMPLANT
SUT VIC AB 0 CT1 8-18 (SUTURE) ×2
SUT VIC AB 2-0 CT1 18 (SUTURE) ×3 IMPLANT
SUT VIC AB 4-0 PS2 27 (SUTURE) ×3 IMPLANT
TOWEL OR 17X24 6PK STRL BLUE (TOWEL DISPOSABLE) ×3 IMPLANT
TOWEL OR 17X26 10 PK STRL BLUE (TOWEL DISPOSABLE) ×3 IMPLANT
TRAY FOLEY W/METER SILVER 14FR (SET/KITS/TRAYS/PACK) ×3 IMPLANT
WATER STERILE IRR 1000ML POUR (IV SOLUTION) ×3 IMPLANT

## 2015-07-08 NOTE — Progress Notes (Signed)
Lunch relief by MA Jianna Drabik RN 

## 2015-07-08 NOTE — Anesthesia Procedure Notes (Signed)
Procedure Name: Intubation Date/Time: 07/08/2015 7:38 AM Performed by: Glo HerringLEE, Zayla Agar B Pre-anesthesia Checklist: Patient identified, Patient being monitored, Timeout performed, Emergency Drugs available and Suction available Patient Re-evaluated:Patient Re-evaluated prior to inductionOxygen Delivery Method: Circle system utilized Preoxygenation: Pre-oxygenation with 100% oxygen Intubation Type: IV induction Ventilation: Mask ventilation without difficulty Grade View: Grade I Tube type: Oral Tube size: 7.0 mm Number of attempts: 1 Airway Equipment and Method: Video-laryngoscopy and Stylet Placement Confirmation: CO2 detector,  positive ETCO2,  breath sounds checked- equal and bilateral and ETT inserted through vocal cords under direct vision Secured at: 21 cm Tube secured with: Tape Dental Injury: Teeth and Oropharynx as per pre-operative assessment  Comments: DLX1 with glidescope.  Pt in c collar.  Head and neck neutral.  Collar in placed during intubation.  No movement of neck.  Atraumatic intubation with 7.0 ETT.

## 2015-07-08 NOTE — Progress Notes (Signed)
Pt arrived to 5C16@ 1323, Pt A&Ox 4, c/o pain 0/10. Dressing CDI, hemavac present and draining Pt VS taken, pt on O2 from PACU, Fluids runningat 75 cc/hr. Foley intact, unclamped. Pt without distress. Family at the bedside. Diet ordered, will monitor.

## 2015-07-08 NOTE — H&P (Signed)
Jo LaughterLinda Owens is an 59 y.o. female.   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 Patient presents undergone a C4 and C5 corpectomy and did very well now presents for the second stage of a posterior cervical augmentation with lateral mass screws from C3-C7 laminar screws at C2. I've extensively gone over the risks and benefits of the operation with the patient as well as perioperative course expectations of outcome and alternatives surgery and she understands and agrees to proceed forward.  Past Medical History  Diagnosis Date  . Mood disorder (HCC)   . Arthritis   . CHF (congestive heart failure) (HCC)     takes Lasix daily  . COPD (chronic obstructive pulmonary disease) (HCC)   . Emphysema lung (HCC)   . SOB (shortness of breath)     with exertion  . Asthma     Breo and Combivent daily  . Multiple allergies     Fluticasone and Mucinex daily  . Pneumonia under the age of 57    hx of  . History of bronchitis 5-6wks ago  . Headache     daily   . Vertigo     takes Meclizine daily as needed  . Weakness     numbness and tingling in right arm and leg  . Joint pain   . Joint swelling   . GERD (gastroesophageal reflux disease)     takes Omeprazole daily  . History of blood transfusion 1983    no abnormal reaction noted  . Glaucoma     mild"  . Depression     takes Wellbutrin and Citalopram daily    Past Surgical History  Procedure Laterality Date  . Rotator cuff repair      right  . Back surgery      lumbar  . Tonsillectomy and adenoidectomy    . Tubal ligation    . Anterior cervical corpectomy N/A 06/22/2015    Procedure: ANTERIOR CERVICAL DECOMPRESSION/DISCECTOMY FUSION C6 - C7 with corpectomy;  Surgeon: Donalee CitrinGary Melda Mermelstein, MD;   Location: [facility];  Service: Neurosurgery;  Laterality: N/A;    Family History  Problem Relation Age of Onset  . CAD Father 3833  . COPD Mother   . Heart disease Mother     Valve disease  . CAD Brother 50    Mild CAD  . Emphysema Paternal Grandfather   . Allergies Mother   . Asthma Mother    Social History:  reports that she has quit smoking. Her smoking use included Cigarettes. She smoked 0.00 packs per day for 0 years. She has never used smokeless tobacco. She reports that she does not drink alcohol or use illicit drugs.  Allergies: No Known Allergies  Medications Prior to Admission  Medication Sig Dispense Refill  . buPROPion (WELLBUTRIN SR) 100 MG 12 hr tablet Take 100 mg by mouth 2 (two) times daily.     . Cholecalciferol (HM VITAMIN D3) 4000 UNITS CAPS Take 4,000 Units by mouth daily.     . citalopram (CELEXA) 40 MG tablet Take 40 mg by mouth daily.    . COMBIVENT RESPIMAT 20-100 MCG/ACT AERS respimat Inhale 2 puffs into the lungs 2 (two) times daily.     . fluticasone (FLONASE) 50 MCG/ACT nasal spray Place 1 spray into both nostrils  daily.    . Fluticasone Furoate-Vilanterol (BREO ELLIPTA) 200-25 MCG/INH AEPB Inhale 1 puff into the lungs daily.    . furosemide (LASIX) 20 MG tablet Take 1 tablet (20 mg total) by mouth daily. 90 tablet 3  . guaiFENesin (MUCINEX) 600 MG 12 hr tablet Take 600 mg by mouth 2 (two) times daily.    . indomethacin (INDOCIN SR) 75 MG CR capsule Take 75 mg by mouth 2 (two) times daily.    . methocarbamol (ROBAXIN) 750 MG tablet Take 0.5 tablets (375 mg total) by mouth 4 (four) times daily. (Patient taking differently: Take 750 mg by mouth 4 (four) times daily. ) 60 tablet 1  . omeprazole (PRILOSEC) 20 MG capsule Take 1 capsule by mouth daily.    Marland Kitchen oxyCODONE-acetaminophen (PERCOCET/ROXICET) 5-325 MG tablet Take 1-2 tablets by mouth every 4 (four) hours as needed for moderate pain. 60 tablet 0  . PREMPRO 0.3-1.5 MG per tablet Take 1 tablet by mouth  daily.    . traMADol (ULTRAM) 50 MG tablet Take 2 tablets by mouth 2 (two) times daily as needed for moderate pain.       Results for orders placed or performed during the hospital encounter of 07/08/15 (from the past 48 hour(s))  CBC     Status: None   Collection Time: 07/08/15  6:39 AM  Result Value Ref Range   WBC 9.9 4.0 - 10.5 K/uL   RBC 4.36 3.87 - 5.11 MIL/uL   Hemoglobin 13.5 12.0 - 15.0 g/dL   HCT 16.1 09.6 - 04.5 %   MCV 93.8 78.0 - 100.0 fL   MCH 31.0 26.0 - 34.0 pg   MCHC 33.0 30.0 - 36.0 g/dL   RDW 40.9 81.1 - 91.4 %   Platelets 292 150 - 400 K/uL   No results found.  Review of Systems  Constitutional: Negative.   Eyes: Negative.   Respiratory: Negative.   Cardiovascular: Negative.   Gastrointestinal: Negative.   Genitourinary: Negative.   Musculoskeletal: Positive for myalgias and neck pain.  Skin: Negative.   Neurological: Positive for tingling and sensory change.    Blood pressure 123/87, pulse 95, temperature 98.1 F (36.7 C), temperature source Oral, resp. rate 20, height 5' 2.5" (1.588 m), weight 71.668 kg (158 lb), SpO2 95 %. Physical Exam  Constitutional: She is oriented to person, place, and time. She appears well-developed and well-nourished.  HENT:  Head: Normocephalic.  Eyes: Pupils are equal, round, and reactive to light.  Neck: Normal range of motion.  Respiratory: Effort normal.  GI: Soft.  Neurological: She is alert and oriented to person, place, and time. She has normal strength. GCS eye subscore is 4. GCS verbal subscore is 5. GCS motor subscore is 6.  Strength is 5 out of 5 in her deltoid, bicep, tricep, wrist flexion, wrist extension, hand intrinsics are slightly weak at 4+ out of 5 bilaterally.  Skin: Skin is warm and dry.     Assessment/Plan 59 year old female presents for posterior cervical fusion from C2 to the C7 with laminar screws and lateral mass screws.  [name] P 07/08/2015, 7:06 AM

## 2015-07-08 NOTE — Care Management Note (Signed)
Case Management Note  Patient Details  Name: Merlene LaughterLinda Betley MRN: [id number] Date of Birth: Nov 27, 1955  Subjective/Objective:                    Action/Plan: Patient was recently discharged. Has been readmitted for stage 2 of surgery.  She lives at home alone, but has family that is able to provide assistance.  Patient was previously discharged home on oxygen and with a rolling walker and 3n1 [facility]  CM will continue to follow for discharge needs pending PT/OT evals and physician orders. Expected Discharge Date:                  Expected Discharge Plan:  Home/Self Care  In-House Referral:     Discharge planning Services  CM Consult  Post Acute Care Choice:    Choice offered to:     DME Arranged:    DME Agency:     HH Arranged:    HH Agency:     Status of Service:  In process, will continue to follow  Medicare Important Message Given:    Date Medicare IM Given:    Medicare IM give by:    Date Additional Medicare IM Given:    Additional Medicare Important Message give by:     If discussed at Long Length of Stay Meetings, dates discussed:    Additional Comments:  Anda KraftRobarge, Aliesha Dolata C, RN 07/08/2015, 2:41 PM

## 2015-07-08 NOTE — Op Note (Signed)
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 Posterior cervical fusion from C2-C7 with lateral mass screws at C4, C5, C6, C7 and lamina screws at C2 posterior lateral arthrodesis C2-C7 using master graft  Surgeon: Jillyn HiddenGary Rodriguez Aguinaldo  Asst.: Sherilyn CooterHenry pool  Anesthesia: Gen.  EBL: Middle  History of present illness: Patient is a very pleasant 59 year old female is a progress worsening pain numbness tingling weakness in her arms and hands workup revealed severe cord compression with the C4-C5 vertebral body and C6-7 disc space causing severe spinal cord compression. Due to patient's progressive conical syndrome imaging findings and failure conservative treatment I recommended anterior cervical corpectomies and fusion as well as posterior cervical augmentation. 2 weeks prior to this date she underwent the cervical corpectomies she presents now for subsequent posterior cervical augmentation. I extensively reviewed the risks and benefits of the procedure with the patient as well as perioperative course expectations of outcome and alternatives of surgery she understands and agrees to proceed forward.  Operative procedure: Patient brought into the or was induced under general anesthesia positioned prone in pins back side of her head was shaved in the backs of her neck was prepped and draped in routine sterile fashion after infiltration of 10 mL lidocaine with epi a midline incision was made and Bovie electrocautery was used to take down the subcutaneous tissue and subperiosteal dissection was carried out from C2 down the C7 exposing lateral masses at all those levels. Pilot holes were drilled first with lateral masses from the inferomedial quadrant at C4, C5, C6, and  C7. The C3 lateral mass was just dystrophic and already partially fused to the 4 facet. So these were then drilled to 12 mm tapped and screws were placed bilaterally in the lateral masses with trajectory extending the superolateral quadrant at C4, C5, C6, and C7. After all the screws placed testing the laminar placement the entry points were selected slightly superior on the left side slightly inferior on the right both laminar's were pilot holes were drilled holes were drilled initially to 14 probed and extended 18. And 3.5 x 18 mm screws were inserted into each cervical lamina. After all the screws were in place and postop fluoroscopy confirmed good position of the implants, then worked on assembling the construct using long connectors from the laminar heads down to the align with the rod placement I assembled the rod connector construct from C2 down to C7. All nuts were then anchored in place and locked in place. Then I aggressively decorticated the lamina and lateral masses from C2 down to C7 packed master graft in the facet joints and along the lateral masses. Then placed a Hemovac drain speckled vancomycin powder and the wound closed in layers with interrupted Vicryl and a running 4 subcuticular in the skin and Dermabond benzo and Steri-Strips were applied the wound was dressed patient recovered in stable condition. At the end the case all needle counts sponge counts were correct.

## 2015-07-08 NOTE — Anesthesia Preprocedure Evaluation (Addendum)
Anesthesia Evaluation  Patient identified by MRN, date of birth, ID band Patient awake    Reviewed: Allergy & Precautions, NPO status , Patient's Chart, lab work & pertinent test results  Airway Mallampati: II  TM Distance: <3 FB Neck ROM: Limited    Dental  (+) Edentulous Upper, Edentulous Lower   Pulmonary shortness of breath, asthma , COPD, former smoker,    Pulmonary exam normal        Cardiovascular +CHF  Normal cardiovascular exam  01/2015 Low risk myoview. EF 55%   Neuro/Psych PSYCHIATRIC DISORDERS Depression    GI/Hepatic Neg liver ROS, GERD  Medicated and Controlled,  Endo/Other  negative endocrine ROS  Renal/GU CRFRenal disease     Musculoskeletal  (+) Arthritis ,   Abdominal   Peds  Hematology negative hematology ROS (+)   Anesthesia Other Findings   Reproductive/Obstetrics                            Anesthesia Physical  Anesthesia Plan  ASA: III  Anesthesia Plan: General   Post-op Pain Management:    Induction: Intravenous  Airway Management Planned: Oral ETT  Additional Equipment:   Intra-op Plan:   Post-operative Plan: Extubation in OR  Informed Consent: I have reviewed the patients History and Physical, chart, labs and discussed the procedure including the risks, benefits and alternatives for the proposed anesthesia with the patient or authorized representative who has indicated his/her understanding and acceptance.   Dental advisory given  Plan Discussed with: CRNA, Anesthesiologist and Surgeon  Anesthesia Plan Comments:        Anesthesia Quick Evaluation

## 2015-07-08 NOTE — Transfer of Care (Signed)
Immediate Anesthesia Transfer of Care Note  Patient: Jo LaughterLinda Vanorder  Procedure(s) Performed: Procedure(s): POSTERIOR CERVICAL FUSION/FORAMINOTOMY CERVICAL TWO TO CERVICAL SEVEN  Patient Location: PACU  Anesthesia Type:General  Level of Consciousness: awake, alert  and oriented  Airway & Oxygen Therapy: Patient Spontanous Breathing and Patient connected to nasal cannula oxygen  Post-op Assessment: Report given to RN, Post -op Vital signs reviewed and stable and Patient moving all extremities X 4  Post vital signs: Reviewed and stable  Last Vitals:  Filed Vitals:   07/08/15 0641 07/08/15 1030  BP: 123/87 115/69  Pulse: 95 95  Temp: 36.7 C   Resp: 20 15    Complications: No apparent anesthesia complications

## 2015-07-08 NOTE — Anesthesia Postprocedure Evaluation (Signed)
Anesthesia Post Note  Patient: Jo LaughterLinda Owens  Procedure(s) Performed: Procedure(s): POSTERIOR CERVICAL FUSION/FORAMINOTOMY CERVICAL TWO TO CERVICAL SEVEN  Patient location during evaluation: PACU Anesthesia Type: General Level of consciousness: sedated Pain management: pain level controlled Vital Signs Assessment: post-procedure vital signs reviewed and stable Respiratory status: spontaneous breathing and respiratory function stable Cardiovascular status: stable Anesthetic complications: no    Last Vitals:  Filed Vitals:   07/08/15 1215 07/08/15 1222  BP: 132/70   Pulse: 124 125  Temp:    Resp: 14 15    Last Pain:  Filed Vitals:   07/08/15 1222  PainSc: 10-Worst pain ever                 Kourtney Terriquez DANIEL

## 2015-07-08 NOTE — Evaluation (Signed)
Physical Therapy Evaluation Patient Details Name: Jo LaughterLinda Owens MRN: [id number] DOB: 12/31/1955 Today's Date: 07/08/2015   History of Present Illness  59 y.o. female s/p second stage of a two-stage procedure which was posterior cervical cervical fusion for augmentation following anterior cervical corpectomy and discectomy. Posterior cervical fusion from C2-C7.        Clinical Impression  Patient is s/p above surgery presenting with functional limitations due to the deficits listed below (see PT Problem List). Very pleasant individual. Required min assist for bed mobility and close guard for safety with transfer post op day #0. Demonstrates and reports history of RLE weakness. No buckling noted with functional assessment however balance is impaired. HR did elevated to 132 with minimal activity and pt reports muscles feeling quite weak this evening. Will defer gait training until tomorrow. Anticipate she will progress well and is very motivated. Patient will benefit from skilled PT to increase their independence and safety with mobility to allow discharge to the venue listed below.       Follow Up Recommendations No PT follow up (if patient progress as well as anticipated)    Equipment Recommendations  None recommended by PT    Recommendations for Other Services       Precautions / Restrictions Precautions Precautions: Cervical;Fall Precaution Comments: monitor O2 Required Braces or Orthoses: Cervical Brace Cervical Brace: Hard collar Restrictions Weight Bearing Restrictions: No      Mobility  Bed Mobility Overal bed mobility: Needs Assistance Bed Mobility: Rolling;Sidelying to Sit;Sit to Sidelying Rolling: Supervision Sidelying to sit: Min assist     Sit to sidelying: Min guard General bed mobility comments: Supervision for safety with rolling and sit to supine transition. Reviewed log roll technique which pt was able to perform and recall from previous surgery. Min assist for  truncal support when rising to EOB.  Transfers Overall transfer level: Needs assistance Equipment used: Rolling walker (2 wheeled) Transfers: Sit to/from UGI CorporationStand;Stand Pivot Transfers Sit to Stand: Min guard Stand pivot transfers: Min guard       General transfer comment: Close guard for safety. VC for hand placement to rise and sit. Cues for walker placement with small pivotal steps to left and right. No buckling noted however pt reliant on RW for support with movement. HR did elevate to 132 with minimal activity.  Ambulation/Gait                Stairs            Wheelchair Mobility    Modified Rankin (Stroke Patients Only)       Balance Overall balance assessment: Needs assistance Sitting-balance support: No upper extremity supported;Feet supported Sitting balance-Leahy Scale: Good     Standing balance support: No upper extremity supported Standing balance-Leahy Scale: Fair Standing balance comment: Stood several minutes without UE use however became fatigued and sway increased, requiring UE use on RW eventually.                             Pertinent Vitals/Pain Pain Assessment: 0-10 Pain Score: 6  Pain Location: neck Pain Descriptors / Indicators: Aching Pain Intervention(s): Monitored during session;Repositioned;Limited activity within patient's tolerance    Home Living Family/patient expects to be discharged to:: Private residence Living Arrangements: Alone Available Help at Discharge: Family;Available PRN/intermittently Type of Home: Apartment Home Access: Stairs to enter Entrance Stairs-Rails: Right Entrance Stairs-Number of Steps: 16 Home Layout: One level Home Equipment: Shower seat;Cane - quad;Walker - 2  wheels;Other (comment) (3 in 1)      Prior Function Level of Independence: Independent with assistive device(s)         Comments: Has been using quad cane since previous surgery     Hand Dominance   Dominant Hand:  Right    Extremity/Trunk Assessment   Upper Extremity Assessment: Defer to OT evaluation           Lower Extremity Assessment: RLE deficits/detail RLE Deficits / Details: ankle dorsiflexion 4/5, knee extension 4/5, hallux extension 4+/5       Communication   Communication: No difficulties  Cognition Arousal/Alertness: Awake/alert Behavior During Therapy: WFL for tasks assessed/performed Overall Cognitive Status: Within Functional Limits for tasks assessed                      General Comments General comments (skin integrity, edema, etc.): HR elevated to 132 upon standing. SpO2 95% on 2L supplemental O2. She does describe symptoms suggestive of BPPV with onset of vertigo during rolling and with sit to sidelying. States the world is spinning and has had this occur in the past. These symptoms have returned over the past week. Educated about possibly ruling out BPPV in the future with horizontal roll and Dix-hallpike when cleared by MD following cervical surgery. May require tilt table to assess if symptoms worsen and pt is unstable due to vertigo symptoms.    Exercises General Exercises - Lower Extremity Ankle Circles/Pumps: AROM;Both;10 reps;Supine      Assessment/Plan    PT Assessment Patient needs continued PT services  PT Diagnosis Difficulty walking;Generalized weakness;Acute pain   PT Problem List Decreased strength;Decreased activity tolerance;Decreased balance;Decreased mobility;Decreased range of motion;Decreased knowledge of use of DME;Decreased knowledge of precautions;Cardiopulmonary status limiting activity;Pain  PT Treatment Interventions DME instruction;Gait training;Stair training;Functional mobility training;Therapeutic activities;Therapeutic exercise;Patient/family education;Balance training;Neuromuscular re-education;Modalities   PT Goals (Current goals can be found in the Care Plan section) Acute Rehab PT Goals Patient Stated Goal: No pain PT Goal  Formulation: With patient Time For Goal Achievement: 07/22/15 Potential to Achieve Goals: Good    Frequency Min 5X/week   Barriers to discharge Decreased caregiver support lives alone    Co-evaluation               End of Session Equipment Utilized During Treatment: Cervical collar Activity Tolerance: Patient tolerated treatment well Patient left: in bed;with call bell/phone within reach;with family/visitor present;with SCD's reapplied;with bed alarm set Nurse Communication: Mobility status;Other (comment) (HR and SpO2)         Time: 1914-7829 PT Time Calculation (min) (ACUTE ONLY): 32 min   Charges:   PT Evaluation $Initial PT Evaluation Tier I: 1 Procedure PT Treatments $Therapeutic Activity: 8-22 mins   PT G Codes:        Berton Mount 07/08/2015, 5:14 PM  [place],  [phone]

## 2015-07-09 MED ORDER — BACID PO TABS
2.0000 | ORAL_TABLET | Freq: Three times a day (TID) | ORAL | Status: DC
  Administered 2015-07-09 – 2015-07-18 (×27): 2 via ORAL
  Filled 2015-07-09 (×31): qty 2

## 2015-07-09 MED ORDER — DOCUSATE SODIUM 100 MG PO CAPS
100.0000 mg | ORAL_CAPSULE | Freq: Two times a day (BID) | ORAL | Status: DC
  Administered 2015-07-09 – 2015-07-18 (×18): 100 mg via ORAL
  Filled 2015-07-09 (×18): qty 1

## 2015-07-09 MED ORDER — DIAZEPAM 5 MG PO TABS
5.0000 mg | ORAL_TABLET | Freq: Four times a day (QID) | ORAL | Status: DC | PRN
  Administered 2015-07-09 – 2015-07-11 (×7): 10 mg via ORAL
  Administered 2015-07-12: 5 mg via ORAL
  Administered 2015-07-12: 10 mg via ORAL
  Administered 2015-07-14: 5 mg via ORAL
  Filled 2015-07-09: qty 1
  Filled 2015-07-09 (×9): qty 2

## 2015-07-09 MED ORDER — POLYETHYLENE GLYCOL 3350 17 G PO PACK
17.0000 g | PACK | Freq: Every day | ORAL | Status: DC
  Administered 2015-07-09 – 2015-07-18 (×8): 17 g via ORAL
  Filled 2015-07-09 (×9): qty 1

## 2015-07-09 NOTE — Clinical Social Work Note (Signed)
CSW consult acknowledged:  Clinical Social Worker received consult for SNF placement. PT currently recommending "no PT follow up".   Clinical Social Worker will sign off for now as social work intervention is no longer needed. Please consult us again if new need arises.  Andros Channing, MSW, LCSWA [phone] 07/09/2015 9:19 AM  

## 2015-07-09 NOTE — Progress Notes (Signed)
Occupational Therapy Evaluation Patient Details Name: [name] MRN: [id number] DOB: Nov 10, 1955 Today's Date: 07/09/2015    History of Present Illness 59 y.o. female s/p second stage of a two-stage procedure which was posterior cervical cervical fusion for augmentation following anterior cervical corpectomy and discectomy. Posterior cervical fusion from C2-C7.   Clinical Impression   Pt admitted with the above diagnoses and presents with below problem list. Pt will benefit from continued acute OT to address the below listed deficits and maximize independence with BADLs prior to d/c to venue below. PTA pt was mod I with ADLs. Pt is currently setup to min A with ADLs, mod A for tub transfer. OT to continue to follow acutely.      Follow Up Recommendations  Home health OT;Supervision - Intermittent;Other (comment) (OOb/mobility)    Equipment Recommendations  None recommended by OT    Recommendations for Other Services       Precautions / Restrictions Precautions Precautions: Cervical;Fall Precaution Comments: monitor O2 and heart rate with mobility Required Braces or Orthoses: Cervical Brace Cervical Brace: Hard collar Restrictions Weight Bearing Restrictions: No      Mobility Bed Mobility Overal bed mobility: Needs Assistance Bed Mobility: Rolling;Sidelying to Sit;Sit to Sidelying Rolling: Supervision Sidelying to sit: Min guard     Sit to sidelying: Min guard General bed mobility comments: bed flat. supervision to min guard for safety with pt reporting increased pain.   Transfers Overall transfer level: Needs assistance Equipment used: Rolling walker (2 wheeled) Transfers: Sit to/from [employer] Sit to Stand: Min guard Stand pivot transfers: Min guard       General transfer comment: Min guard for safety. Pt tremulous and reporting increase pain. Moves slowly and cautiously.     Balance Overall balance assessment: Needs  assistance Sitting-balance support: No upper extremity supported;Feet supported Sitting balance-Leahy Scale: Good     Standing balance support: Bilateral upper extremity supported;During functional activity Standing balance-Leahy Scale: Fair                              ADL Overall ADL's : Needs assistance/impaired Eating/Feeding: Set up;Sitting   Grooming: Set up;Sitting   Upper Body Bathing: Sitting;Minimal assitance;Set up   Lower Body Bathing: Min guard;Sit to/from stand   Upper Body Dressing : Minimal assistance;Set up;Sitting Upper Body Dressing Details (indicate cue type and reason): to donn Aspen collar Lower Body Dressing: Min guard;Sit to/from stand;With adaptive equipment   Toilet Transfer: Min guard;Stand-pivot;BSC;RW   Toileting- Clothing Manipulation and Hygiene: Set up;Sitting/lateral lean   Tub/ Shower Transfer: Tub transfer;Ambulation;3 in 1;Rolling walker;Moderate assistance   Functional mobility during ADLs: Min guard;Rolling walker General ADL Comments: Pt took pivotal steps from EOB to [facility]. Pt tremulous in standing position, reports increased pain. Reviewed ADL technique.      Vision     Perception     Praxis      Pertinent Vitals/Pain Pain Assessment: 0-10 Pain Score: 7  Pain Location: neck Pain Descriptors / Indicators: Aching;Sore Pain Intervention(s): Limited activity within patient's tolerance;Monitored during session;Repositioned;Utilized relaxation techniques     Hand Dominance Right   Extremity/Trunk Assessment Upper Extremity Assessment Upper Extremity Assessment: Generalized weakness   Lower Extremity Assessment Lower Extremity Assessment: Defer to PT evaluation       Communication Communication Communication: No difficulties   Cognition Arousal/Alertness: Awake/alert Behavior During Therapy: WFL for tasks assessed/performed Overall Cognitive Status: Within Functional Limits for tasks assessed  General Comments    [facility] and [facility]. Heart rate around 103 bpm. SaO2 did drop to 87-89 at end of session after transfering BSC to EOB. With breathing technique cues quickly recovered to above 90. Pt on 2L O2 via [place].     Exercises       Shoulder Instructions      Home Living Family/patient expects to be discharged to:: Private residence Living Arrangements: Alone Available Help at Discharge: Family;Available PRN/intermittently Type of Home: Apartment Home Access: Stairs to enter Entrance Stairs-Number of Steps: 16 Entrance Stairs-Rails: Right Home Layout: One level     Bathroom Shower/Tub: Tub/shower unit Shower/tub characteristics: Engineer, building servicesCurtain Bathroom Toilet: Standard Bathroom Accessibility: Yes How Accessible: Accessible via walker Home Equipment: Shower seat;Cane - quad;Walker - 2 wheels;Other (comment)          Prior Functioning/Environment Level of Independence: Independent with assistive device(s)        Comments: Has been using quad cane since previous surgery    OT Diagnosis: Generalized weakness;Acute pain   OT Problem List: Decreased strength;Decreased range of motion;Decreased activity tolerance;Decreased knowledge of use of DME or AE;Decreased knowledge of precautions;Cardiopulmonary status limiting activity;Impaired balance (sitting and/or standing);Pain   OT Treatment/Interventions: Self-care/ADL training;DME and/or AE instruction;Therapeutic activities;Patient/family education;Balance training    OT Goals(Current goals can be found in the care plan section) Acute Rehab OT Goals Patient Stated Goal: No pain OT Goal Formulation: With patient Time For Goal Achievement: 07/16/15 Potential to Achieve Goals: Good ADL Goals Pt Will Perform Grooming: with modified independence;standing Pt Will Perform Upper Body Bathing: with modified independence;sitting Pt Will Perform Lower Body Bathing: with modified  independence;with adaptive equipment;sit to/from stand Pt Will Perform Upper Body Dressing: with modified independence;sitting Pt Will Perform Lower Body Dressing: with modified independence;with adaptive equipment;sit to/from stand Pt Will Transfer to Toilet: with modified independence;ambulating;bedside commode Pt Will Perform Toileting - Clothing Manipulation and hygiene: with modified independence;sitting/lateral leans;sit to/from stand Pt Will Perform Tub/Shower Transfer: Tub transfer;with min guard assist;ambulating;3 in 1;rolling walker  OT Frequency: Min 2X/week   Barriers to D/C:            Co-evaluation              End of Session Equipment Utilized During Treatment: Gait belt;Rolling walker;Cervical collar  Activity Tolerance: Patient limited by pain;Other (comment) (tremulous) Patient left: in bed;with call bell/phone within reach;with bed alarm set;with SCD's reapplied   Time: 4782-95621117-1151 OT Time Calculation (min): 34 min Charges:  OT General Charges $OT Visit: 1 Procedure OT Evaluation $Initial OT Evaluation Tier I: 1 Procedure OT Treatments $Self Care/Home Management : 8-22 mins G-Codes:    Pilar GrammesMathews, Jacqualin Shirkey H 07/09/2015, 12:10 PM

## 2015-07-09 NOTE — Progress Notes (Signed)
Physical Therapy Treatment Patient Details Name: Jo LaughterLinda Owens MRN: [id number] DOB: 1955-08-10 Today's Date: 07/09/2015    History of Present Illness 59 y.o. female s/p second stage of a two-stage procedure which was posterior cervical cervical fusion for augmentation following anterior cervical corpectomy and discectomy. Posterior cervical fusion from C2-C7.    PT Comments    Pt making steady progress toward goals. Continues to have elevated heart rate and decreased oxygen saturations with mobility. Acute PT to continue progressing pt toward goals.  Follow Up Recommendations  No PT follow up (if pt progresses as anticipated)     Equipment Recommendations  None recommended by PT    Precautions / Restrictions Precautions Precautions: Cervical;Fall Precaution Comments: monitor O2 and heart rate with mobility Required Braces or Orthoses: Cervical Brace Cervical Brace: Hard collar Restrictions Weight Bearing Restrictions: No    Mobility  Bed Mobility     Rolling: Supervision Sidelying to sit: Min guard       General bed mobility comments: bed flat and no rails used. cues on precautions and technique with rolling and sitting up to edge of bed. No dizziness with rolling or sitting up today.   Transfers Overall transfer level: Needs assistance Equipment used: Rolling walker (2 wheeled) Transfers: Sit to/from Stand Sit to Stand: Min guard Stand pivot transfers: Min guard       General transfer comment: cues for hand placement with sit<>stand transfers, cues on posture and walker use/proximity with stand<>pivot transfer bed>chair. HR elevated to 112 bpm with transfer, SaO2 decreased to 87%. Quick recovery of oxygen with cues to deep breath through nose (pt on 2 lpm oxygen via nasal canula). HR decreased to 103 bpm after ~5 minute seated rest break.      Cognition Arousal/Alertness: Awake/alert Behavior During Therapy: WFL for tasks assessed/performed Overall Cognitive  Status: Within Functional Limits for tasks assessed           Pertinent Vitals/Pain Pain Assessment: 0-10 Pain Score: 8  Pain Location: cervical area Pain Descriptors / Indicators: Aching;Sore Pain Intervention(s): Limited activity within patient's tolerance;Monitored during session;Premedicated before session;Repositioned     PT Goals (current goals can now be found in the care plan section) Acute Rehab PT Goals Patient Stated Goal: No pain PT Goal Formulation: With patient Time For Goal Achievement: 07/22/15 Potential to Achieve Goals: Good Progress towards PT goals: Progressing toward goals    Frequency  Min 5X/week    PT Plan Current plan remains appropriate    End of Session Equipment Utilized During Treatment: Gait belt;Cervical collar Activity Tolerance: Patient tolerated treatment well Patient left: in chair;with call bell/phone within reach;with chair alarm set     Time: 7829-56210927-0947 PT Time Calculation (min) (ACUTE ONLY): 20 min  Charges:  $Therapeutic Activity: 8-22 mins                     Sallyanne KusterBury, Kathy 07/09/2015, 9:57 AM   Sallyanne KusterKathy Bury, PTA, CLT Acute Rehab Services [phone] 07/09/2015, 9:59 AM

## 2015-07-09 NOTE — Progress Notes (Signed)
Postop day 1. Patient complains of neck spasms. Otherwise pain well controlled. Denies any radiating pain. No new numbness or weakness.  Afebrile. Vital signs are stable. Drain output low. Urine output good. Awake and alert. Oriented and appropriate. Motor and sensory function stable. Wound dressing clean and dry.  Progressing well following posterior cervical fusion. Continue efforts at mobilization. Question discharge tomorrow.

## 2015-07-10 MED ORDER — BUDESONIDE-FORMOTEROL FUMARATE 160-4.5 MCG/ACT IN AERO
2.0000 | INHALATION_SPRAY | Freq: Two times a day (BID) | RESPIRATORY_TRACT | Status: DC
  Administered 2015-07-10 – 2015-07-18 (×14): 2 via RESPIRATORY_TRACT
  Filled 2015-07-10: qty 6

## 2015-07-10 NOTE — Progress Notes (Signed)
Postop day 2. Overall patient still having quite a bit of neck pain and neck spasm. No radicular symptoms. Mobilizing slowly.  Awake and alert. Oriented and appropriate. Motor and sensory function intact. Wound clean and dry. Drain output left. Vitals are stable. She is afebrile.  Progressing well after posterior cervical fusion. Remove drain today. Continue efforts at mobilization. Continue hospitalization secondary to pain control and mobility issues.

## 2015-07-10 NOTE — Progress Notes (Signed)
Physical Therapy Treatment Patient Details Name: Jo LaughterLinda Falk MRN: [id number] DOB: 08-24-1955 Today's Date: 07/10/2015    History of Present Illness 59 y.o. female s/p second stage of a two-stage procedure which was posterior cervical cervical fusion for augmentation following anterior cervical corpectomy and discectomy. Posterior cervical fusion from C2-C7.    PT Comments    Pt continues to make steady progress toward goals with increased mobility/activity tolerance. Vitals improved with mobility today as well (see note below for details).  Follow Up Recommendations  No PT follow up (if pt progresses as anticipated)     Equipment Recommendations  None recommended by PT    Precautions / Restrictions Precautions Precautions: Cervical;Fall Precaution Comments: monitor O2 and heart rate with mobility Required Braces or Orthoses: Cervical Brace Cervical Brace: Hard collar    Mobility  Bed Mobility Overal bed mobility: Needs Assistance   Rolling: Supervision Sidelying to sit: Supervision       General bed mobility comments: bed flat. supervision to min guard for safety with pt reporting increased pain.   Transfers Overall transfer level: Needs assistance Equipment used: Rolling walker (2 wheeled)   Sit to Stand: Min guard Stand pivot transfers: Min guard       General transfer comment: Min guard for safety. no tremors noted with mobility today. stand<>pivot perform from bed to bsc.   Ambulation/Gait Ambulation/Gait assistance: Min guard Ambulation Distance (Feet): 30 Feet Assistive device: Rolling walker (2 wheeled) Gait Pattern/deviations: Step-through pattern;Decreased stride length;Narrow base of support;Decreased step length - right;Decreased step length - left Gait velocity: decreased Gait velocity interpretation: Below normal speed for age/gender General Gait Details: slow, guarded gait pattern        Cognition Arousal/Alertness: Awake/alert Behavior  During Therapy: WFL for tasks assessed/performed Overall Cognitive Status: Within Functional Limits for tasks assessed        General Comments SaO2 93% on RA and HR 98 bpm before mobility. SaO2 93%, HR 112 after transfer to bsc. SaO2 92% and HR 106 after gait at end of session.     Pertinent Vitals/Pain Pain Assessment: 0-10 Pain Score: 8  Pain Location: neckl Pain Descriptors / Indicators: Aching;Sore Pain Intervention(s): Limited activity within patient's tolerance;Monitored during session;Premedicated before session;Repositioned     PT Goals (current goals can now be found in the care plan section) Acute Rehab PT Goals Patient Stated Goal: No pain PT Goal Formulation: With patient Time For Goal Achievement: 07/22/15 Potential to Achieve Goals: Good Progress towards PT goals: Progressing toward goals    Frequency  Min 5X/week    PT Plan Current plan remains appropriate    End of Session Equipment Utilized During Treatment: Gait belt;Cervical collar Activity Tolerance: Patient tolerated treatment well Patient left: in chair;with call bell/phone within reach;with chair alarm set     Time: 9629-52841140-1204 PT Time Calculation (min) (ACUTE ONLY): 24 min  Charges:  $Gait Training: 8-22 mins $Therapeutic Activity: 8-22 mins                       Sallyanne KusterBury, Kathy 07/10/2015, 1:10 PM   Sallyanne KusterKathy Bury, PTA, CLT Acute Rehab Services [phone] 07/10/2015, 1:12 PM

## 2015-07-11 ENCOUNTER — Encounter (HOSPITAL_COMMUNITY): Payer: Self-pay | Admitting: Neurosurgery

## 2015-07-11 NOTE — Progress Notes (Addendum)
Physical Therapy Treatment Patient Details Name: Jo LaughterLinda Owens MRN: [id number] DOB: [birth year] Today's Date: 07/11/2015    History of Present Illness 59 y.o. female s/p second stage of a two-stage procedure which was posterior cervical cervical fusion for augmentation following anterior cervical corpectomy and discectomy. Posterior cervical fusion from C2-C7.    PT Comments    Patient with very slow progress with ambulation. She ambulated 25 ft on RA with SaO2 94-96% and HR 124 bpm and reported she felt "weak all over" and unable to continue. Turned and walked 20 ft back to room with SaO2 and HR the same with pt stating, "I don't think I can make it" however no visible signs of distress other than elevated HR.   Returned to supine on room air and SaO2 dropped to 87-88% and did not increase x 1 minute and 2L O2 resumed. HR 106 in supine.   Pt very anxious re: getting up 16 steps into her home (stating it took 3 people to assist her after previous surgery). ? Anxiety partially the cause for incr HR?   Follow Up Recommendations  No PT follow up     Equipment Recommendations  None recommended by PT    Recommendations for Other Services       Precautions / Restrictions Precautions Precautions: Cervical;Fall Required Braces or Orthoses: Cervical Brace Cervical Brace: Hard collar    Mobility  Bed Mobility Overal bed mobility: Modified Independent Bed Mobility: Rolling;Sit to Sidelying Rolling: Modified independent (Device/Increase time) Sidelying to sit: Modified independent (Device/Increase time)     Sit to sidelying: Modified independent (Device/Increase time) General bed mobility comments: bed flat, no rail, good technique  Transfers Overall transfer level: Modified independent   Transfers: Sit to/from Stand Sit to Stand: Modified independent (Device/Increase time)           Ambulation/Gait Ambulation/Gait assistance: Min guard Ambulation Distance (Feet): 45  Feet Assistive device: Rolling walker (2 wheeled) Gait Pattern/deviations: Step-through pattern;Decreased stride length;Shuffle   Gait velocity interpretation: Below normal speed for age/gender     Stairs Stairs:  (unable due to "feeling too weak")          Wheelchair Mobility    Modified Rankin (Stroke Patients Only)       Balance           Standing balance support: No upper extremity supported Standing balance-Leahy Scale: Fair                      Cognition Arousal/Alertness: Awake/alert Behavior During Therapy: WFL for tasks assessed/performed Overall Cognitive Status: Within Functional Limits for tasks assessed                      Exercises      General Comments        Pertinent Vitals/Pain Pain Assessment: 0-10 Pain Score: 7  Faces Pain Scale: Hurts even more Pain Location: neck, shoulders Pain Descriptors / Indicators: Constant;Aching;Operative site guarding Pain Intervention(s): Limited activity within patient's tolerance;Monitored during session;Premedicated before session;Repositioned    Home Living                      Prior Function            PT Goals (current goals can now be found in the care plan section) Acute Rehab PT Goals Patient Stated Goal: pt is concerned about getting up into her home--16 stairs Time For Goal Achievement: 07/22/15 Progress towards PT goals: Progressing toward  goals (very slowly)    Frequency  Min 5X/week    PT Plan Current plan remains appropriate    Co-evaluation             End of Session Equipment Utilized During Treatment: Gait belt;Oxygen Activity Tolerance: Treatment limited secondary to medical complications (Comment) ("weak all over") Patient left: in bed;with call bell/phone within reach;with bed alarm set     Time: 1610-9604 PT Time Calculation (min) (ACUTE ONLY): 23 min  Charges:  $Gait Training: 23-37 mins                    G Codes:       [name] 01-Aug-2015, 11:00 AM Pager [phone]

## 2015-07-11 NOTE — Progress Notes (Signed)
Subjective: Patient reports Doing well still withNeck pain but no radicular symptoms neck pain is improving   Objective: Vital signs in last 24 hours: Temp:  [98.1 F (36.7 C)-99 F (37.2 C)] 98.3 F (36.8 C) (12/12 1432) Pulse Rate:  [92-107] 107 (12/12 1432) Resp:  [16-18] 18 (12/12 1432) BP: (94-114)/(53-75) 94/63 mmHg (12/12 1432) SpO2:  [94 %-100 %] 94 % (12/12 1432)  Intake/Output from previous day: 12/11 0701 - 12/12 0700 In: -  Out: 750 [Urine:750] Intake/Output this shift:    Strength out of 5 wound clean dry and intact  Lab Results: No results for input(s): WBC, HGB, HCT, PLT in the last 72 hours. BMET No results for input(s): NA, K, CL, CO2, GLUCOSE, BUN, CREATININE, CALCIUM in the last 72 hours.  Studies/Results: No results found.  Assessment/Plan: Continue to mobilize with physical and occupational therapy probable discharge Tuesday or Wednesday  LOS: 3 days     [name] P 07/11/2015, 3:50 PM

## 2015-07-11 NOTE — Progress Notes (Signed)
Occupational Therapy Treatment Patient Details Name: [name] MRN: [id number] DOB: [birth year] Today's Date: 07/11/2015    History of present illness 59 y.o. female s/p second stage of a two-stage procedure which was posterior cervical cervical fusion for augmentation following anterior cervical corpectomy and discectomy. Posterior cervical fusion from C2-C7.   OT comments  Pt performing grooming, toileting and LB dressing at a supervision level.  Good adherence to cervical precautions with bed mobility.   Follow Up Recommendations  No OT follow up;Supervision - Intermittent    Equipment Recommendations  None recommended by OT    Recommendations for Other Services      Precautions / Restrictions Precautions Precautions: Cervical;Fall Required Braces or Orthoses: Cervical Brace Cervical Brace: Hard collar       Mobility Bed Mobility Overal bed mobility: Modified Independent Bed Mobility: Rolling;Sidelying to Sit Rolling: Modified independent (Device/Increase time) Sidelying to sit: Modified independent (Device/Increase time)       General bed mobility comments: bed flat, used rail, good technique  Transfers Overall transfer level: Needs assistance   Transfers: Sit to/from [employer] Transfers Sit to Stand: Supervision Stand pivot transfers: Supervision            Balance                                   ADL Overall ADL's : Needs assistance/impaired Eating/Feeding: Independent;Sitting Eating/Feeding Details (indicate cue type and reason): able to open packaging Grooming: Wash/dry hands;Supervision/safety;Brushing hair;Standing Grooming Details (indicate cue type and reason): uses L hand to reach back of head with comb         Upper Body Dressing : Set up;Sitting Upper Body Dressing Details (indicate cue type and reason): pt able to front opening gown, dressing R UE first due to weakness Lower Body Dressing: Sit to/from  stand;Supervision/safety;Sitting/lateral leans Lower Body Dressing Details (indicate cue type and reason): able to don socks indpendently Toilet Transfer: Supervision/safety;BSC;Stand-pivot   Toileting- Clothing Manipulation and Hygiene: Sit to/from stand;Sitting/lateral lean;Supervision/safety;Modified independent         General ADL Comments: Good adherence to cervical precautions.      Vision                     Perception     Praxis      Cognition   Behavior During Therapy: WFL for tasks assessed/performed Overall Cognitive Status: Within Functional Limits for tasks assessed                       Extremity/Trunk Assessment               Exercises     Shoulder Instructions       General Comments      Pertinent Vitals/ Pain       Pain Assessment: Faces Faces Pain Scale: Hurts even more Pain Location: neck, upper shoulders Pain Descriptors / Indicators: Burning;Sore Pain Intervention(s): Limited activity within patient's tolerance;Monitored during session;Premedicated before session;Repositioned  Home Living                                          Prior Functioning/Environment              Frequency Min 2X/week     Progress Toward Goals  OT Goals(current goals can now be  found in the care plan section)  Progress towards OT goals: Progressing toward goals  Acute Rehab OT Goals Patient Stated Goal: pt is concerned about getting up into her home--16 stairs  Plan Discharge plan remains appropriate    Co-evaluation                 End of Session Equipment Utilized During Treatment: Cervical collar;Gait belt   Activity Tolerance Patient tolerated treatment well   Patient Left in chair;with call bell/phone within reach;with chair alarm set   Nurse Communication          Time: 1610-96040944-1010 OT Time Calculation (min): 26 min  Charges: OT General Charges $OT Visit: 1 Procedure OT Treatments $Self  Care/Home Management : 23-37 mins  Evern BioMayberry, Breonna Gafford Lynn 07/11/2015, 10:24 AM  [phone]

## 2015-07-12 MED ORDER — FLUCONAZOLE 100 MG PO TABS
100.0000 mg | ORAL_TABLET | Freq: Every day | ORAL | Status: DC
  Administered 2015-07-12 – 2015-07-18 (×7): 100 mg via ORAL
  Filled 2015-07-12 (×7): qty 1

## 2015-07-12 MED ORDER — MAGIC MOUTHWASH
15.0000 mL | Freq: Four times a day (QID) | ORAL | Status: DC | PRN
  Administered 2015-07-12 (×2): 15 mL via ORAL
  Filled 2015-07-12 (×3): qty 15

## 2015-07-12 NOTE — Progress Notes (Signed)
Physical Therapy Treatment Patient Details Name: [name] MRN: [id number] DOB: 1955/11/05 Today's Date: 07/12/2015    History of Present Illness 59 y.o. female s/p second stage of a two-stage procedure which was posterior cervical cervical fusion for augmentation following anterior cervical corpectomy and discectomy. Posterior cervical fusion from C2-C7.    PT Comments    Patient continues to be limited in mobility due to pain and fatique. States she can only tolerate sitting up for a few minutes at a time. Encouraged more time upright today (walking or sitting) to maintain/gain postural muscle strength/endurance. Did manage to ascend 2 steps with min assist and rail. Has 16 steps to enter home.   Follow Up Recommendations  Home health PT     Equipment Recommendations  None recommended by PT    Recommendations for Other Services       Precautions / Restrictions Precautions Precautions: Cervical;Fall Required Braces or Orthoses: Cervical Brace Cervical Brace: Hard collar    Mobility  Bed Mobility Overal bed mobility: Needs Assistance Bed Mobility: Sidelying to Sit Rolling: Modified independent (Device/Increase time) Sidelying to sit: Min assist     Sit to sidelying: Modified independent (Device/Increase time) General bed mobility comments: bed flat, + rail, good technique but needed assist due to pain  Transfers Overall transfer level: Modified independent     Sit to Stand: Modified independent (Device/Increase time)         General transfer comment: x3; proper sequencing   Ambulation/Gait Ambulation/Gait assistance: Min guard;Supervision Ambulation Distance (Feet): 20 Feet (toileted, 10, seated rest, 5, 5) Assistive device: Rolling walker (2 wheeled) Gait Pattern/deviations: Step-through pattern;Decreased stride length;Shuffle Gait velocity: very slow Gait velocity interpretation: <1.8 ft/sec, indicative of risk for recurrent falls General Gait Details: pt  encouraged to walk and roll RW at same time "like a grocery cart" vs stop/start   Stairs Stairs: Yes Stairs assistance: Min assist Stair Management: One rail Right;Step to pattern;Forwards;Backwards (ascend forward; descended backwards to avoid turning ) Number of Stairs: 2 General stair comments: reported feeling too weak, required several seated rest breaks, however legs felt strong as she ascended 2 steps  Wheelchair Mobility    Modified Rankin (Stroke Patients Only)       Balance             Standing balance-Leahy Scale: Fair                      Cognition Arousal/Alertness: Awake/alert Behavior During Therapy: WFL for tasks assessed/performed Overall Cognitive Status: Within Functional Limits for tasks assessed                      Exercises      General Comments General comments (skin integrity, edema, etc.): Encouraged to spend more time upright sitting or walking as she will not build postural muscle strength lying down nearly all day. RN present and agreed      Pertinent Vitals/Pain Pain Assessment: 0-10 Pain Score: 6  Pain Location: neck Pain Descriptors / Indicators: Operative site guarding;Aching Pain Intervention(s): Limited activity within patient's tolerance;Monitored during session;Repositioned;Patient requesting pain meds-RN notified    Home Living                      Prior Function            PT Goals (current goals can now be found in the care plan section) Acute Rehab PT Goals Patient Stated Goal: pt is concerned about getting  up into her home--16 stairs Time For Goal Achievement: 07/22/15 Progress towards PT goals: Progressing toward goals (very slowly)    Frequency  Min 5X/week    PT Plan Discharge plan needs to be updated    Co-evaluation             End of Session Equipment Utilized During Treatment: Gait belt Activity Tolerance: Patient limited by fatigue Patient left: with call bell/phone  within reach;in chair;with nursing/sitter in room     Time: 1014-1055 PT Time Calculation (min) (ACUTE ONLY): 41 min  Charges:  $Gait Training: 38-52 mins                    G Codes:      Janaisha Tolsma 07/12/2015, 11:10 AM Pager [phone]

## 2015-07-12 NOTE — Progress Notes (Signed)
Subjective: Patient reports Worsening soreness of her throat and neck pain about the same no significant improvement overnight  Objective: Vital signs in last 24 hours: Temp:  [98.1 F (36.7 C)-98.9 F (37.2 C)] 98.6 F (37 C) (12/13 0546) Pulse Rate:  [92-107] 100 (12/13 0546) Resp:  [18-187] 18 (12/13 0546) BP: (91-112)/(60-70) 109/66 mmHg (12/13 0546) SpO2:  [94 %-95 %] 94 % (12/13 0546)  Intake/Output from previous day:   Intake/Output this shift:    awake alert strength 5 out of 5 wound clean dry and intact, oropharynx with white film and patches consistent with thrush  Lab Results: No results for input(s): WBC, HGB, HCT, PLT in the last 72 hours. BMET No results for input(s): NA, K, CL, CO2, GLUCOSE, BUN, CREATININE, CALCIUM in the last 72 hours.  Studies/Results: No results found.  Assessment/Plan: Progressive mobilization with physical and occupational therapy. Looks like patient has thrush. Will start Magic mouthwash and Diflucan.  LOS: 4 days     Renso Swett P 07/12/2015, 7:31 AM

## 2015-07-13 ENCOUNTER — Encounter (HOSPITAL_COMMUNITY): Payer: Self-pay | Admitting: Neurosurgery

## 2015-07-13 NOTE — Progress Notes (Signed)
Pt ambulated with rolling walker to the bathroom and to chair to sit up for breakfast. Brace on and aligned. Surgical dressings clean dry and intact. No noted distress. Safety measures in place. Call bell within reach. Will continue to monitor

## 2015-07-13 NOTE — Progress Notes (Signed)
Occupational Therapy Treatment Patient Details Name: [name] MRN: [id number] DOB: 1955-12-11 Today's Date: 07/13/2015    History of present illness 59 y.o. female s/p second stage of a two-stage procedure which was posterior cervical cervical fusion for augmentation following anterior cervical corpectomy and discectomy. Posterior cervical fusion from C2-C7.   OT comments  Pt was fatigued this session from previous therapy session but agreeable to OT intervention. Pt ambulated 15' to bathroom with RW and close supervision. Pt reported feeling very week after toileting and entered bed from closer side. Pt continues to benefit from OT intervention.   Follow Up Recommendations  No OT follow up;Supervision - Intermittent    Equipment Recommendations  None recommended by OT    Recommendations for Other Services      Precautions / Restrictions Precautions Precautions: Cervical;Fall Required Braces or Orthoses: Cervical Brace Cervical Brace: Hard collar Restrictions Weight Bearing Restrictions: No       Mobility Bed Mobility Overal bed mobility: Needs Assistance Bed Mobility: Sidelying to Sit;Rolling;Sit to Supine Rolling: Modified independent (Device/Increase time) Sidelying to sit: Min guard   Sit to supine: Min guard   General bed mobility comments: bed flat and bed rail removed. Pt utilizing good technique with increased time.  Transfers Overall transfer level: Needs assistance Equipment used: Rolling walker (2 wheeled) Transfers: Sit to/from [employer] Sit to Stand: Modified independent (Device/Increase time) Stand pivot transfers: Supervision       General transfer comment: supervision for safety    Balance Overall balance assessment: Needs assistance Sitting-balance support: Feet supported;No upper extremity supported Sitting balance-Leahy Scale: Good Sitting balance - Comments: brushing hair while seated on EOB   Standing balance support:  No upper extremity supported;During functional activity Standing balance-Leahy Scale: Fair Standing balance comment: use of RW                   ADL Overall ADL's : Needs assistance/impaired     Grooming: Wash/dry hands;Brushing hair;Sitting;Set up Grooming Details (indicate cue type and reason): uses L hand to reach back of head with comb, seated secondary to increased fatigue                 Toilet Transfer: Supervision/safety;Stand-pivot;Comfort height toilet;RW Toilet Transfer Details (indicate cue type and reason): close supervision onto elevated toilet seat Toileting- Clothing Manipulation and Hygiene: Sit to/from stand;Supervision/safety Toileting - Clothing Manipulation Details (indicate cue type and reason): close supervision for clothing management and hygiene when standing with RW       General ADL Comments: Good adherence to cervical precautions. Pt requiring close supervision for functional transfers/mobility                Cognition   Behavior During Therapy: [facility] for tasks assessed/performed Overall Cognitive Status: Within Functional Limits for tasks assessed                                    Pertinent Vitals/ Pain       Pain Assessment: 0-10 Pain Score: 7  Pain Location: neck Pain Descriptors / Indicators: Aching;Operative site guarding Pain Intervention(s): Limited activity within patient's tolerance;Monitored during session;Repositioned         Frequency Min 2X/week     Progress Toward Goals  OT Goals(current goals can now be found in the care plan section)  Progress towards OT goals: Progressing toward goals     Plan Discharge plan remains appropriate  Co-evaluation                 End of Session Equipment Utilized During Treatment: Cervical collar   Activity Tolerance Patient tolerated treatment well   Patient Left with call bell/phone within reach;in bed   Nurse Communication          Time:  1023-1040 OT Time Calculation (min): 17 min  Charges: OT General Charges $OT Visit: 1 Procedure OT Treatments $Self Care/Home Management : 8-22 mins  Pittman, Abdulaziz Toman L , MS, OTR/L 07/13/2015, 10:52 AM

## 2015-07-13 NOTE — Progress Notes (Signed)
Physical Therapy Treatment Patient Details Name: Jo LaughterLinda Owens MRN: [id number] DOB: 01-Oct-1955 Today's Date: 07/13/2015    History of Present Illness 59 y.o. female s/p second stage of a two-stage procedure which was posterior cervical cervical fusion for augmentation following anterior cervical corpectomy and discectomy. Posterior cervical fusion from C2-C7.    PT Comments    Patient continues to make slow progress. She is modified independent to supervision with all basic mobility and adheres to cervical precautions. She continues to report she feels her legs are too weak to ascend the 16 steps into her apartment (she was able to ascend/descend 4 today with minguard assist). She now reports there is no one to take care of her as her sister is having back surgery today.  She acknowledged this was a planned surgery.   Follow Up Recommendations  Home health PT;Supervision for mobility/OOB     Equipment Recommendations  None recommended by PT    Recommendations for Other Services       Precautions / Restrictions Precautions Precautions: Cervical;Fall Required Braces or Orthoses: Cervical Brace Cervical Brace: Hard collar Restrictions Weight Bearing Restrictions: No    Mobility  Bed Mobility Overal bed mobility: Needs Assistance Bed Mobility: Sidelying to Sit Rolling: Modified independent (Device/Increase time) Sidelying to sit: Supervision   Sit to sidelying: Supervision General bed mobility comments: bed flat, no rail, proper technique  Transfers Overall transfer level: Modified independent Equipment used: Rolling walker (2 wheeled) Transfers: Sit to/from Stand Sit to Stand: Modified independent (Device/Increase time)        General transfer comment: x3; proper sequencing   Ambulation/Gait Ambulation/Gait assistance: Min guard Ambulation Distance (Feet): 45 Feet Assistive device: Rolling walker (2 wheeled) Gait Pattern/deviations: Step-through pattern;Decreased  stride length;Shuffle Gait velocity: very slow Gait velocity interpretation: <1.8 ft/sec, indicative of risk for recurrent falls General Gait Details: pt initially able to walk and roll RW at same time "like a grocery cart" vs stop/start, however as she fatigued she reverted to previous method   Stairs Stairs: Yes Stairs assistance: Min guard Stair Management: One rail Right;Step to pattern;Sideways Number of Stairs: 4 General stair comments: pt felt more safe going sideways. On 4 th step her legs began to tremble, stating she felt too weak to go any further. On descent her legs were not trembling  Wheelchair Mobility    Modified Rankin (Stroke Patients Only)       Balance                           Cognition Arousal/Alertness: Awake/alert Behavior During Therapy: WFL for tasks assessed/performed Overall Cognitive Status: Within Functional Limits for tasks assessed                      Exercises      General Comments General comments (skin integrity, edema, etc.): Pt reported she sat up in chair >1 hour this morning      Pertinent Vitals/Pain Pain Assessment: 0-10 Pain Score: 7  Pain Location: neck shoulders' Pain Descriptors / Indicators: Aching;Operative site guarding Pain Intervention(s): Limited activity within patient's tolerance;Monitored during session;Repositioned    Home Living                      Prior Function            PT Goals (current goals can now be found in the care plan section) Acute Rehab PT Goals Patient Stated Goal: pt is concerned  about getting up into her home--16 stairs Time For Goal Achievement: 07/22/15 Progress towards PT goals: Progressing toward goals    Frequency  Min 5X/week    PT Plan Current plan remains appropriate    Co-evaluation             End of Session Equipment Utilized During Treatment: Gait belt Activity Tolerance: Patient limited by fatigue Patient left: with call  bell/phone within reach;in bed;with bed alarm set     Time: 0931-1010 PT Time Calculation (min) (ACUTE ONLY): 39 min  Charges:  $Gait Training: 23-37 mins                    G Codes:      [name] 2015-08-08, 11:11 AM Pager [phone]

## 2015-07-14 NOTE — Progress Notes (Signed)
Thank you for consult on [name]. Chart reveiwed and note that she was admitted for second part cervical decompression due to cervical myelopathy.  She is doing well with therapy and is limited by endurance issues.  She is modified independent to supervision with all mobility and No OT reported. She is too high level for CIR and would recommend SNF for follow up therapy if family unable to provide supervision.

## 2015-07-14 NOTE — Progress Notes (Addendum)
Physical Therapy Treatment Patient Details Name: [name] MRN: [id number] DOB: [birth year] Today's Date: 07/14/2015    History of Present Illness 59 y.o. female s/p second stage of a two-stage procedure which was posterior cervical cervical fusion for augmentation following anterior cervical corpectomy and discectomy. Posterior cervical fusion from C2-C7.    PT Comments    Patient continues to mobilize well with regards to adhering to precautions. Her ambulation tolerance continues to be limited to 45 ft at most, when her legs begin to shake. Today she was able to climb 8 steps (very slowly) with minguard assist, and again limited by leg fatigue/shaking. She has 16 steps into her apartment and currently cannot climb these safely.   Follow Up Recommendations  SNF (per Rehab Consult, pt is not an inpatient rehab candidate); supervision/assist for mobility (notified Minerva Areola, SW re: change in discharge plan to SNF)     Equipment Recommendations  None recommended by PT    Recommendations for Other Services       Precautions / Restrictions Precautions Precautions: Cervical;Fall Required Braces or Orthoses: Cervical Brace Cervical Brace: Hard collar    Mobility  Bed Mobility               General bed mobility comments: sitting EOB on arrival   Transfers Overall transfer level: Modified independent Equipment used: Rolling walker (2 wheeled) Transfers: Sit to/from Stand Sit to Stand: Modified independent (Device/Increase time)         General transfer comment: x3; proper sequencing   Ambulation/Gait Ambulation/Gait assistance: Supervision Ambulation Distance (Feet): 45 Feet Assistive device: Rolling walker (2 wheeled) Gait Pattern/deviations: Step-through pattern;Decreased stride length Gait velocity: very slow   General Gait Details: better technique with walking and rolling RW at the same time, keeping RW closer to her   Stairs Stairs: Yes Stairs assistance: Min  guard Stair Management: One rail Right;Sideways;Step to pattern Number of Stairs: 8 (2 x 4 reps) General stair comments: pt recalled technique going sideways. Moves slowly and cautiously (appropriately as she cannot see her feet due to neck brace); after 8 steps, legs trembling and pt reported she needed to sit  Wheelchair Mobility    Modified Rankin (Stroke Patients Only)       Balance     Sitting balance-Leahy Scale: Good       Standing balance-Leahy Scale: Fair                      Cognition Arousal/Alertness: Awake/alert Behavior During Therapy: WFL for tasks assessed/performed Overall Cognitive Status: Within Functional Limits for tasks assessed                      Exercises      General Comments General comments (skin integrity, edema, etc.): Discussed discharge plan      Pertinent Vitals/Pain Pain Assessment: 0-10 Pain Score: 6  Pain Location: neck, shoulders Pain Descriptors / Indicators: Aching;Operative site guarding Pain Intervention(s): Limited activity within patient's tolerance;Monitored during session;Repositioned    Home Living                      Prior Function            PT Goals (current goals can now be found in the care plan section) Acute Rehab PT Goals Patient Stated Goal: pt is concerned about getting up into her home--16 stairs Time For Goal Achievement: 07/22/15 Progress towards PT goals: Progressing toward goals (slowly)    Frequency  Min 5X/week    PT Plan Discharge plan needs to be updated    Co-evaluation             End of Session Equipment Utilized During Treatment: Gait belt;Oxygen Activity Tolerance: Patient limited by fatigue Patient left: with call bell/phone within reach;in chair     Time: [id number] PT Time Calculation (min) (ACUTE ONLY): 33 min  Charges:  $Gait Training: 23-37 mins                    G Codes:      Seva Chancy 07/14/2015, 3:24 PM Pager [phone]

## 2015-07-14 NOTE — Progress Notes (Signed)
Subjective: Patient reports Doing some better pain better control but still weak and difficult getting her up and about and ambulating alone.  Objective: Vital signs in last 24 hours: Temp:  [97.8 F (36.6 C)-98.4 F (36.9 C)] 98.3 F (36.8 C) (12/15 0544) Pulse Rate:  [90-102] 90 (12/15 0544) Resp:  [16-20] 18 (12/15 0544) BP: (95-114)/(63-73) 103/65 mmHg (12/15 0544) SpO2:  [90 %-97 %] 94 % (12/15 0544)  Intake/Output from previous day: 12/14 0701 - 12/15 0700 In: 120 [P.O.:120] Out: -  Intake/Output this shift:    No focal weakness incisions clean dry and intact  Lab Results: No results for input(s): WBC, HGB, HCT, PLT in the last 72 hours. BMET No results for input(s): NA, K, CL, CO2, GLUCOSE, BUN, CREATININE, CALCIUM in the last 72 hours.  Studies/Results: No results found.  Assessment/Plan: Continue mobilizes today with physical occupational therapy. Would like the caseworker and the inpatient rehabilitation people to consult on the patient for evaluation for a week or 2 of inpatient rehabilitation stay to get the patient more independent as she has no help at home.  LOS: 6 days     Xadrian Craighead P 07/14/2015, 7:33 AM

## 2015-07-15 NOTE — Progress Notes (Signed)
Subjective: Patient reports Feeling better less neck pain  Objective: Vital signs in last 24 hours: Temp:  [97.4 F (36.3 C)-98.5 F (36.9 C)] 98.4 F (36.9 C) (12/16 0536) Pulse Rate:  [85-101] 89 (12/16 0536) Resp:  [16-20] 20 (12/16 0536) BP: (89-110)/(52-71) 110/60 mmHg (12/16 0536) SpO2:  [90 %-97 %] 90 % (12/16 0536)  Intake/Output from previous day:   Intake/Output this shift:    Awake alert strength 5 out of 5 wound is clean dry and intact  Lab Results: No results for input(s): WBC, HGB, HCT, PLT in the last 72 hours. BMET No results for input(s): NA, K, CL, CO2, GLUCOSE, BUN, CREATININE, CALCIUM in the last 72 hours.  Studies/Results: No results found.  Assessment/Plan: Continue with physical and occupational therapy work on placement  LOS: 7 days     Yesli Vanderhoff P 07/15/2015, 7:22 AM

## 2015-07-15 NOTE — Progress Notes (Signed)
Physical Therapy Treatment Patient Details Name: Jo LaughterLinda Owens MRN: [id number] DOB: 01-Apr-1956 Today's Date: 07/15/2015    History of Present Illness 59 y.o. female s/p second stage of a two-stage procedure which was posterior cervical cervical fusion for augmentation following anterior cervical corpectomy and discectomy. Posterior cervical fusion from C2-C7.    PT Comments    Pt able to increase ambulation distance today with 02 intact. Pt with UE fatigue with gait. Probable dc to SNF tomorrow per SW.  Follow Up Recommendations  SNF;Supervision for mobility/OOB     Equipment Recommendations  None recommended by PT    Recommendations for Other Services       Precautions / Restrictions Precautions Precautions: Cervical;Fall Precaution Comments: monitor O2 and heart rate with mobility Required Braces or Orthoses: Cervical Brace Cervical Brace: Hard collar Restrictions Weight Bearing Restrictions: No    Mobility  Bed Mobility               General bed mobility comments: up in recliner upon arrival  Transfers Overall transfer level: Modified independent Equipment used: Rolling walker (2 wheeled) Transfers: Sit to/from Stand Sit to Stand: Modified independent (Device/Increase time)            Ambulation/Gait Ambulation/Gait assistance: Supervision;Min guard Ambulation Distance (Feet): 60 Feet (x 2) Assistive device: Rolling walker (2 wheeled) Gait Pattern/deviations: Step-through pattern Gait velocity: decreased, but improved from previous session   General Gait Details: decreased shakiness today, pt c/o arm fatigue   Stairs         General stair comments: deferred as pt is going to SNF tomorrow  Wheelchair Mobility    Modified Rankin (Stroke Patients Only)       Balance     Sitting balance-Leahy Scale: Good       Standing balance-Leahy Scale: Fair                      Cognition Arousal/Alertness: Awake/alert Behavior During  Therapy: WFL for tasks assessed/performed Overall Cognitive Status: Within Functional Limits for tasks assessed                      Exercises General Exercises - Lower Extremity Ankle Circles/Pumps: AROM;Both;10 reps;Supine    General Comments General comments (skin integrity, edema, etc.): SW leaving upon entry stating pt will be going to [place], probably tomorrow.  Educated in LE therex she can do in sitting for strengthening.      Pertinent Vitals/Pain Pain Assessment: Faces Faces Pain Scale: Hurts little more Pain Location: neck Pain Descriptors / Indicators: Aching;Operative site guarding Pain Intervention(s): Limited activity within patient's tolerance    Home Living                      Prior Function            PT Goals (current goals can now be found in the care plan section) Acute Rehab PT Goals Patient Stated Goal: pt is concerned about getting up into her home--16 stairs PT Goal Formulation: With patient Time For Goal Achievement: 07/22/15 Potential to Achieve Goals: Good Progress towards PT goals: Progressing toward goals    Frequency  Min 5X/week    PT Plan Current plan remains appropriate    Co-evaluation             End of Session Equipment Utilized During Treatment: Gait belt;Oxygen Activity Tolerance: Patient tolerated treatment well Patient left: in chair;with call bell/phone within reach;with chair alarm set  Time: 5409-8119 PT Time Calculation (min) (ACUTE ONLY): 15 min  Charges:  $Gait Training: 8-22 mins                    G Codes:      [name] 07/15/2015, 3:50 PM

## 2015-07-15 NOTE — Clinical Social Work Note (Signed)
Clinical Social Work Assessment  Patient Details  Name: [name] MRN: [id number] Date of Birth: 04-15-1956  Date of referral:  07/15/15               Reason for consult:  Facility Placement                Permission sought to share information with:  Facility Medical sales representativeContact Representative, Family Supports Permission granted to share information::  Yes, Verbal Permission Granted  Name::     Jo SousaMartha Owens Sister [phone]  Agency::  SNF admissions  Relationship::     Contact Information:     Housing/Transportation Living arrangements for the past 2 months:  Single Family Home Source of Information:  Patient Patient Interpreter Needed:  None Criminal Activity/Legal Involvement Pertinent to Current Situation/Hospitalization:  No - Comment as needed Significant Relationships:  Adult Children, Siblings Lives with:  Self Do you feel safe going back to the place where you live?  Yes (Patient feels like she needs some short term rehab before she is able to return back home.) Need for family participation in patient care:  No (Coment)  Care giving concerns:  Patient feel she needs some short term rehab before she is able to return back home.   Social Worker assessment / plan: Patient is a 59 year old female who lives alone.  Patient is alert and oriented x4 and able to make her own decisions.  Patient states she has never been to SNF for short term rehab, CSW explained process to patient and told her what to expect at short term rehab.  Patient expressed she is looking forward to getting her rehab and returning back home.  Patient expressed that did not have any other questions regarding SNF search process.  Patient is motivated to begin her therapy.   Employment status:  Other (Comment) (Patient still works sometimes) Health and safety inspectornsurance information:  Managed Care PT Recommendations:  Skilled Nursing Facility Information / Referral to community resources:     Patient/Family's Response to care:  Patient  agreeable to going to SNF for short term rehab. Patient/Family's Understanding of and Emotional Response to Diagnosis, Current Treatment, and Prognosis: Patient is aware of current diagnosis and current treatment plan.  Emotional Assessment Appearance:  Appears stated age Attitude/Demeanor/Rapport:    Affect (typically observed):  Appropriate, Calm, Pleasant Orientation:  Oriented to Self, Oriented to Place, Oriented to  Time, Oriented to Situation Alcohol / Substance use:  Not Applicable Psych involvement (Current and /or in the community):  No (Comment)  Discharge Needs  Concerns to be addressed:  Lack of Support Readmission within the last 30 days:  No Current discharge risk:  Lives alone Barriers to Discharge:  No Barriers Identified   [name], [name], LCSWA 07/15/2015, 6:00 PM

## 2015-07-15 NOTE — [place] FL2 (Signed)
MEDICAID FL2 LEVEL OF CARE SCREENING TOOL     IDENTIFICATION  Patient Name: Jo LaughterLinda Owens Birthdate: 03/03/1956 Sex: female Admission Date (Current Location): 07/08/2015  [facility] [place] [employer]   Facility and Address:  [facility], [address], [place], [place] [address]      Provider Number: [id number]  Attending Physician Name and Address:  Donalee CitrinGary Leanah Kolander, MD  Relative Name and Phone Number:  [name], Sister [phone]    Current Level of Care: Hospital Recommended Level of Care: Skilled Nursing Facility Prior Approval Number:    Date Approved/Denied:   PASRR Number: [id number]  Discharge Plan:      Current Diagnoses: Patient Active Problem List   Diagnosis Date Noted  . Myelopathy, spondylogenic, cervical 06/22/2015  . Preoperative clearance 06/15/2015  . Chronic diastolic congestive heart failure (HCC) 08/02/2013  . Emphysema/COPD Gold B 07/31/2013  . Sleep disorder/? OSA 07/31/2013  . Abnormal TSH 07/31/2013  . 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 Mood disorder (HCC)   . S/P rotator cuff repair   . H/O laminectomy     Orientation RESPIRATION BLADDER Height & Weight    Self, Time, Situation, Place  O2 (1.5 L/min) Incontinent, Continent 5' 2.5" (158.8 cm) 158 lbs.  BEHAVIORAL SYMPTOMS/MOOD NEUROLOGICAL BOWEL NUTRITION STATUS      Incontinent, Continent Diet (Regular)  AMBULATORY STATUS COMMUNICATION OF NEEDS Skin   Limited Assist Verbally Surgical wounds                       Personal Care Assistance Level of Assistance  Bathing, Dressing Bathing Assistance: Limited assistance   Dressing Assistance: Limited assistance     Functional Limitations Info             SPECIAL CARE FACTORS FREQUENCY                       Contractures      Additional Factors Info  Psychotropic, Allergies, Code Status Code Status Info: Full Allergies Info: NKA Psychotropic Info:  citalopram (CELEXA) tablet 40 mg, buPROPion (WELLBUTRIN SR) 12 hr tablet 100 mg         Current Medications (07/15/2015):  This is the current hospital active medication list Current Facility-Administered Medications  Medication Dose Route Frequency Provider Last Rate Last Dose  . 0.9 %  sodium chloride infusion  250 mL Intravenous Continuous Donalee CitrinGary Livi Mcgann, MD      . acetaminophen (TYLENOL) tablet 650 mg  650 mg Oral Q4H PRN Donalee CitrinGary Amalio Loe, MD       Or  . acetaminophen (TYLENOL) suppository 650 mg  650 mg Rectal Q4H PRN Donalee CitrinGary Joletta Manner, MD      . budesonide-formoterol [facility](SYMBICORT) 160-4.5 MCG/ACT inhaler 2 puff  2 puff Inhalation BID Donalee CitrinGary Wilbon Obenchain, MD   2 puff at 07/15/15 0950  . buPROPion [facility](WELLBUTRIN SR) 12 hr tablet 100 mg  100 mg Oral BID Donalee CitrinGary Deniqua Perry, MD   100 mg at 07/15/15 0949  . cholecalciferol (VITAMIN D) tablet 4,000 Units  4,000 Units Oral Daily Donalee CitrinGary London Nonaka, MD   4,000 Units at 07/15/15 [phone]  . citalopram (CELEXA) tablet 40 mg  40 mg Oral Daily Donalee CitrinGary Kanai Berrios, MD   40 mg at 07/15/15 0949  . cyclobenzaprine (FLEXERIL) tablet 10 mg  10 mg Oral TID PRN Donalee CitrinGary Shakeena Kafer, MD   10 mg at 07/14/15 2052  . docusate sodium (COLACE) capsule 100 mg  100 mg Oral BID  Donalee Citrin, MD   100 mg at 07/15/15 0949  . estrogens (conjugated) (PREMARIN) tablet 0.3 mg  0.3 mg Oral Daily Donalee Citrin, MD   0.3 mg at 07/14/15 1239   And  . medroxyPROGESTERone (PROVERA) tablet 1.25 mg  1.25 mg Oral Daily Donalee Citrin, MD   1.25 mg at 07/15/15 0949  . fluconazole (DIFLUCAN) tablet 100 mg  100 mg Oral Daily Donalee Citrin, MD   100 mg at 07/15/15 0950  . fluticasone (FLONASE) 50 MCG/ACT nasal spray 1 spray  1 spray Each Nare Daily Donalee Citrin, MD   1 spray at 07/15/15 0950  . furosemide (LASIX) tablet 20 mg  20 mg Oral Daily Donalee Citrin, MD   20 mg at 07/15/15 0950  . guaiFENesin (MUCINEX) 12 hr tablet 600 mg  600 mg Oral BID Donalee Citrin, MD   600 mg at 07/15/15 0950  . HYDROmorphone (DILAUDID) injection 0.5-1 mg  0.5-1 mg Intravenous Q2H PRN Donalee Citrin, MD   1 mg  at 07/14/15 0550  . indomethacin (INDOCIN SR) capsule 75 mg  75 mg Oral BID Donalee Citrin, MD   75 mg at 07/15/15 0950  . ipratropium-albuterol (DUONEB) 0.5-2.5 (3) MG/3ML nebulizer solution 3 mL  3 mL Inhalation BID Donalee Citrin, MD   3 mL at 07/15/15 1020  . lactobacillus acidophilus (BACID) tablet 2 tablet  2 tablet Oral TID Jinger Neighbors, NP   2 tablet at 07/15/15 0950  . magic mouthwash  15 mL Oral QID PRN Donalee Citrin, MD   15 mL at 07/12/15 1559  . menthol-cetylpyridinium (CEPACOL) lozenge 3 mg  1 lozenge Oral PRN Donalee Citrin, MD       Or  . phenol (CHLORASEPTIC) mouth spray 1 spray  1 spray Mouth/Throat PRN Donalee Citrin, MD      . methocarbamol (ROBAXIN) tablet 500 mg  500 mg Oral Q6H PRN Donalee Citrin, MD   500 mg at 07/15/15 0949  . ondansetron (ZOFRAN) injection 4 mg  4 mg Intravenous Q4H PRN Donalee Citrin, MD      . oxyCODONE-acetaminophen (PERCOCET/ROXICET) 5-325 MG per tablet 1-2 tablet  1-2 tablet Oral Q4H PRN Donalee Citrin, MD   1 tablet at 07/15/15 [phone]  . pantoprazole (PROTONIX) EC tablet 40 mg  40 mg Oral Daily Donalee Citrin, MD   40 mg at 07/15/15 0949  . polyethylene glycol (MIRALAX / GLYCOLAX) packet 17 g  17 g Oral Daily Donalee Citrin, MD   17 g at 07/15/15 0947  . sodium chloride 0.9 % injection 3 mL  3 mL Intravenous Q12H Donalee Citrin, MD   3 mL at 07/15/15 1011  . sodium chloride 0.9 % injection 3 mL  3 mL Intravenous PRN Donalee Citrin, MD      . traMADol Janean Sark) tablet 100 mg  100 mg Oral BID PRN Donalee Citrin, MD   100 mg at 07/14/15 2053     Discharge Medications: Please see discharge summary for a list of discharge medications.  Relevant Imaging Results:  Relevant Lab Results:   Additional Information    [place], Ervin Knack, LCSWA

## 2015-07-15 NOTE — Clinical Social Work Placement (Addendum)
   CLINICAL SOCIAL WORK PLACEMENT  NOTE  Date:  07/15/2015  Patient Details  Name: [name] MRN: [id number] Date of Birth: 1955-11-18  Clinical Social Work is seeking post-discharge placement for this patient at the Skilled  Nursing Facility level of care (*CSW will initial, date and re-position this form in  chart as items are completed):  Yes   Patient/family provided with [place] Clinical Social Work Department's list of facilities offering this level of care within the geographic area requested by the patient (or if unable, by the patient's family).  Yes   Patient/family informed of their freedom to choose among providers that offer the needed level of care, that participate in Medicare, Medicaid or managed care program needed by the patient, have an available bed and are willing to accept the patient.  Yes   Patient/family informed of [facility]'s ownership interest in [name] and [name], as well as of the fact that they are under no obligation to receive care at these facilities.  PASRR submitted to EDS on 07/15/15     PASRR number received on 07/15/15     Existing PASRR number confirmed on       FL2 transmitted to all facilities in geographic area requested by pt/family on 07/15/15     FL2 transmitted to all facilities within larger geographic area on 07/15/15     Patient informed that his/her managed care company has contracts with or will negotiate with certain facilities, including the following:        Yes   Patient/family informed of bed offers received.  [name]     Physician recommends and patient chooses bed at      [name]  Patient to be transferred to facility by  [facility]     Patient family notified on  07/18/15 of transfer.  Name of family member notified:   Patient will notify her family.     PHYSICIAN Please sign FL2     Additional Comment:     _______________________________________________ Ervin KnackEric R. Emmet Messer, MSW, Theresia MajorsLCSWA [phone] 07/18/2015 3:02 PM

## 2015-07-15 NOTE — Progress Notes (Signed)
PT Cancellation Note  Patient Details Name: Jo LaughterLinda Aho MRN: [id number] DOB: March 29, 1956   Cancelled Treatment:    Reason Eval/Treat Not Completed: Patient declined, no reason specified. Pt with visitors and reports being tired.  Requesting PT to come back later.  Will check back as schedule permits. Clydie BraunKaren L. Katrinka BlazingSmith, [place] Pager [phone] 07/15/2015    Darria Corvera LUBECK 07/15/2015, 2:18 PM

## 2015-07-15 NOTE — Clinical Social Work Note (Addendum)
CSW met with patient to present bed offers, patient chose [facility] for short term rehab.  CSW notified [facility] who completed paperwork with patient and can accept her over the weekend once she is medically ready for discharge and orders have been received, FL2 awaiting cosign by physician.  Jones Broom. [place], MSW, [place] 07/15/2015 5:56 PM

## 2015-07-16 MED ORDER — KETOROLAC TROMETHAMINE 30 MG/ML IJ SOLN
30.0000 mg | Freq: Three times a day (TID) | INTRAMUSCULAR | Status: AC
  Administered 2015-07-16 (×3): 30 mg via INTRAVENOUS
  Filled 2015-07-16 (×3): qty 1

## 2015-07-16 NOTE — Progress Notes (Signed)
Patient ID: Jo LaughterLinda Dunavant, female   DOB: 23-Jan-1956, 59 y.o.   MRN: [id number] Condition on more neck pain this morning had a very active night.  Strength out of 5 wound clean dry and intact neurologically nonfocal  We'll try Toradol for inflammation continue to mobilize with physical occupational therapy awaiting placement

## 2015-07-16 NOTE — Progress Notes (Signed)
Clinical Social Work  CSW spoke with RN who reports pt had increased pain. CSW updated [facility] who is agreeable to accept whenever medically stable.  Unk LightningHolly Jadyn Brasher, LCSW Weekend Coverage

## 2015-07-16 NOTE — Progress Notes (Signed)
PT Cancellation Note  Patient Details Name: Jo LaughterLinda Owens MRN: [id number] DOB: 09/15/1955   Cancelled Treatment:    Reason Eval/Treat Not Completed: Pain limiting ability to participate;Patient declined, no reason specified (Consulted with nursing, defer treatment due to pain today.)   Thomasene Mohairllen, Tangie Stay L 07/16/2015, 4:46 PM

## 2015-07-17 NOTE — Progress Notes (Signed)
Pt ambulated to bathroom and within the room with walker and tolerated it well.  Will continue to monitor.  Estanislado EmmsAshley Schwarz, RN

## 2015-07-17 NOTE — Progress Notes (Signed)
Patient ID: Jo LaughterLinda Owens, female   DOB: 1955-12-28, 59 y.o.   MRN: [id number] Patient feels much better today still a lot of soreness in the back of her neck but overall improved  Awake alert strength out of 5 incisions clean dry and intact  Ambulate today work on placement tomorrow.

## 2015-07-17 NOTE — Progress Notes (Signed)
Physical Therapy Treatment Patient Details Name: [name] MRN: [id number] DOB: [birth year] Today's Date: 07/17/2015    History of Present Illness 59 y.o. female s/p second stage of a two-stage procedure which was posterior cervical cervical fusion for augmentation following anterior cervical corpectomy and discectomy. Posterior cervical fusion from C2-C7.    PT Comments    Pt progressing towards physical therapy goals. Continues to perform all mobility extremely slow, and requires rest breaks for UE's due to fatigue with RW use. Pt anticipates d/c to SNF tomorrow. Will continue to follow and progress as able per POC.   Follow Up Recommendations  SNF;Supervision for mobility/OOB     Equipment Recommendations  None recommended by PT    Recommendations for Other Services       Precautions / Restrictions Precautions Precautions: Cervical;Fall Precaution Comments: monitor O2 and heart rate with mobility Required Braces or Orthoses: Cervical Brace Cervical Brace: Hard collar Restrictions Weight Bearing Restrictions: No    Mobility  Bed Mobility Overal bed mobility: Needs Assistance Bed Mobility: Sidelying to Sit;Rolling Rolling: Modified independent (Device/Increase time) Sidelying to sit: Supervision       General bed mobility comments: Pt was able to transition to EOB with supervision for safety. No physical assist required, however VC's were provided for proper log roll technique.   Transfers Overall transfer level: Modified independent Equipment used: Rolling walker (2 wheeled) Transfers: Sit to/from Stand Sit to Stand: Supervision         General transfer comment: Pt required VC's for proper hand placement on seated surface for safety.   Ambulation/Gait Ambulation/Gait assistance: Supervision Ambulation Distance (Feet): 60 Feet (x2) Assistive device: Rolling walker (2 wheeled) Gait Pattern/deviations: Step-through pattern;Decreased stride length;Trunk  flexed Gait velocity: Decreased Gait velocity interpretation: Below normal speed for age/gender General Gait Details: Pt complains of UE fatigue and took rest breaks to let UE's rest. Continues to move very slow and guarded.    Stairs            Wheelchair Mobility    Modified Rankin (Stroke Patients Only)       Balance Overall balance assessment: Needs assistance Sitting-balance support: Feet supported;No upper extremity supported Sitting balance-Leahy Scale: Good     Standing balance support: No upper extremity supported;During functional activity Standing balance-Leahy Scale: Fair                      Cognition Arousal/Alertness: Awake/alert Behavior During Therapy: WFL for tasks assessed/performed Overall Cognitive Status: Within Functional Limits for tasks assessed                      Exercises      General Comments        Pertinent Vitals/Pain Pain Assessment: Faces Faces Pain Scale: Hurts little more Pain Location: neck Pain Descriptors / Indicators: Operative site guarding;Discomfort Pain Intervention(s): Limited activity within patient's tolerance;Monitored during session;Repositioned    Home Living                      Prior Function            PT Goals (current goals can now be found in the care plan section) Acute Rehab PT Goals Patient Stated Goal: pt is concerned about getting up into her home--16 stairs PT Goal Formulation: With patient Time For Goal Achievement: 07/22/15 Potential to Achieve Goals: Good Progress towards PT goals: Progressing toward goals    Frequency  Min 5X/week  PT Plan Current plan remains appropriate    Co-evaluation             End of Session Equipment Utilized During Treatment: Gait belt;Oxygen Activity Tolerance: Patient tolerated treatment well Patient left: in chair;with call bell/phone within reach;with chair alarm set     Time: 1439-1456 PT Time Calculation  (min) (ACUTE ONLY): 17 min  Charges:  $Gait Training: 8-22 mins                    G Codes:      [name], [name] 07/17/2015, 3:39 PM  [name], PT, DPT Acute Rehabilitation Services Pager: [phone]

## 2015-07-18 MED ORDER — OXYCODONE-ACETAMINOPHEN 5-325 MG PO TABS: 1.0000 | ORAL_TABLET | ORAL | Status: DC | PRN

## 2015-07-18 NOTE — Progress Notes (Signed)
Patient ID: Jo LaughterLinda Dike, female   DOB: 1956-02-13, 59 y.o.   MRN: [id number] Awake alert oriented strength 5 out of 5  Incision is clean dry and intact  Awaiting placement

## 2015-07-18 NOTE — Progress Notes (Signed)
Physical Therapy Treatment Patient Details Name: [name] MRN: [id number] DOB: 06/07/1956 Today's Date: 07/18/2015    History of Present Illness 59 y.o. female s/p second stage of a two-stage procedure which was posterior cervical cervical fusion for augmentation following anterior cervical corpectomy and discectomy. Posterior cervical fusion from C2-C7.    PT Comments    Patient reports she had a "set back" over the weekend. Her BP got too low for her to have pain medicine and went hours without meds resulting in sever pain. Currently pain is under control and she tolerated ambulating 120 ft (however very slowly with 5 rest periods).    Follow Up Recommendations  SNF;Supervision for mobility/OOB     Equipment Recommendations  None recommended by PT    Recommendations for Other Services       Precautions / Restrictions Precautions Precautions: Cervical;Fall Required Braces or Orthoses: Cervical Brace Cervical Brace: Hard collar Restrictions Weight Bearing Restrictions: No    Mobility  Bed Mobility Overal bed mobility: Modified Independent Bed Mobility: Rolling;Sit to Sidelying Rolling: Modified independent (Device/Increase time)       Sit to sidelying: Modified independent (Device/Increase time) General bed mobility comments: incr time  Transfers Overall transfer level: Modified independent Equipment used: Rolling walker (2 wheeled) Transfers: Sit to/from Stand Sit to Stand: Modified independent (Device/Increase time)         General transfer comment: no cues needed  Ambulation/Gait Ambulation/Gait assistance: Supervision Ambulation Distance (Feet): 120 Feet Assistive device: Rolling walker (2 wheeled) Gait Pattern/deviations: Step-through pattern;Decreased stride length;Shuffle Gait velocity: Decreased   General Gait Details: Pt complains of UE fatigue and took 5 rest breaks to let UE's rest. Continues to move very slow and guarded.    Stairs            Wheelchair Mobility    Modified Rankin (Stroke Patients Only)       Balance     Sitting balance-Leahy Scale: Good       Standing balance-Leahy Scale: Fair                      Cognition Arousal/Alertness: Awake/alert Behavior During Therapy: WFL for tasks assessed/performed Overall Cognitive Status: Within Functional Limits for tasks assessed                      Exercises      General Comments        Pertinent Vitals/Pain Pain Assessment: Faces Faces Pain Scale: Hurts little more Pain Location: neck Pain Descriptors / Indicators: Operative site guarding Pain Intervention(s): Limited activity within patient's tolerance;Monitored during session;Repositioned    Home Living                      Prior Function            PT Goals (current goals can now be found in the care plan section) Acute Rehab PT Goals Patient Stated Goal: pt is concerned about getting up into her home--16 stairs Time For Goal Achievement: 07/22/15 Progress towards PT goals: Progressing toward goals    Frequency  Min 5X/week    PT Plan Current plan remains appropriate    Co-evaluation             End of Session Equipment Utilized During Treatment: Gait belt Activity Tolerance: Patient tolerated treatment well Patient left: with call bell/phone within reach;in bed;with bed alarm set     Time: 1229-1252 PT Time Calculation (min) (ACUTE ONLY): 23  min  Charges:  $Gait Training: 23-37 mins                    G Codes:      [name] 07/18/2015, 1:00 PM Pager [phone]

## 2015-07-18 NOTE — Progress Notes (Signed)
RN offered pt to sit up in the chair this am.  Pt refused and said "maybe later".  Will continue to monitor.   Estanislado EmmsAshley Schwarz, RN

## 2015-07-18 NOTE — Discharge Summary (Signed)
Physician Discharge Summary  Patient ID: [name] MRN: [id number] DOB/AGE: 1956-03-13 59 y.o.  Admit date: 07/08/2015 Discharge date: 07/18/2015  Admission Diagnoses: Cervical spondylitic myelopathy  Discharge Diagnoses: Same Active Problems:   Myelopathy, spondylogenic, cervical   Discharged Condition: good  Hospital Course: Patient is admitted hospital underwent second stage of a two-stage procedure with a posterior cervical mentation from C2 C7 postop patient did very well recovered in the floor on the floor she convalesced well had a lot of postoperative pain this was treated intermittently with steroids and Toradol and patient was progress immobilize and ultimately did very well by postoperative day to 7 was stable to be transferred to skilled nursing facility for ongoing rehabilitation. At time of discharge patient is angling and voiding tolerating regular diet.  Consults: Significant Diagnostic Studies: Treatments: Posterior cervical fusion C2-C7 Discharge Exam: Blood pressure 106/63, pulse 95, temperature 98.2 F (36.8 C), temperature source Oral, resp. rate 16, height 5' 2.5" (1.588 m), weight 71.668 kg (158 lb), SpO2 91 %. Strength out of 5 wound clean dry and intact  Disposition: Skilled nursing facility     Medication List    TAKE these medications        BREO ELLIPTA 200-25 MCG/INH Aepb  Generic drug:  Fluticasone Furoate-Vilanterol  Inhale 1 puff into the lungs daily.     buPROPion 100 MG 12 hr tablet  Commonly known as:  WELLBUTRIN SR  Take 100 mg by mouth 2 (two) times daily.     citalopram 40 MG tablet  Commonly known as:  CELEXA  Take 40 mg by mouth daily.     COMBIVENT RESPIMAT 20-100 MCG/ACT Aers respimat  Generic drug:  Ipratropium-Albuterol  Inhale 2 puffs into the lungs 2 (two) times daily.     fluticasone 50 MCG/ACT nasal spray  Commonly known as:  FLONASE  Place 1 spray into both nostrils daily.     furosemide 20 MG tablet  Commonly  known as:  LASIX  Take 1 tablet (20 mg total) by mouth daily.     guaiFENesin 600 MG 12 hr tablet  Commonly known as:  MUCINEX  Take 600 mg by mouth 2 (two) times daily.     HM VITAMIN D3 4000 UNITS Caps  Generic drug:  Cholecalciferol  Take 4,000 Units by mouth daily.     indomethacin 75 MG CR capsule  Commonly known as:  INDOCIN SR  Take 75 mg by mouth 2 (two) times daily.     methocarbamol 750 MG tablet  Commonly known as:  ROBAXIN  Take 0.5 tablets (375 mg total) by mouth 4 (four) times daily.     omeprazole 20 MG capsule  Commonly known as:  PRILOSEC  Take 1 capsule by mouth daily.     oxyCODONE-acetaminophen 5-325 MG tablet  Commonly known as:  PERCOCET/ROXICET  Take 1-2 tablets by mouth every 4 (four) hours as needed for moderate pain.     oxyCODONE-acetaminophen 5-325 MG tablet  Commonly known as:  PERCOCET/ROXICET  Take 1-2 tablets by mouth every 4 (four) hours as needed for moderate pain.     PREMPRO 0.3-1.5 MG tablet  Generic drug:  estrogen (conjugated)-medroxyprogesterone  Take 1 tablet by mouth daily.     traMADol 50 MG tablet  Commonly known as:  ULTRAM  Take 2 tablets by mouth 2 (two) times daily as needed for moderate pain.           Follow-up Information    Follow up with [facility] .  Specialty:  Skilled Nursing Facility   Contact information:   [address] [place] [id number] [phone]      Follow up with Mariam DollarRAM,Kimi Bordeau P, MD.   Specialty:  Neurosurgery   Contact information:   [address] [place] [address] [phone]       Signed: Mariam DollarCRAM,Reichen Hutzler P 07/18/2015, 2:06 PM

## 2015-07-18 NOTE — Clinical Social Work Note (Signed)
Patient to be d/c'ed today to [facility].  Patient and family agreeable to plans will transport via ems RN to call report.  Randie Bloodgood, MSW, LCSWA [phone]  

## 2015-07-18 NOTE — Progress Notes (Signed)
Discharge orders received, [facility]. D/c instructions and RX given to Ptar to transport to the facility. 07/18/15 1536

## 2015-07-19 ENCOUNTER — Non-Acute Institutional Stay (SKILLED_NURSING_FACILITY): Payer: 59 | Admitting: Adult Health

## 2015-07-19 DIAGNOSIS — K219 Gastro-esophageal reflux disease without esophagitis: Secondary | ICD-10-CM | POA: Diagnosis not present

## 2015-07-19 DIAGNOSIS — F329 Major depressive disorder, single episode, unspecified: Secondary | ICD-10-CM | POA: Diagnosis not present

## 2015-07-19 DIAGNOSIS — I5032 Chronic diastolic (congestive) heart failure: Secondary | ICD-10-CM

## 2015-07-19 DIAGNOSIS — F32A Depression, unspecified: Secondary | ICD-10-CM

## 2015-07-19 DIAGNOSIS — J439 Emphysema, unspecified: Secondary | ICD-10-CM | POA: Diagnosis not present

## 2015-07-19 DIAGNOSIS — Z79890 Hormone replacement therapy (postmenopausal): Secondary | ICD-10-CM | POA: Diagnosis not present

## 2015-07-19 DIAGNOSIS — J309 Allergic rhinitis, unspecified: Secondary | ICD-10-CM | POA: Diagnosis not present

## 2015-07-19 DIAGNOSIS — M4712 Other spondylosis with myelopathy, cervical region: Secondary | ICD-10-CM

## 2015-07-19 DIAGNOSIS — M1990 Unspecified osteoarthritis, unspecified site: Secondary | ICD-10-CM

## 2015-07-20 ENCOUNTER — Encounter: Payer: Self-pay | Admitting: Adult Health

## 2015-07-21 ENCOUNTER — Non-Acute Institutional Stay (SKILLED_NURSING_FACILITY): Payer: 59 | Admitting: Internal Medicine

## 2015-07-21 DIAGNOSIS — R5381 Other malaise: Secondary | ICD-10-CM

## 2015-07-21 DIAGNOSIS — K219 Gastro-esophageal reflux disease without esophagitis: Secondary | ICD-10-CM | POA: Diagnosis not present

## 2015-07-21 DIAGNOSIS — F39 Unspecified mood [affective] disorder: Secondary | ICD-10-CM | POA: Diagnosis not present

## 2015-07-21 DIAGNOSIS — I5032 Chronic diastolic (congestive) heart failure: Secondary | ICD-10-CM | POA: Diagnosis not present

## 2015-07-21 DIAGNOSIS — J439 Emphysema, unspecified: Secondary | ICD-10-CM | POA: Diagnosis not present

## 2015-07-21 DIAGNOSIS — M4712 Other spondylosis with myelopathy, cervical region: Secondary | ICD-10-CM

## 2015-07-21 NOTE — Progress Notes (Signed)
Patient ID: [name], female   DOB: 12-Jan-1956, 59 y.o.   MRN: [id number]     [facility]  PCP: Blane Ohara, MD  Code Status: full code  No Known Allergies  Chief Complaint  Patient presents with  . New Admit To SNF     HPI:  59 y.o. patient is here for short term rehabilitation post hospital admission from 07/08/15-07/18/15 post second stage of a two-stage procedure with a posterior cervical mentation from C2-C7. She is seen in her room today. Her pain is not under control with current pain regimen. She also has been experiencing muscle tightness. She mentions being on robaxin 750 mg qid at home. Her numbness and tingling in arms have improved since surgery. No other concerns. Current bowel regimen has been helpful.   Review of Systems:  Constitutional: Negative for fever, chills, diaphoresis.  HENT: Negative for headache, congestion, nasal discharge, difficulty swallowing.   Eyes: Negative for eye pain, blurred vision, double vision and discharge.  Respiratory: Negative for cough, shortness of breath and wheezing.   Cardiovascular: Negative for chest pain, palpitations, leg swelling.  Gastrointestinal: Negative for heartburn, nausea, vomiting, abdominal pain. Had bowel movement yesterday Genitourinary: Negative for dysuria.  Musculoskeletal: Negative for falls Skin: Negative for itching, rash.  Neurological: Negative for dizziness, tingling, focal weakness. Has generalized weakness Psychiatric/Behavioral: Negative for depression  Past Medical History  Diagnosis Date  . Mood disorder (HCC)   . Arthritis   . CHF (congestive heart failure) (HCC)     takes Lasix daily  . COPD (chronic obstructive pulmonary disease) (HCC)   . Emphysema lung (HCC)   . SOB (shortness of breath)     with exertion  . Asthma     Breo and Combivent daily  . Multiple allergies     Fluticasone and Mucinex daily  . Pneumonia under the age of 75    hx of  .  History of bronchitis 5-6wks ago  . Headache     daily   . Vertigo     takes Meclizine daily as needed  . Weakness     numbness and tingling in right arm and leg  . Joint pain   . Joint swelling   . GERD (gastroesophageal reflux disease)     takes Omeprazole daily  . History of blood transfusion 1983    no abnormal reaction noted  . Glaucoma     mild"  . Depression     takes Wellbutrin and Citalopram daily   Past Surgical History  Procedure Laterality Date  . Rotator cuff repair      right  . Back surgery      lumbar  . Tonsillectomy and adenoidectomy    . Tubal ligation    . Anterior cervical corpectomy N/A 06/22/2015    Procedure: ANTERIOR CERVICAL DECOMPRESSION/DISCECTOMY FUSION C6 - C7 with corpectomy;  Surgeon: Donalee Citrin, MD;  Location: [facility];  Service: Neurosurgery;  Laterality: N/A;  . Posterior fusion cervical spine  07/08/2015  . Posterior cervical fusion/foraminotomy  07/08/2015    Procedure: POSTERIOR CERVICAL FUSION/FORAMINOTOMY CERVICAL TWO TO CERVICAL SEVEN;  Surgeon: Donalee Citrin, MD;  Location: [facility];  Service: Neurosurgery;;   Social History:   reports that she has quit smoking. Her smoking use included Cigarettes. She smoked 0.00 packs per day for 0 years. She has never used smokeless tobacco. She reports that she does not drink alcohol or use illicit drugs.  Family History  Problem Relation Age  of Onset  . CAD Father 59  . COPD Mother   . Heart disease Mother     Valve disease  . CAD Brother 50    Mild CAD  . Emphysema Paternal Grandfather   . Allergies Mother   . Asthma Mother     Medications:   Medication List       This list is accurate as of: 07/21/15  8:29 AM.  Always use your most recent med list.               BREO ELLIPTA 200-25 MCG/INH Aepb  Generic drug:  Fluticasone Furoate-Vilanterol  Inhale 1 puff into the lungs daily.     buPROPion 100 MG 12 hr tablet  Commonly known as:  WELLBUTRIN SR  Take 100 mg by mouth 2  (two) times daily.     citalopram 40 MG tablet  Commonly known as:  CELEXA  Take 40 mg by mouth daily.     COMBIVENT RESPIMAT 20-100 MCG/ACT Aers respimat  Generic drug:  Ipratropium-Albuterol  Inhale 2 puffs into the lungs 2 (two) times daily.     fluticasone 50 MCG/ACT nasal spray  Commonly known as:  FLONASE  Place 1 spray into both nostrils daily.     furosemide 20 MG tablet  Commonly known as:  LASIX  Take 1 tablet (20 mg total) by mouth daily.     guaiFENesin 600 MG 12 hr tablet  Commonly known as:  MUCINEX  Take 600 mg by mouth 2 (two) times daily.     HM VITAMIN D3 4000 UNITS Caps  Generic drug:  Cholecalciferol  Take 4,000 Units by mouth daily.     indomethacin 75 MG CR capsule  Commonly known as:  INDOCIN SR  Take 75 mg by mouth 2 (two) times daily.     methocarbamol 750 MG tablet  Commonly known as:  ROBAXIN  Take 0.5 tablets (375 mg total) by mouth 4 (four) times daily.     omeprazole 20 MG capsule  Commonly known as:  PRILOSEC  Take 1 capsule by mouth daily.     oxyCODONE-acetaminophen 5-325 MG tablet  Commonly known as:  PERCOCET/ROXICET  Take 1-2 tablets by mouth every 4 (four) hours as needed for moderate pain.     oxyCODONE-acetaminophen 5-325 MG tablet  Commonly known as:  PERCOCET/ROXICET  Take 1-2 tablets by mouth every 4 (four) hours as needed for moderate pain.     PREMPRO 0.3-1.5 MG tablet  Generic drug:  estrogen (conjugated)-medroxyprogesterone  Take 1 tablet by mouth daily.     traMADol 50 MG tablet  Commonly known as:  ULTRAM  Take 2 tablets by mouth 2 (two) times daily as needed for moderate pain.         Physical Exam: Filed Vitals:   07/21/15 0828  BP: 104/64  Pulse: 81  Temp: 97.8 F (36.6 C)  Resp: 19  Weight: 156 lb 6.4 oz (70.943 kg)  SpO2: 93%    General- elderly female, well built, in no acute distress Head- normocephalic, atraumatic Nose- normal nasal mucosa, no maxillary or frontal sinus tenderness, no nasal  discharge Throat- moist mucus membrane  Eyes- PERRLA, EOMI, no pallor, no icterus, no discharge, normal conjunctiva, normal sclera Neck- no cervical lymphadenopathy, has cervical brace Cardiovascular- normal s1,s2, no murmurs, palpable dorsalis pedis and radial pulses, no leg edema Respiratory- bilateral clear to auscultation, no wheeze, no rhonchi, no crackles, no use of accessory muscles Abdomen- bowel sounds present, soft, non tender Musculoskeletal-  able to move all 4 extremities, generalized weakness, neck movement limited with her brace in place Neurological- no focal deficit, alert and oriented to person, place and time Skin- warm and dry, surgical incision on posterior neck with sutures in place and appears clean and dry Psychiatry- normal mood and affect    Labs reviewed: Basic Metabolic Panel:  Recent Labs  40/98/1111/21/16 1520 07/08/15 0639  NA 138 137  K 4.1 3.7  CL 106 104  CO2 24 26  GLUCOSE 83 84  BUN 11 8  CREATININE 1.02* 0.97  CALCIUM 9.0 8.9   CBC:  Recent Labs  06/20/15 1520 07/08/15 0639  WBC 7.8 9.9  HGB 14.4 13.5  HCT 42.7 40.9  MCV 93.6 93.8  PLT 219 292     Assessment/Plan  Physical deconditioning Will have patient work with PT/OT as tolerated to regain strength and restore function.  Fall precautions are in place.  Cervical spondylosis with myelopathy S/p cervical fusion. Will have her work with physical therapy and occupational therapy team to help with gait training and muscle strengthening exercises.fall precautions. Skin care. Encourage to be out of bed. With her pain not under control with current regimen, change percocet to 5-325 mg 1-2 tab q3h prn pain and increase robaxin to 500 mg qid for now. Continue tramadol 100 mg bid prn. Monitor for sedation and fall. Check cbc and cmp. Continue indomethacin 75 mg bid. Has follow up with neurosurgery on 08/07/15. Will get PMR consult for pain management  gerd Stable, continue prilosec 20 mg  daily  CHF Stable, continue lasix 20 mg daily, monitor bmp and BP  Emphysema Stable, continue combivent and breo ellipta with prn mucinex  Mood disorder Stable, continue wellbutrin 100 mg bid and celexa 40 mg daily  Goals of care: short term rehabilitation   Labs/tests ordered: cbc, bmp  Family/ staff Communication: reviewed care plan with patient and nursing supervisor    Oneal GroutMAHIMA Keiji Melland, MD  [facility] [phone] (Monday-Friday 8 am - 5 pm) [phone] (afterhours)

## 2015-07-21 NOTE — Progress Notes (Signed)
Patient ID: [name], female   DOB: 26-Feb-1956, 59 y.o.   MRN: [id number]    DATE:  07/19/15  MRN:  [id number]  BIRTHDAY: [birth year]  Facility:  Nursing Home Location:  [facility]  Nursing Home Room Number: 704-P  LEVEL OF CARE:  SNF (31)  Contact Information    Name Relation Home Work Yorklyn Sister [phone]     Lenna Sciara   [phone]       Chief Complaint  Patient presents with  . Hospitalization Follow-up    Cervical spondylitic melopathy S/P second stage posterior cervical mentation from C2-C7 posterior cervical fusion, COPD depression, arthritis, GERD, HRT, CHF and allergic rhinitis      HISTORY OF PRESENT ILLNESS:  This is a 59 year old female who has been admitted to [facility] on 07/18/15 from [facility]. She had a second stage procedure with a posterior cervical mentation from C2-C7 posterior cervical fusion on 07/08/15. She has been admitted for a short-term rehabilitation.   PAST MEDICAL HISTORY:  Past Medical History  Diagnosis Date  . Mood disorder (HCC)   . Arthritis   . CHF (congestive heart failure) (HCC)     takes Lasix daily  . COPD (chronic obstructive pulmonary disease) (HCC)   . Emphysema lung (HCC)   . SOB (shortness of breath)     with exertion  . Asthma     Breo and Combivent daily  . Multiple allergies     Fluticasone and Mucinex daily  . Pneumonia under the age of 59    hx of  . History of bronchitis 5-6wks ago  . Headache     daily   . Vertigo     takes Meclizine daily as needed  . Weakness     numbness and tingling in right arm and leg  . Joint pain   . Joint swelling   . GERD (gastroesophageal reflux disease)     takes Omeprazole daily  . History of blood transfusion 1983    no abnormal reaction noted  . Glaucoma     mild"  . Depression     takes Wellbutrin and Citalopram daily     CURRENT MEDICATIONS: Reviewed  Patient's Medications  New Prescriptions   No  medications on file  Previous Medications   BUPROPION (WELLBUTRIN SR) 100 MG 12 HR TABLET    Take 100 mg by mouth 2 (two) times daily.    CHOLECALCIFEROL (HM VITAMIN D3) 4000 UNITS CAPS    Take 4,000 Units by mouth daily.    CITALOPRAM (CELEXA) 40 MG TABLET    Take 40 mg by mouth daily.   COMBIVENT RESPIMAT 20-100 MCG/ACT AERS RESPIMAT    Inhale 2 puffs into the lungs 2 (two) times daily.    FLUTICASONE (FLONASE) 50 MCG/ACT NASAL SPRAY    Place 1 spray into both nostrils daily.   FLUTICASONE FUROATE-VILANTEROL (BREO ELLIPTA) 200-25 MCG/INH AEPB    Inhale 1 puff into the lungs daily.   FUROSEMIDE (LASIX) 20 MG TABLET    Take 1 tablet (20 mg total) by mouth daily.   GUAIFENESIN (MUCINEX) 600 MG 12 HR TABLET    Take 600 mg by mouth 2 (two) times daily.   INDOMETHACIN (INDOCIN SR) 75 MG CR CAPSULE    Take 75 mg by mouth 2 (two) times daily.   METHOCARBAMOL (ROBAXIN) 750 MG TABLET    Take 0.5 tablets (375 mg total) by mouth 4 (four) times daily.   OMEPRAZOLE (PRILOSEC)  20 MG CAPSULE    Take 1 capsule by mouth daily.   OXYCODONE-ACETAMINOPHEN (PERCOCET/ROXICET) 5-325 MG TABLET    Take 1-2 tablets by mouth every 4 (four) hours as needed for moderate pain.   OXYCODONE-ACETAMINOPHEN (PERCOCET/ROXICET) 5-325 MG TABLET    Take 1-2 tablets by mouth every 4 (four) hours as needed for moderate pain.   PREMPRO 0.3-1.5 MG PER TABLET    Take 1 tablet by mouth daily.   TRAMADOL (ULTRAM) 50 MG TABLET    Take 2 tablets by mouth 2 (two) times daily as needed for moderate pain.   Modified Medications   No medications on file  Discontinued Medications   No medications on file     No Known Allergies   REVIEW OF SYSTEMS:  GENERAL: no change in appetite, no fatigue, no weight changes, no fever, chills or weakness EYES: Denies change in vision, dry eyes, eye pain, itching or discharge EARS: Denies change in hearing, ringing in ears, or earache NOSE: Denies nasal congestion or epistaxis MOUTH and THROAT: Denies  oral discomfort, gingival pain or bleeding, pain from teeth or hoarseness   RESPIRATORY: no cough, SOB, DOE, wheezing, hemoptysis CARDIAC: no chest pain, edema or palpitations GI: no abdominal pain, diarrhea, constipation, heart burn, nausea or vomiting GU: Denies dysuria, frequency, hematuria, incontinence, or discharge PSYCHIATRIC: Denies feeling of depression or anxiety. No report of hallucinations, insomnia, paranoia, or agitation   PHYSICAL EXAMINATION  GENERAL APPEARANCE: Well nourished. In no acute distress. Normal body habitus SKIN:  Surgical incision @ front of right neck has steri-strips, dry and no erythema; surgical incision @ back of neck with honeycomb dressing, no erythema and dry HEAD: Normal in size and contour. No evidence of trauma EYES: Lids open and close normally. No blepharitis, entropion or ectropion. PERRL. Conjunctivae are clear and sclerae are white. Lenses are without opacity EARS: Pinnae are normal. Patient hears normal voice tunes of the examiner MOUTH and THROAT: Lips are without lesions. Oral mucosa is moist and without lesions. Tongue is normal in shape, size, and color and without lesions NECK: supple, trachea midline, no neck masses, no thyroid tenderness, no thyromegaly LYMPHATICS: no LAN in the neck, no supraclavicular LAN RESPIRATORY: breathing is even & unlabored, BS CTAB CARDIAC: RRR, no murmur,no extra heart sounds, no edema GI: abdomen soft, normal BS, no masses, no tenderness, no hepatomegaly, no splenomegaly EXTREMITIES:  Able to move X 4 extremities; has Aspen collar on neck PSYCHIATRIC: Alert and oriented X 3. Affect and behavior are appropriate  LABS/RADIOLOGY: Labs reviewed: Basic Metabolic Panel:  Recent Labs  09/81/19 1520 07/08/15 0639  NA 138 137  K 4.1 3.7  CL 106 104  CO2 24 26  GLUCOSE 83 84  BUN 11 8  CREATININE 1.02* 0.97  CALCIUM 9.0 8.9    CBC:  Recent Labs  06/20/15 1520 07/08/15 0639  WBC 7.8 9.9  HGB 14.4  13.5  HCT 42.7 40.9  MCV 93.6 93.8  PLT 219 292    Dg Cervical Spine 2-3 Views  07/08/2015  CLINICAL DATA:  Cervical fusion. EXAM: DG C-ARM 61-120 MIN; CERVICAL SPINE - 2-3 VIEW COMPARISON:  07/05/2015 FINDINGS: Prior partially visualized anterior cervical discectomies and corpectomy with ventral plate. Current operative finding show posterior cervical fusion with multilevel lateral mass screws. Spinal alignment, including C2-3 adjacent segment anterolisthesis, is as previously seen. No evidence of intraoperative fracture. IMPRESSION: Fluoroscopy for posterior cervical fusion. Electronically Signed   By: Marnee Spring M.D.   On: 07/08/2015 11:12  Dg Cervical Spine 2 Or 3 Views  06/22/2015  CLINICAL DATA:  ACDF at C6-7 with C4 corpectomy. EXAM: DG C-ARM GT 120 MIN; CERVICAL SPINE - 2-3 VIEW : COMPARISON: 05/19/2015 FINDINGS: Three fluoroscopic spot images obtained intraoperatively demonstrate anterior plate and screw fixator spanning C3, C4, C5, and C6, with screw fixation at C3, C5, and C6. Ill definition of the inferior endplate of C3, C4, the upper part of C5. Small BB markers project along the inferior endplate of C3 and superior endplate of C5. These markers do not appear out of place. IMPRESSION: 1. Anterior cervical plate fixator spanning C3-C4-C5-C6 with screws at C3, C5, and C6. No complicating feature identified. Electronically Signed   By: Gaylyn RongWalter  Liebkemann M.D.   On: 06/22/2015 18:28   Dg Chest Port 1 View  06/25/2015  CLINICAL DATA:  88% saturation on room air EXAM: PORTABLE CHEST 1 VIEW COMPARISON:  None. FINDINGS: Heart size and vascular pattern normal. Mild hypoventilatory change at both lung bases due to elevated diaphragms. No pleural effusion. IMPRESSION: Mild bibasilar atelectasis related to suboptimal inspiratory effect. Electronically Signed   By: Esperanza Heiraymond  Rubner M.D.   On: 06/25/2015 15:45   Dg C-arm 1-60 Min  07/08/2015  CLINICAL DATA:  Cervical fusion. EXAM: DG C-ARM  61-120 MIN; CERVICAL SPINE - 2-3 VIEW COMPARISON:  07/05/2015 FINDINGS: Prior partially visualized anterior cervical discectomies and corpectomy with ventral plate. Current operative finding show posterior cervical fusion with multilevel lateral mass screws. Spinal alignment, including C2-3 adjacent segment anterolisthesis, is as previously seen. No evidence of intraoperative fracture. IMPRESSION: Fluoroscopy for posterior cervical fusion. Electronically Signed   By: Marnee SpringJonathon  Watts M.D.   On: 07/08/2015 11:12   Dg C-arm Gt 120 Min  06/22/2015  CLINICAL DATA:  ACDF at C6-7 with C4 corpectomy. EXAM: DG C-ARM GT 120 MIN; CERVICAL SPINE - 2-3 VIEW : COMPARISON: 05/19/2015 FINDINGS: Three fluoroscopic spot images obtained intraoperatively demonstrate anterior plate and screw fixator spanning C3, C4, C5, and C6, with screw fixation at C3, C5, and C6. Ill definition of the inferior endplate of C3, C4, the upper part of C5. Small BB markers project along the inferior endplate of C3 and superior endplate of C5. These markers do not appear out of place. IMPRESSION: 1. Anterior cervical plate fixator spanning C3-C4-C5-C6 with screws at C3, C5, and C6. No complicating feature identified. Electronically Signed   By: Gaylyn RongWalter  Liebkemann M.D.   On: 06/22/2015 18:28    ASSESSMENT/PLAN:  Cervical spondylitic myelopathy S/P second stage procedure with a posterior cervical mentation from C2-C7 posterior cervical fusion - for rehabilitation; continue Tramadol 50 mg 2 tabs BID PRN and Percocet 5/325 mg  1-2 tabs Q 4 hours PRN for pain; Robaxin 750 mg 1/2 tab = 375 mg PO QID; follow-up with Dr. Wynetta Emeryram, neuro surgery; check CBC and BMP  COPD - stable; continue Fluticasone-Furoate-Vilanterol inhale 1 puff daily and iprtropium-albuterol inhale 2 puffs into the lungs BID  Depression - mood is stable; continue Buproprion 100mg  12 hr 1 tab BID and Celexa 40 mg daily  Arthritis - continue Indomethacin 75 mg CR 1 capsule BID  GERD  - continue Prilosec 20 mg daily  HRT - continue Prempro 0.3-1.5 mg 1 tab daily  Chronic diastolic CHF - continue Lasix 20 mg daily; weigh Q Mondays and Wednesdays  Allergic rhinitis - continue Flonase 50 mcg/ACT 1 spray into each nostrils daily    Goals of care:  Short-term rehabilitation    [facility],Dontel Harshberger, NP [facility] [phone]

## 2015-08-09 ENCOUNTER — Encounter (HOSPITAL_COMMUNITY): Payer: Self-pay | Admitting: Neurosurgery

## 2015-08-10 ENCOUNTER — Encounter: Payer: Self-pay | Admitting: Adult Health

## 2015-08-10 ENCOUNTER — Non-Acute Institutional Stay (SKILLED_NURSING_FACILITY): Payer: BLUE CROSS/BLUE SHIELD | Admitting: Adult Health

## 2015-08-10 DIAGNOSIS — K219 Gastro-esophageal reflux disease without esophagitis: Secondary | ICD-10-CM

## 2015-08-10 DIAGNOSIS — F329 Major depressive disorder, single episode, unspecified: Secondary | ICD-10-CM | POA: Diagnosis not present

## 2015-08-10 DIAGNOSIS — J439 Emphysema, unspecified: Secondary | ICD-10-CM

## 2015-08-10 DIAGNOSIS — I5032 Chronic diastolic (congestive) heart failure: Secondary | ICD-10-CM

## 2015-08-10 DIAGNOSIS — J309 Allergic rhinitis, unspecified: Secondary | ICD-10-CM

## 2015-08-10 DIAGNOSIS — M4712 Other spondylosis with myelopathy, cervical region: Secondary | ICD-10-CM

## 2015-08-10 DIAGNOSIS — M1990 Unspecified osteoarthritis, unspecified site: Secondary | ICD-10-CM

## 2015-08-10 DIAGNOSIS — F32A Depression, unspecified: Secondary | ICD-10-CM

## 2015-08-10 DIAGNOSIS — Z79890 Hormone replacement therapy (postmenopausal): Secondary | ICD-10-CM

## 2015-08-10 NOTE — Progress Notes (Signed)
Patient ID: Jo LaughterLinda Owens, female   DOB: 20-Oct-1955, 60 y.o.   MRN: [id number]    DATE:  08/10/15  MRN:  [id number]  BIRTHDAY: 20-Oct-1955  Facility:  Nursing Home Location:  [facility]  Nursing Home Room Number: 704-P  LEVEL OF CARE:  SNF (31)  Contact Information    Name Relation Home Work [place]   Maness,Martha Sister [phone]     Lenna SciaraHyler,James Son   [phone]       Chief Complaint  Patient presents with  . 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 She has been admitted to [facility] on 07/18/15 from [facility] . She had a second stage procedure with a posterior cervical mentation from C2-C7 posterior cervical fusion on 07/08/15.   Patient was admitted to this facility for short-term rehabilitation after the patient's recent hospitalization.  Patient has completed SNF rehabilitation and therapy has cleared the patient for discarge.   PAST MEDICAL HISTORY:  Past Medical History  Diagnosis Date  . Mood disorder (HCC)   . Arthritis   . CHF (congestive heart failure) (HCC)     takes Lasix daily  . COPD (chronic obstructive pulmonary disease) (HCC)   . Emphysema lung (HCC)   . SOB (shortness of breath)     with exertion  . Asthma     Breo and Combivent daily  . Multiple allergies     Fluticasone and Mucinex daily  . Pneumonia under the age of 60    hx of  . History of bronchitis 5-6wks ago  . Headache     daily   . Vertigo     takes Meclizine daily as needed  . Weakness     numbness and tingling in right arm and leg  . Joint pain   . Joint swelling   . GERD (gastroesophageal reflux disease)     takes Omeprazole daily  . History of blood transfusion 1983   no abnormal reaction noted  . Glaucoma     mild"  . Depression     takes Wellbutrin and Citalopram daily     CURRENT MEDICATIONS: Reviewed  Patient's Medications  New Prescriptions   No medications on file  Previous Medications   BACLOFEN (LIORESAL) 10 MG TABLET    Take 5 mg by mouth at bedtime.   BUPROPION (WELLBUTRIN SR) 100 MG 12 HR TABLET    Take 100 mg by mouth 2 (two) times daily.    CHOLECALCIFEROL (HM VITAMIN D3) 4000 UNITS CAPS    Take 4,000 Units by mouth daily.    CITALOPRAM (CELEXA) 40 MG TABLET    Take 40 mg by mouth daily.   COMBIVENT RESPIMAT 20-100 MCG/ACT AERS RESPIMAT    Inhale 2 puffs into the lungs 2 (two) times daily.    FLUTICASONE (FLONASE) 50 MCG/ACT NASAL SPRAY    Place 1 spray into both nostrils daily.   FLUTICASONE FUROATE-VILANTEROL (BREO ELLIPTA) 200-25 MCG/INH AEPB    Inhale 1 puff into the lungs daily.   FUROSEMIDE (LASIX) 20 MG TABLET    Take 1 tablet (20 mg total) by mouth daily.   GUAIFENESIN (MUCINEX) 600 MG 12 HR TABLET    Take 600 mg by mouth 2 (two) times  daily.   INDOMETHACIN (INDOCIN SR) 75 MG CR CAPSULE    Take 75 mg by mouth 2 (two) times daily.   MELATONIN 5 MG TABS    Take 5 mg by mouth at bedtime.   METHOCARBAMOL (ROBAXIN) 500 MG TABLET    Take 500 mg by mouth 4 (four) times daily.   OMEPRAZOLE (PRILOSEC) 20 MG CAPSULE    Take 1 capsule by mouth daily.   OXYCODONE-ACETAMINOPHEN (PERCOCET/ROXICET) 5-325 MG TABLET    Take 1-2 tablets by mouth every 4 (four) hours as needed for moderate pain.   OXYCODONE-ACETAMINOPHEN (PERCOCET/ROXICET) 5-325 MG TABLET    Take 1-2 tablets by mouth every 4 (four) hours as needed for moderate pain.   POLYETHYLENE GLYCOL (MIRALAX / GLYCOLAX) PACKET    Take 17 g by mouth daily.   PREMPRO 0.3-1.5 MG PER TABLET    Take 1 tablet by mouth daily.  Modified Medications   No medications on file  Discontinued Medications   METHOCARBAMOL (ROBAXIN) 750 MG TABLET    Take 0.5 tablets (375 mg total) by mouth 4 (four) times  daily.   TRAMADOL (ULTRAM) 50 MG TABLET    Take 2 tablets by mouth 2 (two) times daily as needed for moderate pain.      No Known Allergies   REVIEW OF SYSTEMS:  GENERAL: no change in appetite, no fatigue, no weight changes, no fever, chills or weakness EYES: Denies change in vision, dry eyes, eye pain, itching or discharge EARS: Denies change in hearing, ringing in ears, or earache NOSE: Denies nasal congestion or epistaxis MOUTH and THROAT: Denies oral discomfort, gingival pain or bleeding, pain from teeth or hoarseness   RESPIRATORY: no cough, SOB, DOE, wheezing, hemoptysis CARDIAC: no chest pain, edema or palpitations GI: no abdominal pain, diarrhea, constipation, heart burn, nausea or vomiting GU: Denies dysuria, frequency, hematuria, incontinence, or discharge PSYCHIATRIC: Denies feeling of depression or anxiety. No report of hallucinations, insomnia, paranoia, or agitation   PHYSICAL EXAMINATION  GENERAL APPEARANCE: Well nourished. In no acute distress. Normal body habitus SKIN:  Surgical incision @ front of right neckis healed, no erythema; surgical incision @ back of neck has steri-strips, no erythema and dry HEAD: Normal in size and contour. No evidence of trauma EYES: Lids open and close normally. No blepharitis, entropion or ectropion. PERRL. Conjunctivae are clear and sclerae are white. Lenses are without opacity EARS: Pinnae are normal. Patient hears normal voice tunes of the examiner MOUTH and THROAT: Lips are without lesions. Oral mucosa is moist and without lesions. Tongue is normal in shape, size, and color and without lesions NECK: supple, trachea midline, no neck masses, no thyroid tenderness, no thyromegaly LYMPHATICS: no LAN in the neck, no supraclavicular LAN RESPIRATORY: breathing is even & unlabored, BS CTAB CARDIAC: RRR, no murmur,no extra heart sounds, no edema GI: abdomen soft, normal BS, no masses, no tenderness, no hepatomegaly, no  splenomegaly EXTREMITIES:  Able to move X 4 extremities; has Aspen collar on neck PSYCHIATRIC: Alert and oriented X 3. Affect and behavior are appropriate  LABS/RADIOLOGY: Labs reviewed: 07/20/15   WBC 7.2 hemoglobin 11.8 hematocrit 36.3 MCV 93.1 platelet 370 sodium 141 potassium 4.4 glucose 81 BUN 18 creatinine 0.98 calcium 9.1 Basic Metabolic Panel:  Recent Labs  40/98/11 1520 07/08/15 0639  NA 138 137  K 4.1 3.7  CL 106 104  CO2 24 26  GLUCOSE 83 84  BUN 11 8  CREATININE 1.02* 0.97  CALCIUM 9.0 8.9    CBC:  Recent Labs  06/20/15 1520 07/08/15 0639  WBC 7.8 9.9  HGB 14.4 13.5  HCT 42.7 40.9  MCV 93.6 93.8  PLT 219 292    No results found.  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 Wynetta Emery, neuro surgery  COPD - stable; continue Fluticasone-Furoate-Vilanterol inhale 1 puff daily and iprtropium-albuterol inhale 2 puffs into the lungs BID  Depression - mood is stable; continue Buproprion 100mg  12 hr 1 tab BID and Celexa 40 mg daily  Arthritis - continue Indomethacin 75 mg CR 1 capsule BID  GERD - continue Prilosec 20 mg daily  HRT - continue Prempro 0.3-1.5 mg 1 tab daily  Chronic diastolic CHF - continue Lasix 20 mg daily  Allergic rhinitis - continue Flonase 50 mcg/ACT 1 spray into each nostrils daily     I have filled out patient's discharge paperwork and written prescriptions.  Patient will receive home health PT, OT and CNA.  Total discharge time: Greater than 30 minutes  Discharge time involved coordination of the discharge process with social worker, nursing staff and therapy department. Medical justification for home health services verified.     [facility], NP [employer] [phone]

## 2015-08-11 DIAGNOSIS — F329 Major depressive disorder, single episode, unspecified: Secondary | ICD-10-CM | POA: Diagnosis not present

## 2015-08-11 DIAGNOSIS — M4322 Fusion of spine, cervical region: Secondary | ICD-10-CM

## 2015-08-11 DIAGNOSIS — Z981 Arthrodesis status: Secondary | ICD-10-CM | POA: Diagnosis not present

## 2015-08-11 DIAGNOSIS — I5032 Chronic diastolic (congestive) heart failure: Secondary | ICD-10-CM | POA: Diagnosis not present

## 2015-08-11 DIAGNOSIS — M4712 Other spondylosis with myelopathy, cervical region: Secondary | ICD-10-CM | POA: Diagnosis not present

## 2015-08-11 DIAGNOSIS — J449 Chronic obstructive pulmonary disease, unspecified: Secondary | ICD-10-CM | POA: Diagnosis not present

## 2015-10-13 ENCOUNTER — Ambulatory Visit (INDEPENDENT_AMBULATORY_CARE_PROVIDER_SITE_OTHER): Payer: BLUE CROSS/BLUE SHIELD | Admitting: Internal Medicine

## 2015-10-13 ENCOUNTER — Encounter: Payer: Self-pay | Admitting: Internal Medicine

## 2015-10-13 VITALS — BP 110/78 | HR 70 | Ht 63.0 in | Wt 157.4 lb

## 2015-10-13 DIAGNOSIS — J449 Chronic obstructive pulmonary disease, unspecified: Secondary | ICD-10-CM | POA: Diagnosis not present

## 2015-10-13 MED ORDER — BUDESONIDE-FORMOTEROL FUMARATE 160-4.5 MCG/ACT IN AERO: INHALATION_SPRAY | RESPIRATORY_TRACT | Status: DC

## 2015-10-13 MED ORDER — MOMETASONE FUROATE 50 MCG/ACT NA SUSP: 2.0000 | Freq: Every day | NASAL | Status: DC

## 2015-10-13 NOTE — Progress Notes (Signed)
Subjective:     Patient ID: [name], female   DOB: 19-Apr-1956,     MRN: [id number]  HPI  [age] yowf quit smoking 1999 with cough that resolved and eval by Dr Delford FieldWright in [place] with GOLD II copd     10/13/2015 1st  office visit/ [name]  rx  Combivenet/ no saba  Chief Complaint  Patient presents with  . Follow-up    Former patient of Dr Delford FieldWright. She c/o increased SOB and cough x 2 wks. She states that she is SOB with or without any exertion. She does not have a rescue inhaler.   not using 02 at all since 1st Feb 2016  [facility] = can't walk a nl pace on a flat grade s sob More cough at hs esp x 2 weeks   No obvious day to day or daytime variability or assoc excess/ purulent sputum or mucus plugs or hemoptysis or cp or chest tightness, subjective wheeze or overt sinus or hb symptoms. No unusual exp hx or h/o childhood pna/ asthma or knowledge of premature birth.   Also denies any obvious fluctuation of symptoms with weather or environmental changes or other aggravating or alleviating factors except as outlined above   Current Medications, Allergies, Complete Past Medical History, Past Surgical History, Family History, and Social History were reviewed in [facility] electronic medical record.  ROS  The following are not active complaints unless bolded sore throat, dysphagia, dental problems, itching, sneezing,  nasal congestion or excess/ purulent secretions, ear ache,   fever, chills, sweats, unintended wt loss, classically pleuritic or exertional cp,  orthopnea pnd or leg swelling, presyncope, palpitations, abdominal pain, anorexia, nausea, vomiting, diarrhea  or change in bowel or bladder habits, change in stools or urine, dysuria,hematuria,  rash, arthralgias, visual complaints, headache, numbness, weakness or ataxia or problems with walking or coordination,  change in mood/affect or memory.              Review of Systems     Objective:   Physical Exam    Hoarse amb wf  nad  Wt Readings from Last 3 Encounters:  10/13/15 157 lb 6.4 oz (71.396 kg)  08/10/15 157 lb 6.4 oz (71.396 kg)  07/21/15 156 lb 6.4 oz (70.943 kg)    Vital signs reviewed   HEENT: nl dentition, turbinates, and oropharynx. Nl external ear canals without cough reflex   NECK :  without JVD/Nodes/TM/ nl carotid upstrokes bilaterally   LUNGS: no acc muscle use,  Nl contour chest  With late exp bilateral sonorous rhonchi   CV:  RRR  no s3 or murmur or increase in P2, no edema   ABD:  soft and nontender with nl inspiratory excursion in the supine position. No bruits or organomegaly, bowel sounds nl  MS:  Nl gait/ ext warm without deformities, calf tenderness, cyanosis or clubbing No obvious joint restrictions   SKIN: warm and dry without lesions    NEURO:  alert, approp, nl sensorium with  no motor deficits        Assessment:

## 2015-10-13 NOTE — Patient Instructions (Addendum)
Plan A = Automatic = Symbicort 160 Take 2 puffs first thing in am and then another 2 puffs about 12 hours later.   Work on inhaler technique:  relax and gently blow all the way out then take a nice smooth deep breath back in, triggering the inhaler at same time you start breathing in.  Hold for up to 5 seconds if you can. Blow out thru nose. Rinse and gargle with water when done    Plan B = Backup Only use your combivent as a rescue medication to be used if you can't catch your breath by resting or doing a relaxed purse lip breathing pattern.  - The less you use it, the better it will work when you need it. - Ok to use up to 1 puffs  every 4 hours if you must but call for appointment if use goes up over your usual need - Don't leave home without it !!  (think of it like the spare tire for your car)    Please schedule a follow up visit in 3 months but call sooner if needed pfts on return

## 2015-10-17 ENCOUNTER — Encounter: Payer: Self-pay | Admitting: Internal Medicine

## 2015-10-17 DIAGNOSIS — J449 Chronic obstructive pulmonary disease, unspecified: Secondary | ICD-10-CM | POA: Insufficient documentation

## 2015-10-17 NOTE — Assessment & Plan Note (Addendum)
DDX of  difficult airways management almost all start with A and  include Adherence, Ace Inhibitors, Acid Reflux, Active Sinus Disease, Alpha 1 Antitripsin deficiency, Anxiety masquerading as Airways dz,  ABPA,  Allergy(esp in young), Aspiration (esp in elderly), Adverse effects of meds,  Active smokers, A bunch of PE's (a small clot burden can't cause this syndrome unless there is already severe underlying pulm or vascular dz with poor reserve) plus two Bs  = Bronchiectasis and Beta blocker use..and one C= CHF   Adherence is always the initial "prime suspect" and is a multilayered concern that requires a "trust but verify" approach in every patient - starting with knowing how to use medications, especially inhalers, correctly, keeping up with refills and understanding the fundamental difference between maintenance and prns vs those medications only taken for a very short course and then stopped and not refilled.  - The proper method of use, as well as anticipated side effects, of a metered-dose inhaler are discussed and demonstrated to the patient. Improved effectiveness after extensive coaching during this visit to a level of approximately 75 % from a baseline of 50 % > try symbicort 160 2bid then return for pfts - use combivent as the prn once starts symbicort   ? Acid (or non-acid) GERD > always difficult to exclude as up to 75% of pts in some series report no assoc GI/ Heartburn symptoms> rec continue max (24h)  acid suppression and diet restrictions/ reviewed     ? Anxiety > usually at the bottom of this list of usual suspects but should be much higher on this pt's based on H and P and note already on psychotropics > Follow up per Primary Care planned    I had an extended discussion with the patient reviewing all relevant studies completed to date and  lasting 25  minutes of a 40 minute transition of care office  visit    Each maintenance medication was reviewed in detail including most importantly  the difference between maintenance and prns and under what circumstances the prns are to be triggered using an action plan format that is not reflected in the computer generated alphabetically organized AVS.    Please see instructions for details which were reviewed in writing and the patient given a copy highlighting the part that I personally wrote and discussed at today's ov.

## 2015-10-18 ENCOUNTER — Ambulatory Visit: Payer: 59 | Admitting: Internal Medicine

## 2015-11-29 ENCOUNTER — Other Ambulatory Visit: Payer: Self-pay | Admitting: Adult Health

## 2015-12-26 ENCOUNTER — Other Ambulatory Visit: Payer: Self-pay | Admitting: Adult Health

## 2016-01-13 ENCOUNTER — Ambulatory Visit: Payer: BLUE CROSS/BLUE SHIELD | Admitting: Internal Medicine

## 2016-01-24 ENCOUNTER — Other Ambulatory Visit: Payer: Self-pay | Admitting: Internal Medicine

## 2016-01-24 DIAGNOSIS — R0600 Dyspnea, unspecified: Secondary | ICD-10-CM

## 2016-01-25 ENCOUNTER — Encounter: Payer: Self-pay | Admitting: Internal Medicine

## 2016-01-25 ENCOUNTER — Ambulatory Visit (INDEPENDENT_AMBULATORY_CARE_PROVIDER_SITE_OTHER): Payer: BLUE CROSS/BLUE SHIELD | Admitting: Internal Medicine

## 2016-01-25 ENCOUNTER — Other Ambulatory Visit (INDEPENDENT_AMBULATORY_CARE_PROVIDER_SITE_OTHER): Payer: BLUE CROSS/BLUE SHIELD

## 2016-01-25 VITALS — BP 120/80 | HR 96 | Ht 62.0 in | Wt 161.0 lb

## 2016-01-25 DIAGNOSIS — J449 Chronic obstructive pulmonary disease, unspecified: Secondary | ICD-10-CM | POA: Diagnosis not present

## 2016-01-25 DIAGNOSIS — R0600 Dyspnea, unspecified: Secondary | ICD-10-CM

## 2016-01-25 LAB — PULMONARY FUNCTION TEST
DL/VA % pred: 65 %
DL/VA: 2.96 ml/min/mmHg/L
DLCO COR: 15.27 ml/min/mmHg
DLCO UNC % PRED: 72 %
DLCO UNC: 15.63 ml/min/mmHg
DLCO cor % pred: 70 %
FEF 25-75 POST: 1.23 L/s
FEF 25-75 PRE: 1 L/s
FEF2575-%Change-Post: 23 %
FEF2575-%PRED-POST: 52 %
FEF2575-%PRED-PRE: 42 %
FEV1-%Change-Post: 7 %
FEV1-%PRED-POST: 87 %
FEV1-%Pred-Pre: 81 %
FEV1-PRE: 1.98 L
FEV1-Post: 2.13 L
FEV1FVC-%CHANGE-POST: 3 %
FEV1FVC-%PRED-PRE: 76 %
FEV6-%CHANGE-POST: 6 %
FEV6-%PRED-POST: 111 %
FEV6-%Pred-Pre: 105 %
FEV6-Post: 3.38 L
FEV6-Pre: 3.18 L
FEV6FVC-%CHANGE-POST: 1 %
FEV6FVC-%PRED-PRE: 101 %
FEV6FVC-%Pred-Post: 103 %
FVC-%Change-Post: 4 %
FVC-%Pred-Post: 108 %
FVC-%Pred-Pre: 104 %
FVC-Post: 3.41 L
FVC-Pre: 3.28 L
POST FEV1/FVC RATIO: 63 %
PRE FEV1/FVC RATIO: 60 %
Post FEV6/FVC ratio: 99 %
Pre FEV6/FVC Ratio: 97 %
RV % PRED: 102 %
RV: 1.89 L
TLC % pred: 116 %
TLC: 5.54 L

## 2016-01-25 LAB — CBC WITH DIFFERENTIAL/PLATELET
Basophils Absolute: 0 10*3/uL (ref 0.0–0.1)
Basophils Relative: 0.4 % (ref 0.0–3.0)
EOS PCT: 1.8 % (ref 0.0–5.0)
Eosinophils Absolute: 0.2 10*3/uL (ref 0.0–0.7)
HCT: 45.7 % (ref 36.0–46.0)
Hemoglobin: 15.2 g/dL — ABNORMAL HIGH (ref 12.0–15.0)
LYMPHS ABS: 4 10*3/uL (ref 0.7–4.0)
Lymphocytes Relative: 38.5 % (ref 12.0–46.0)
MCHC: 33.2 g/dL (ref 30.0–36.0)
MCV: 91.4 fl (ref 78.0–100.0)
MONOS PCT: 9.1 % (ref 3.0–12.0)
Monocytes Absolute: 1 10*3/uL (ref 0.1–1.0)
NEUTROS ABS: 5.3 10*3/uL (ref 1.4–7.7)
NEUTROS PCT: 50.2 % (ref 43.0–77.0)
PLATELETS: 277 10*3/uL (ref 150.0–400.0)
RBC: 5.01 Mil/uL (ref 3.87–5.11)
RDW: 13.5 % (ref 11.5–15.5)
WBC: 10.5 10*3/uL (ref 4.0–10.5)

## 2016-01-25 MED ORDER — TIOTROPIUM BROMIDE-OLODATEROL 2.5-2.5 MCG/ACT IN AERS
2.0000 | INHALATION_SPRAY | Freq: Every day | RESPIRATORY_TRACT | Status: DC
Start: 2016-01-25 — End: 2016-03-09

## 2016-01-25 MED ORDER — IPRATROPIUM-ALBUTEROL 20-100 MCG/ACT IN AERS: INHALATION_SPRAY | RESPIRATORY_TRACT | Status: DC

## 2016-01-25 NOTE — Progress Notes (Signed)
PFT done today. 

## 2016-01-25 NOTE — Patient Instructions (Addendum)
Plan A = Automatic = Stiolto 2 puffs each am instead of Breo   Plan B = Backup Only use your combivent as a rescue medication to be used if you can't catch your breath by resting or doing a relaxed purse lip breathing pattern.  - The less you use it, the better it will work when you need it. - Ok to use the inhaler up to 1 puffs  every 4 hours if you must but call for appointment if use goes up over your usual need - Don't leave home without it !!  (think of it like the spare tire for your car)   Please remember to go to the lab  department downstairs for your tests - we will call you with the results when they are available.     Please schedule a follow up visit in 3 months but call sooner if needed

## 2016-01-25 NOTE — Progress Notes (Signed)
Subjective:     Patient ID: Jo LaughterLinda Pascuzzi, female   DOB: [birth year],     MRN: [id number]    Brief patient profile:  [age] yowf quit smoking 1999 with cough that resolved and eval by Dr Delford FieldWright in [place] with GOLD II copd       History of Present Illness  10/13/2015 1st  office visit/ [facility]  rx  Combivent/ no saba  Chief Complaint  Patient presents with  . Follow-up    Former patient of Dr Delford FieldWright. She c/o increased SOB and cough x 2 wks. She states that she is SOB with or without any exertion. She does not have a rescue inhaler.   not using 02 at all since 1st Feb 2017  [facility] = can't walk a nl pace on a flat grade s sob More cough at hs esp x 2 weeks  rec Plan A = Automatic = Symbicort 160 Take 2 puffs first thing in am and then another 2 puffs about 12 hours later.  Plan B = Backup Only use your combivent as a rescue medication    01/25/2016  f/u ov/Wert re: GOLD I copd / breo 200 and combivent x 2 pffs  Chief Complaint  Patient presents with  . Follow-up    Pt states her breathing is about the same. She has had minimal cough- prod with clear sputum.    doe = MMRC1 = can walk nl pace, flat grade, can't hurry or go uphills or steps s sob   Wants to know if allergic to cats, considering buying one  No noct cough/ no copd flares since last ov    No obvious day to day or daytime variability or assoc excess/ purulent sputum or mucus plugs or hemoptysis or cp or chest tightness, subjective wheeze or overt sinus or hb symptoms. No unusual exp hx or h/o childhood pna/ asthma or knowledge of premature birth.   Also denies any obvious fluctuation of symptoms with weather or environmental changes or other aggravating or alleviating factors except as outlined above   Current Medications, Allergies, Complete Past Medical History, Past Surgical History, Family History, and Social History were reviewed in [facility] electronic medical record.  ROS  The following are not active complaints  unless bolded sore throat, dysphagia, dental problems, itching, sneezing,  nasal congestion or excess/ purulent secretions, ear ache,   fever, chills, sweats, unintended wt loss, classically pleuritic or exertional cp,  orthopnea pnd or leg swelling, presyncope, palpitations, abdominal pain, anorexia, nausea, vomiting, diarrhea  or change in bowel or bladder habits, change in stools or urine, dysuria,hematuria,  rash, arthralgias, visual complaints, headache, numbness, weakness or ataxia or problems with walking or coordination,  change in mood/affect or memory.                   Objective:   Physical Exam    Hoarse amb wf nad  Wt Readings from Last 3 Encounters:  10/13/15 157 lb 6.4 oz (71.396 kg)  08/10/15 157 lb 6.4 oz (71.396 kg)  07/21/15 156 lb 6.4 oz (70.943 kg)    Vital signs reviewed   HEENT: nl dentition, turbinates, and oropharynx. Nl external ear canals without cough reflex   NECK :  without JVD/Nodes/TM/ nl carotid upstrokes bilaterally   LUNGS: no acc muscle use,  Nl contour chest  With late exp bilateral sonorous rhonchi   CV:  RRR  no s3 or murmur or increase in P2, no edema   ABD:  soft and nontender with nl inspiratory excursion in the supine position. No bruits or organomegaly, bowel sounds nl  MS:  Nl gait/ ext warm without deformities, calf tenderness, cyanosis or clubbing No obvious joint restrictions   SKIN: warm and dry without lesions    NEURO:  alert, approp, nl sensorium with  no motor deficits    Labs 01/25/2016 cbc with diff/ allergy profile      Assessment:

## 2016-01-26 ENCOUNTER — Encounter: Payer: Self-pay | Admitting: Internal Medicine

## 2016-01-26 DIAGNOSIS — Z889 Allergy status to unspecified drugs, medicaments and biological substances status: Secondary | ICD-10-CM | POA: Insufficient documentation

## 2016-01-26 LAB — RESPIRATORY ALLERGY PROFILE REGION II ~~LOC~~
ALLERGEN, COMM SILVER BIRCH, T3: 2.96 kU/L — AB
ALLERGEN, MULBERRY, T70: 2.65 kU/L — AB
Allergen, Cedar tree, t12: 2.8 kU/L — ABNORMAL HIGH
Allergen, Cottonwood, t14: 3.23 kU/L — ABNORMAL HIGH
Allergen, D pternoyssinus,d7: 0.13 kU/L — ABNORMAL HIGH
Allergen, Mouse Urine Protein, e78: <0.1 kU/L
Allergen, Oak,t7: 3.19 kU/L — ABNORMAL HIGH
Aspergillus fumigatus, m3: <0.1 kU/L
BOX ELDER: 3.29 kU/L — AB
Bermuda Grass: 6.41 kU/L — ABNORMAL HIGH
Cat Dander: 2.92 kU/L — ABNORMAL HIGH
Cockroach: 4.98 kU/L — ABNORMAL HIGH
Common Ragweed: 3.24 kU/L — ABNORMAL HIGH
D. FARINAE: 0.38 kU/L — AB
Dog Dander: 0.56 kU/L — ABNORMAL HIGH
Elm IgE: 3.67 kU/L — ABNORMAL HIGH
IGE (IMMUNOGLOBULIN E), SERUM: 192 kU/L — AB (ref <115)
JOHNSON GRASS: 6.87 kU/L — AB
Pecan/Hickory Tree IgE: 19.8 kU/L — ABNORMAL HIGH
ROUGH PIGWEED IGE: 3.01 kU/L — AB
SHEEP SORREL IGE: 3.45 kU/L — AB
TIMOTHY GRASS: 32.6 kU/L — AB

## 2016-01-26 NOTE — Progress Notes (Signed)
Quick Note:  Spoke with pt and notified of results per Dr. Wert. Pt verbalized understanding and denied any questions.  ______ 

## 2016-01-26 NOTE — Assessment & Plan Note (Signed)
-    PFT's  08/26/13   FEV1 175 (70 % ) ratio 59  p 10 % improvement from saba   with DLCO  57 % corrects to 54 % for alv volume   10/13/2015  try symbiicort 160 2bid - PFT's  01/25/2016  FEV1 2.13(87 % ) ratio 63  p 7 % improvement from saba p breo/combivent prior to study with DLCO  72/70 % corrects to 65 % for alv volume   - 01/25/2016  After extensive coaching HFA effectiveness =    90% with respimat > changed to stiolto   Clearly improved but still using combivent at least 4 x daily so makes more sense to change breo/combivent to stiolto 2 each am and see if can reduce combivent to rare use then change to saba as rescue if this works  I had an extended discussion with the patient reviewing all relevant studies completed to date and  lasting 15 to 20 minutes of a 25 minute visit    Each maintenance medication was reviewed in detail including most importantly the difference between maintenance and prns and under what circumstances the prns are to be triggered using an action plan format that is not reflected in the computer generated alphabetically organized AVS.    Please see instructions for details which were reviewed in writing and the patient given a copy highlighting the part that I personally wrote and discussed at today's ov.

## 2016-02-20 ENCOUNTER — Other Ambulatory Visit: Payer: Self-pay | Admitting: Adult Health

## 2016-02-28 ENCOUNTER — Other Ambulatory Visit: Payer: Self-pay | Admitting: Adult Health

## 2016-03-01 ENCOUNTER — Telehealth: Payer: Self-pay | Admitting: Internal Medicine

## 2016-03-01 NOTE — Telephone Encounter (Signed)
Called and spoke with [facility] and they stated that they never received the fax for the refills of combivent on 01/25/16.  Gave VO to pharmacy. Nothing further is needed.

## 2016-03-02 ENCOUNTER — Other Ambulatory Visit: Payer: Self-pay | Admitting: Internal Medicine

## 2016-03-02 MED ORDER — IPRATROPIUM-ALBUTEROL 20-100 MCG/ACT IN AERS: INHALATION_SPRAY | RESPIRATORY_TRACT | 11 refills | Status: DC

## 2016-03-09 ENCOUNTER — Telehealth: Payer: Self-pay | Admitting: Internal Medicine

## 2016-03-09 MED ORDER — FLUTICASONE FUROATE-VILANTEROL 100-25 MCG/INH IN AEPB: 1.0000 | INHALATION_SPRAY | Freq: Every day | RESPIRATORY_TRACT | 3 refills | Status: DC

## 2016-03-09 NOTE — Telephone Encounter (Signed)
Called and spoke with pt and she is aware of MW recs.  breo has been sent to her pharmacy and nothing further is needed.

## 2016-03-09 NOTE — Telephone Encounter (Signed)
Fine to resume breo for now and see if problem resolves but it is normal with exercise to see the pulse up to 135, just not after resting x 5 min

## 2016-03-09 NOTE — Telephone Encounter (Signed)
Called and spoke with pt and she stated that she was seen a couple of weeks ago and changed from breo to SYSCOstiolto.  She stated that she does the pulmonary rehab 3 times per week and since starting on the stiolto her HR is all over the place.  She stated that at rehab the other day they said it went from 100 to 135.  She thinks that this is the cause of this.  Wanting recs from MW.  Please advise. Thanks  No Known Allergies

## 2016-04-09 ENCOUNTER — Telehealth: Payer: Self-pay | Admitting: Internal Medicine

## 2016-04-09 NOTE — Telephone Encounter (Signed)
LMTCB x1 for pt.  

## 2016-04-10 NOTE — Telephone Encounter (Signed)
Pt was calling to check on her disability paperwork that had been sent from [place]. Pt states that they sent it about 3 weeks ago and have told her that it needs to be returned by the 16th.   Spoke with [employer] and they have processed paperwork and are waiting on pt to pay fee. They have mailed bill to pt.   Advised pt of fee due for paperwork completion. She will pay as soon as she receives bill.   Nothing further needed.

## 2016-04-10 NOTE — Telephone Encounter (Signed)
Pt returning call.[name] ° °

## 2016-04-25 ENCOUNTER — Ambulatory Visit (INDEPENDENT_AMBULATORY_CARE_PROVIDER_SITE_OTHER): Payer: BLUE CROSS/BLUE SHIELD | Admitting: Internal Medicine

## 2016-04-25 ENCOUNTER — Encounter: Payer: Self-pay | Admitting: Internal Medicine

## 2016-04-25 VITALS — BP 126/82 | HR 105 | Ht 62.0 in | Wt 167.8 lb

## 2016-04-25 DIAGNOSIS — J449 Chronic obstructive pulmonary disease, unspecified: Secondary | ICD-10-CM | POA: Diagnosis not present

## 2016-04-25 DIAGNOSIS — Z23 Encounter for immunization: Secondary | ICD-10-CM

## 2016-04-25 DIAGNOSIS — Z889 Allergy status to unspecified drugs, medicaments and biological substances status: Secondary | ICD-10-CM

## 2016-04-25 MED ORDER — FLUTICASONE FUROATE-VILANTEROL 100-25 MCG/INH IN AEPB: 1.0000 | INHALATION_SPRAY | Freq: Every morning | RESPIRATORY_TRACT | 11 refills | Status: DC

## 2016-04-25 NOTE — Patient Instructions (Signed)
Plan A = Automatic = Breo  ONE CLICK each am x 2 drags   Plan B = Backup Only use your combivent s a rescue medication to be used if you can't catch your breath by resting or doing a relaxed purse lip breathing pattern.  - The less you use it, the better it will work when you need it. - Ok to use the inhaler up to 1 puffs  every 4 hours if you must but call for appointment if use goes up over your usual need - Don't leave home without it !!  (think of it like the spare tire for your car)     If you are satisfied with your treatment plan,  let your doctor know and he/she can either refill your medications or you can return here when your prescription runs out.     If in any way you are not 100% satisfied,  please tell us.  If 100% better, tell your friends!  Pulmonary follow up is as needed

## 2016-04-25 NOTE — Assessment & Plan Note (Signed)
-    PFT's  08/26/13   FEV1 175 (70 % ) ratio 59  p 10 % improvement from saba   with DLCO  57 % corrects to 54 % for alv volume   10/13/2015  try symbiicort 160 2bid - PFT's  01/25/2016  FEV1 2.13(87 % ) ratio 63  p 7 % improvement from saba p breo/combivent prior to study with DLCO  72/70 % corrects to 65 % for alv volume   - 01/25/2016    changed to stiolto from breo > worse so back to breo but changed to 200 from 100   - 04/25/2016  After extensive coaching HFA effectiveness =    90% > changed to breo 100 from 200 and use combivent prn     Really doing quite well except for wt gain ? Related to using the 200 strength so rec try the 100 Breo which has less adverse effects on the upper airway also  I had an extended final summary discussion with the patient reviewing all relevant studies completed to date and  lasting 15 to 20 minutes of a 25 minute visit on the following issues:    1)   I reviewed the Fletcher curve with the patient that basically indicates  if you quit smoking when your best day FEV1 is still well preserved (as is clearly  the case here)  it is highly unlikely you will progress to severe disease and informed the patient there was  no medication on the market that has proven to alter the curve/ its downward trajectory  or the likelihood of progression of their disease(unlike other chronic medical conditions such as atheroclerosis where we do think we can change the natural hx with risk reducing meds)    Therefore stopping smoking and maintaining abstinence is the most important aspect of care, not choice of inhalers or for that matter, doctors.    2) Each maintenance medication was reviewed in detail including most importantly the difference between maintenance and as needed and under what circumstances the prns are to be used.  Please see instructions for details which were reviewed in writing and the patient given a copy.   3) pulmonary f/u is  Prn   .

## 2016-04-25 NOTE — Progress Notes (Signed)
Subjective:     Patient ID: [name], female   DOB: [birth year],     MRN: [id number]    Brief patient profile:  [age] yowf quit smoking 1999 with cough that resolved and eval by [place]elford [place] in [place] with GOLD II copd       History of Present Illness  10/13/2015 1st  office visit/ Wert  rx  Combivent/ no saba  Chief Complaint  Patient presents with  . Follow-up    Former patient of Dr Delford FieldWright. She c/o increased SOB and cough x 2 wks. She states that she is SOB with or without any exertion. She does not have a rescue inhaler.   not using 02 at all since 1st Feb 2017  [facility] x [facility] = can't walk a nl pace on a flat grade s sob More cough at hs esp x 2 weeks  rec Plan A = Automatic = Symbicort 160 Take 2 puffs first thing in am and then another 2 puffs about 12 hours later.  Plan B = Backup Only use your combivent as a rescue medication    01/25/2016  f/u ov/Wert re: GOLD I copd / breo 200 and combivent x 2 pffs  Chief Complaint  Patient presents with  . Follow-up    Pt states her breathing is about the same. She has had minimal cough- prod with clear sputum.    doe = MMRC1 = can walk nl pace, flat grade, can't hurry or go uphills or steps s sob   Wants to know if allergic to cats, considering buying one  No noct cough/ no copd flares since last ov  rec Plan A = Automatic = Stiolto 2 puffs each am instead of Breo  Plan B = Backup Only use your combivent as a rescue medication  Please remember to go to the lab  department downstairs for your tests - we will call you with the results when they are available.     04/25/2016  f/u ov/Wert re: GOLD I  On breo 200 and prn combivent Chief Complaint  Patient presents with  . Follow-up    Breathing was worse with stiolto, so started back on breo. She had been doing better until a few days ago, started having increased SOB and sneezing- relates to allergies. She is using combivent 2 x per day on average.   confused re approp use of prns and on  breo 200 now   No obvious day to day or daytime variability or assoc excess/ purulent sputum or mucus plugs or hemoptysis or cp or chest tightness, subjective wheeze or overt sinus or hb symptoms. No unusual exp hx or h/o childhood pna/ asthma or knowledge of premature birth.   Also denies any obvious fluctuation of symptoms with weather or environmental changes or other aggravating or alleviating factors except as outlined above   Current Medications, Allergies, Complete Past Medical History, Past Surgical History, Family History, and Social History were reviewed in [facility] electronic medical record.  ROS  The following are not active complaints unless bolded sore throat, dysphagia, dental problems, itching, sneezing,  nasal congestion or excess/ purulent secretions, ear ache,   fever, chills, sweats, unintended wt loss, classically pleuritic or exertional cp,  orthopnea pnd or leg swelling, presyncope, palpitations, abdominal pain, anorexia, nausea, vomiting, diarrhea  or change in bowel or bladder habits, change in stools or urine, dysuria,hematuria,  rash, arthralgias, visual complaints, headache, numbness, weakness or ataxia or problems with walking or coordination,  change  in mood/affect or memory.                   Objective:   Physical Exam    Hoarse amb wf nad  04/25/2016         168   10/13/15 157 lb 6.4 oz (71.396 kg)  08/10/15 157 lb 6.4 oz (71.396 kg)  07/21/15 156 lb 6.4 oz (70.943 kg)    Vital signs reviewed  - 92% RA on arrival   HEENT: nl dentition, turbinates, and oropharynx. Nl external ear canals without cough reflex   NECK :  without JVD/Nodes/TM/ nl carotid upstrokes bilaterally   LUNGS: no acc muscle use,  Nl contour chest  - clear to A and P    CV:  RRR  no s3 or murmur or increase in P2, no edema   ABD:  soft and nontender with nl inspiratory excursion in the supine position. No bruits or organomegaly, bowel sounds nl  MS:  Nl gait/ ext warm  without deformities, calf tenderness, cyanosis or clubbing No obvious joint restrictions   SKIN: warm and dry without lesions    NEURO:  alert, approp, nl sensorium with  no motor deficits         Assessment:

## 2016-04-25 NOTE — Assessment & Plan Note (Signed)
Allergy profile  01/25/16  >  Eos 0.2/  IgE  192  POS RAST severe allergies cat > dog/ grass/trees/ ragweed   May have an asthmatic component but the Breo 100 should be enough ICS, if not ok to double the strength back to 200 but warned not to use 100 x 2

## 2016-06-10 IMAGING — RF DG C-ARM 61-120 MIN
1 series · 5 of 5 positions shown · non-contrast
Comparison: 07/05/2015

CLINICAL DATA: Cervical fusion.

EXAM:
DG C-ARM 61-120 MIN; CERVICAL SPINE - 2-3 VIEW

[Series 1: run · 5 of 5 slices shown]
[im 1/5]
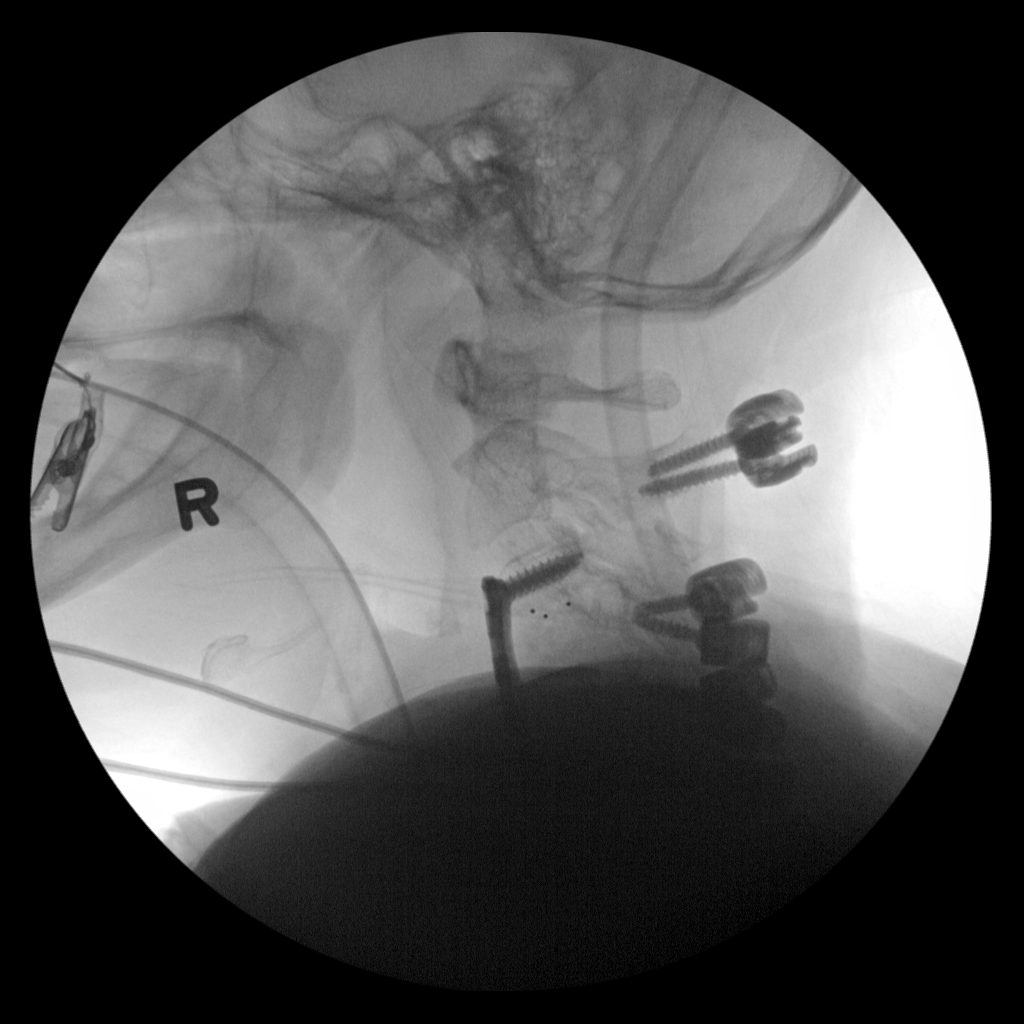
[im 2/5]
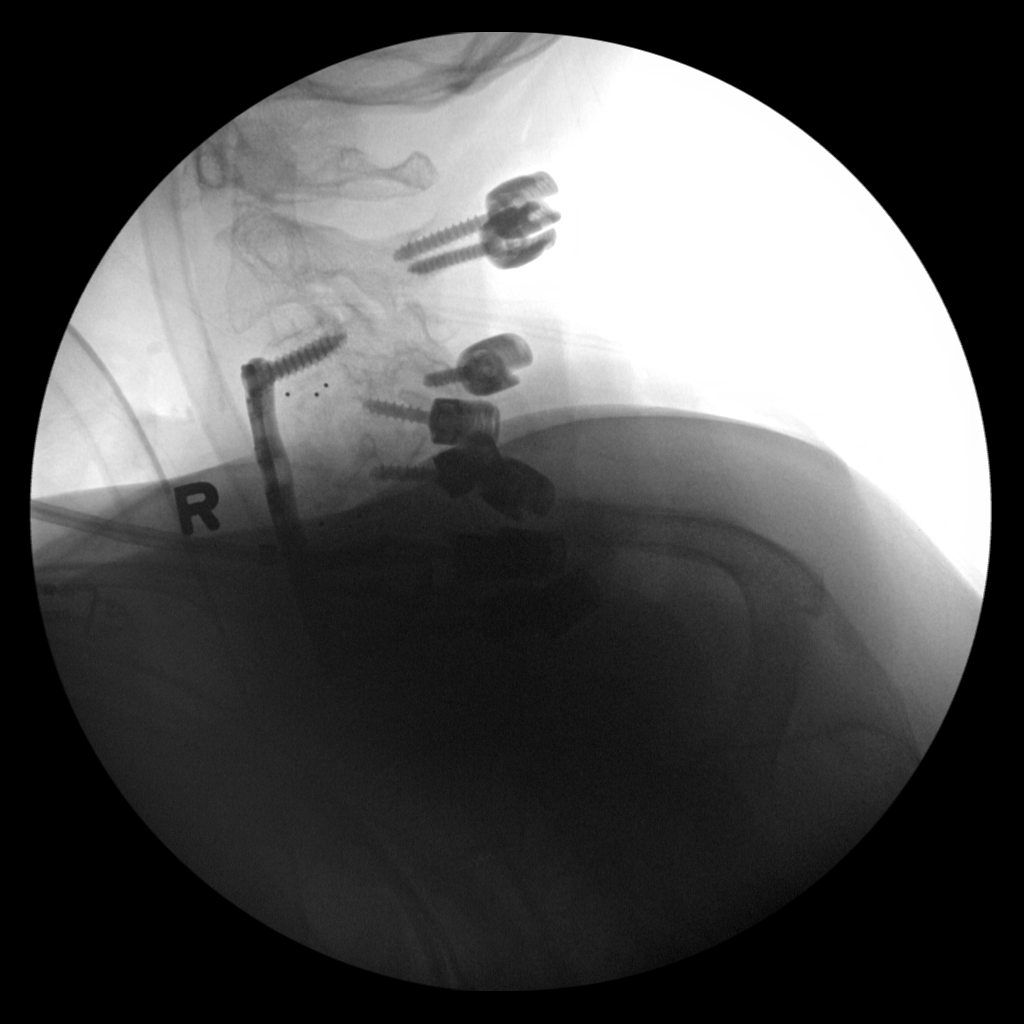
[im 3/5]
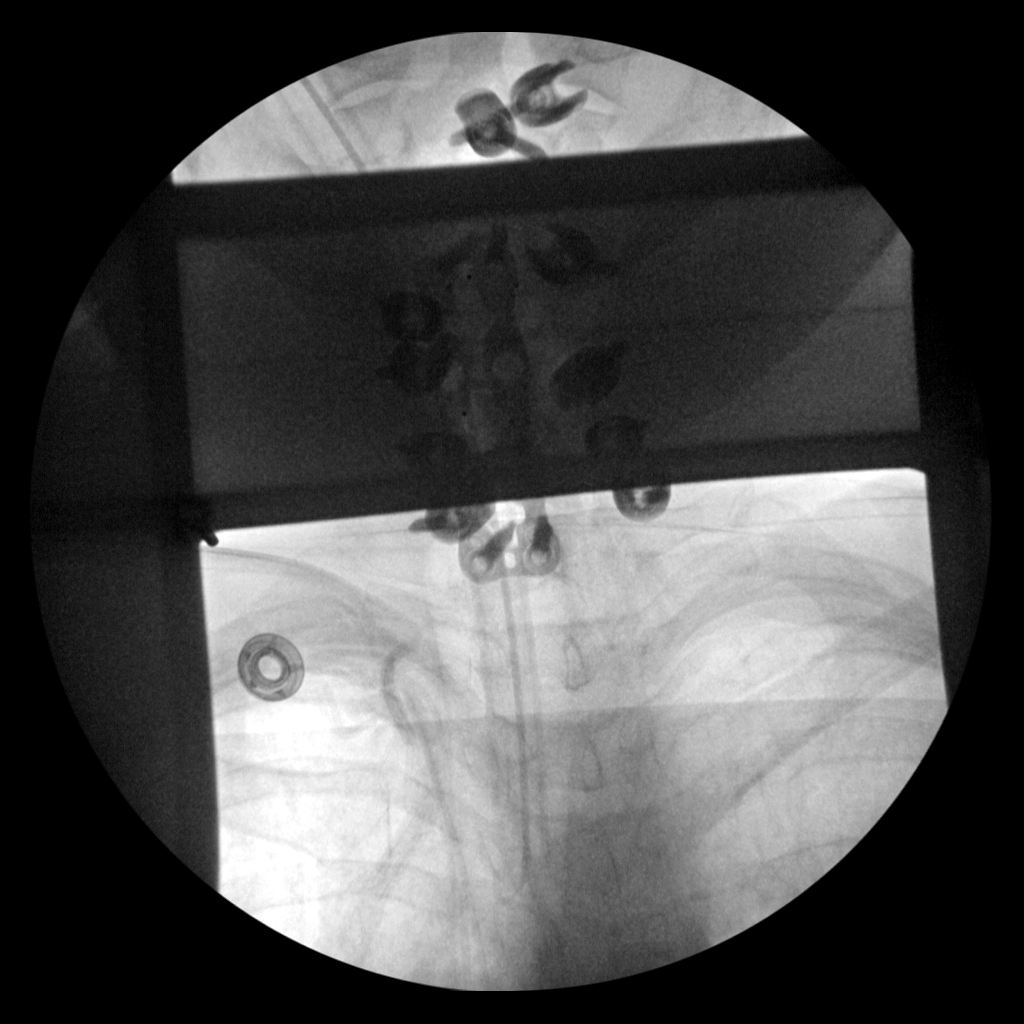
[im 4/5]
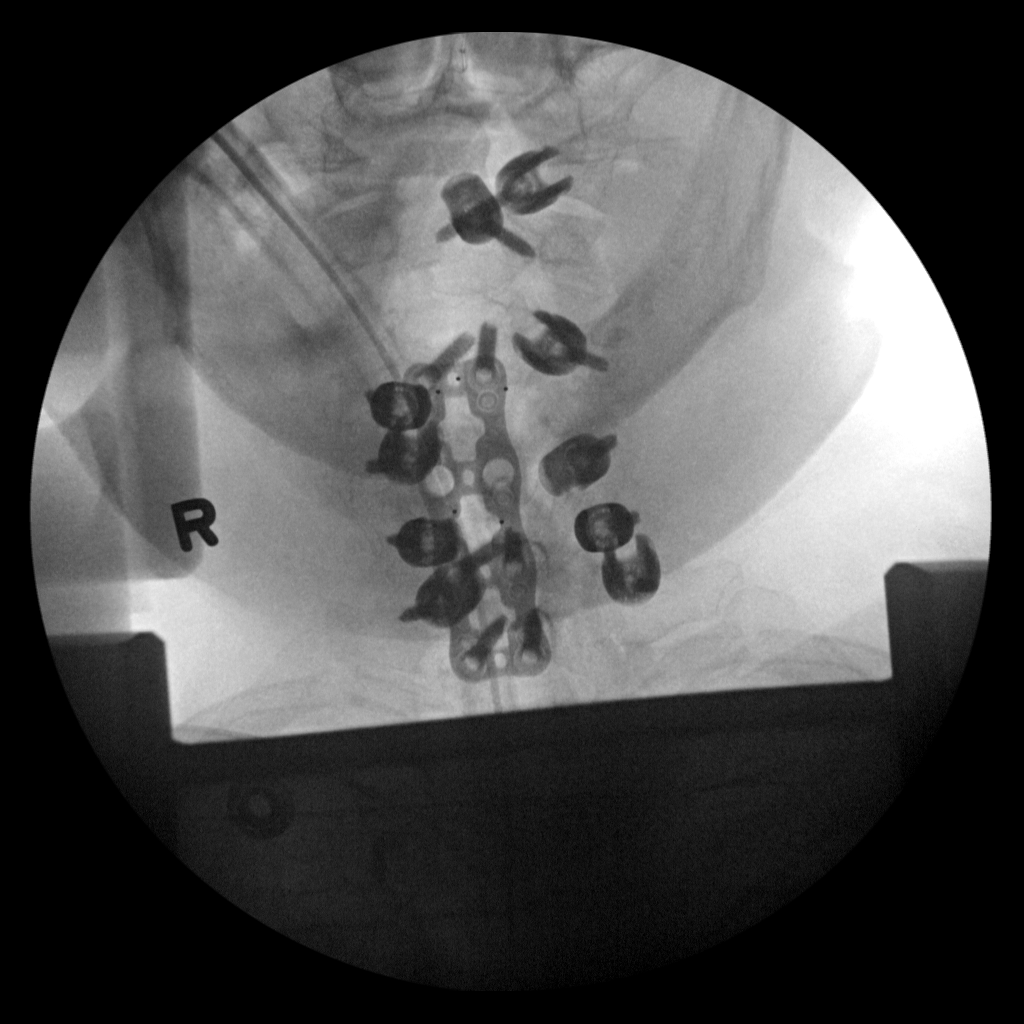
[im 5/5]
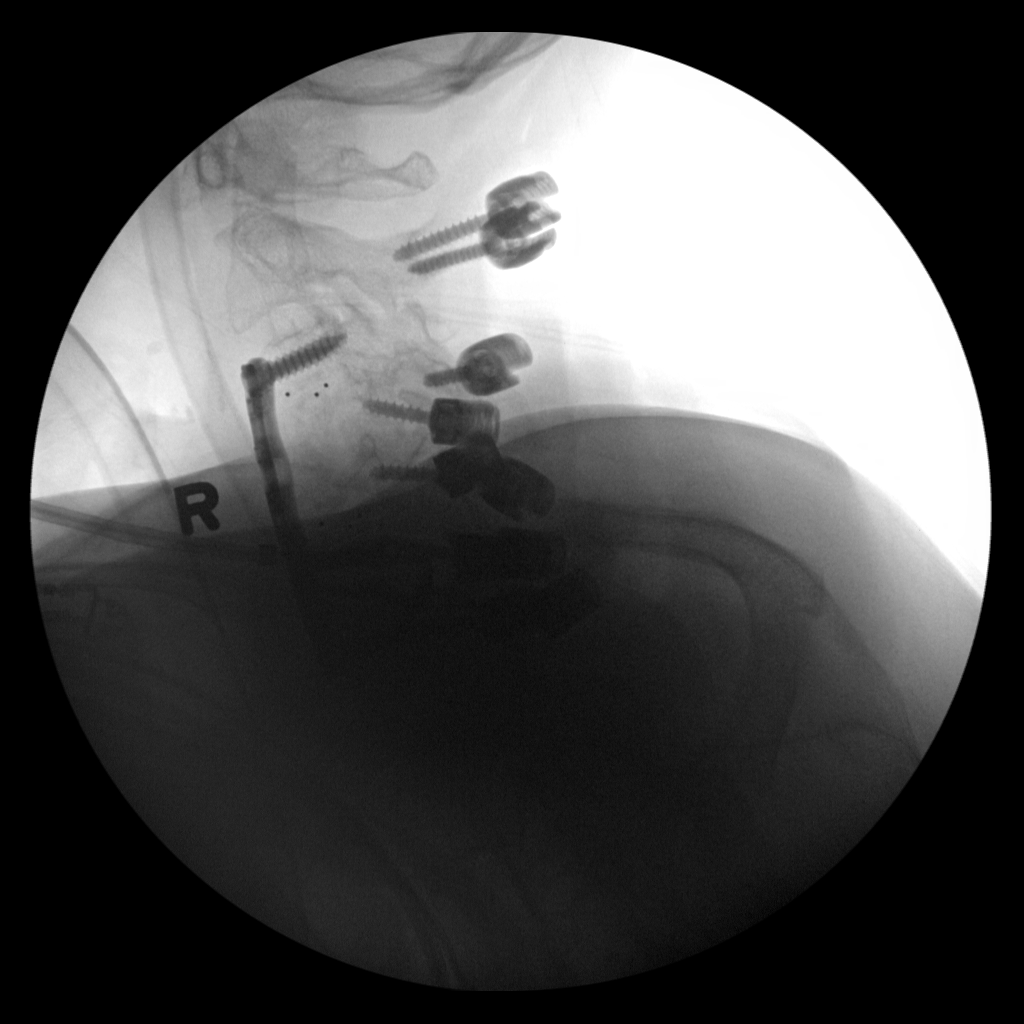

[5 of 5 positions shown; findings below may reference images not displayed]

FINDINGS: Prior partially visualized anterior cervical discectomies and
corpectomy with ventral plate. Current operative finding show
posterior cervical fusion with multilevel lateral mass screws.
Spinal alignment, including C2-3 adjacent segment anterolisthesis,
is as previously seen. No evidence of intraoperative fracture.
IMPRESSION: Fluoroscopy for posterior cervical fusion.

## 2016-07-18 ENCOUNTER — Emergency Department (HOSPITAL_COMMUNITY): Payer: BLUE CROSS/BLUE SHIELD

## 2016-07-18 ENCOUNTER — Encounter (HOSPITAL_COMMUNITY): Payer: Self-pay | Admitting: Vascular Surgery

## 2016-07-18 DIAGNOSIS — I5032 Chronic diastolic (congestive) heart failure: Secondary | ICD-10-CM | POA: Insufficient documentation

## 2016-07-18 DIAGNOSIS — Z87891 Personal history of nicotine dependence: Secondary | ICD-10-CM | POA: Diagnosis not present

## 2016-07-18 DIAGNOSIS — Z79899 Other long term (current) drug therapy: Secondary | ICD-10-CM | POA: Insufficient documentation

## 2016-07-18 DIAGNOSIS — N182 Chronic kidney disease, stage 2 (mild): Secondary | ICD-10-CM | POA: Diagnosis not present

## 2016-07-18 DIAGNOSIS — J449 Chronic obstructive pulmonary disease, unspecified: Secondary | ICD-10-CM | POA: Insufficient documentation

## 2016-07-18 DIAGNOSIS — R0602 Shortness of breath: Secondary | ICD-10-CM | POA: Diagnosis not present

## 2016-07-18 LAB — BASIC METABOLIC PANEL
ANION GAP: 5 (ref 5–15)
BUN: 14 mg/dL (ref 6–20)
CALCIUM: 9.2 mg/dL (ref 8.9–10.3)
CO2: 26 mmol/L (ref 22–32)
CREATININE: 1.03 mg/dL — AB (ref 0.44–1.00)
Chloride: 108 mmol/L (ref 101–111)
GFR, EST NON AFRICAN AMERICAN: 59 mL/min — AB (ref >60)
Glucose, Bld: 93 mg/dL (ref 65–99)
Potassium: 4 mmol/L (ref 3.5–5.1)
SODIUM: 139 mmol/L (ref 135–145)

## 2016-07-18 LAB — CBC
HCT: 43.1 % (ref 36.0–46.0)
HEMOGLOBIN: 14.4 g/dL (ref 12.0–15.0)
MCH: 31.1 pg (ref 26.0–34.0)
MCHC: 33.4 g/dL (ref 30.0–36.0)
MCV: 93.1 fL (ref 78.0–100.0)
PLATELETS: 242 10*3/uL (ref 150–400)
RBC: 4.63 MIL/uL (ref 3.87–5.11)
RDW: 13.7 % (ref 11.5–15.5)
WBC: 9.4 10*3/uL (ref 4.0–10.5)

## 2016-07-18 LAB — I-STAT TROPONIN, ED: TROPONIN I, POC: 0 ng/mL (ref 0.00–0.08)

## 2016-07-18 NOTE — ED Triage Notes (Signed)
Pt reports to the ED for eval of increased SOB x 3-4 weeks. She reports she got an URI and she has been feeling bad ever since. She reports she was seen by her PP who told her to go to [place] and have blood work done and then come here for scans. Pt denies any CP. Pt reports SOB is at rest and with exertion. Pt reports her PCP was concerned for possible blood clot.

## 2016-07-19 ENCOUNTER — Emergency Department (HOSPITAL_COMMUNITY)
Admission: EM | Admit: 2016-07-19 | Discharge: 2016-07-19 | Disposition: A | Discharge status: Home / Self Care | Payer: BLUE CROSS/BLUE SHIELD | Attending: Emergency Medicine | Admitting: Emergency Medicine

## 2016-07-19 ENCOUNTER — Emergency Department (HOSPITAL_COMMUNITY): Payer: BLUE CROSS/BLUE SHIELD

## 2016-07-19 DIAGNOSIS — R0602 Shortness of breath: Secondary | ICD-10-CM

## 2016-07-19 HISTORY — DX: Polyneuropathy, unspecified: G62.9

## 2016-07-19 MED ORDER — IOPAMIDOL (ISOVUE-370) INJECTION 76%
INTRAVENOUS | Status: AC
  Administered 2016-07-19: 100 mL
  Filled 2016-07-19: qty 100

## 2016-07-19 NOTE — ED Provider Notes (Signed)
[facility]-EMERGENCY DEPT Provider Note   CSN: [id number] Arrival date & time: 07/18/16  2208   By signing my name below, I, Clovis PuAvnee Patel, attest that this documentation has been prepared under the direction and in the presence of Tomasita CrumbleAdeleke Laqueta Bonaventura, MD  Electronically Signed: Clovis PuAvnee Patel, ED Scribe. 07/19/16. 1:08 AM.   History   Chief Complaint Chief Complaint  Patient presents with  . Shortness of Breath   The history is provided by the patient. No language interpreter was used.   HPI Comments:  [name] is a 60 y.o. female, with a hx of COPD and CHF, who presents to the Emergency Department complaining of persistent SOB x several weeks. Pt also reports chest discomfort, described as a heaviness, x 5-6 weeks. She states it feels like there is an elephant sitting on her chest. Pt states using her oxygen makes her symptoms better and her symptoms are worse at night when she tries to sleep. Pt denies leg swelling, any other associated symptoms and any other modifying factors at this time. She is on 20 mg of lasix daily. Pt went to a clinic in [place] yesterday for blood work and was told to come to the ED for a possible blood clot.    Past Medical History:  Diagnosis Date  . Arthritis   . Asthma    Breo and Combivent daily  . CHF (congestive heart failure) (HCC)    takes Lasix daily  . COPD (chronic obstructive pulmonary disease) (HCC)   . Depression    takes Wellbutrin and Citalopram daily  . Emphysema lung (HCC)   . GERD (gastroesophageal reflux disease)    takes Omeprazole daily  . Glaucoma    mild"  . Headache    daily   . History of blood transfusion 1983   no abnormal reaction noted  . History of bronchitis 5-6wks ago  . Joint pain   . Joint swelling   . Mood disorder (HCC)   . Multiple allergies    Fluticasone and Mucinex daily  . Peripheral neuropathy (HCC)   . Pneumonia under the age of 60   hx of  . SOB (shortness of breath)    with exertion  . Vertigo    takes  Meclizine daily as needed  . Weakness    numbness and tingling in right arm and leg    Patient Active Problem List   Diagnosis Date Noted  . Atopy 01/26/2016  . COPD GOLD I 10/17/2015  . Myelopathy, spondylogenic, cervical 06/22/2015  . Preoperative clearance 06/15/2015  . Chronic diastolic congestive heart failure (HCC) 08/02/2013  . Emphysema/COPD Gold B 07/31/2013  . Sleep disorder/? OSA 07/31/2013  . Abnormal TSH 07/31/2013  . 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 Mood disorder (HCC)   . S/P rotator cuff repair   . H/O laminectomy     Past Surgical History:  Procedure Laterality Date  . ANTERIOR CERVICAL CORPECTOMY N/A 06/22/2015   Procedure: ANTERIOR CERVICAL DECOMPRESSION/DISCECTOMY FUSION C6 - C7 with corpectomy;  Surgeon: Donalee CitrinGary Cram, MD;  Location: [facility];  Service: Neurosurgery;  Laterality: N/A;  . BACK SURGERY     lumbar  . NECK SURGERY    . POSTERIOR CERVICAL FUSION/FORAMINOTOMY  07/08/2015   Procedure: POSTERIOR CERVICAL FUSION/FORAMINOTOMY CERVICAL TWO TO CERVICAL SEVEN;  Surgeon: Donalee CitrinGary Cram, MD;  Location: [facility];  Service: Neurosurgery;;  . POSTERIOR FUSION CERVICAL SPINE  07/08/2015  . ROTATOR CUFF REPAIR     right  . TONSILLECTOMY  AND ADENOIDECTOMY    . TUBAL LIGATION      OB History    No data available       Home Medications    Prior to Admission medications   Medication Sig Start Date End Date Taking? Authorizing Provider  buPROPion (WELLBUTRIN SR) 150 MG 12 hr tablet Take 150 mg by mouth 2 (two) times daily.    Historical Provider, MD  Cholecalciferol (HM VITAMIN D3) 4000 UNITS CAPS Take 4,000 Units by mouth daily.     Historical Provider, MD  FLUoxetine (PROZAC) 20 MG capsule Take 2 capsules by mouth daily.  01/19/16   Historical Provider, MD  fluticasone furoate-vilanterol (BREO ELLIPTA) 100-25 MCG/INH AEPB Inhale 1 puff into the lungs every morning. 04/25/16   Nyoka Cowden, MD  furosemide (LASIX) 20 MG tablet Take 1  tablet (20 mg total) by mouth daily. 09/29/13   Rollene Rotunda, MD  guaiFENesin (MUCINEX) 600 MG 12 hr tablet Take 600 mg by mouth 2 (two) times daily.    Historical Provider, MD  indomethacin (INDOCIN SR) 75 MG CR capsule Take 75 mg by mouth 2 (two) times daily. 02/04/15   Historical Provider, MD  Ipratropium-Albuterol (COMBIVENT RESPIMAT) 20-100 MCG/ACT AERS respimat One puff up to every 4 hours as needed 03/02/16   Nyoka Cowden, MD  LORazepam (ATIVAN) 0.5 MG tablet Take 1 tablet by mouth daily as needed. 12/29/15   Historical Provider, MD  Melatonin 5 MG TABS Take 5 mg by mouth at bedtime.    Historical Provider, MD  methocarbamol (ROBAXIN) 500 MG tablet Take 500 mg by mouth 4 (four) times daily.    Historical Provider, MD  mometasone (NASONEX) 50 MCG/ACT nasal spray Place 2 sprays into the nose daily. 10/13/15   Nyoka Cowden, MD  omeprazole (PRILOSEC) 20 MG capsule Take 1 capsule by mouth daily.    Historical Provider, MD  polyethylene glycol (MIRALAX / GLYCOLAX) packet Take 17 g by mouth daily.    Historical Provider, MD  PREMPRO 0.3-1.5 MG per tablet Take 1 tablet by mouth daily.    Historical Provider, MD  zolpidem (AMBIEN) 10 MG tablet Take 10 mg by mouth at bedtime as needed. 01/10/16   Historical Provider, MD    Family History Family History  Problem Relation Age of Onset  . COPD Mother   . Heart disease Mother     Valve disease  . Allergies Mother   . Asthma Mother   . CAD Father 18  . Emphysema Paternal Grandfather   . CAD Brother 50    Mild CAD    Social History Social History  Substance Use Topics  . Smoking status: Former Smoker    Packs/day: 1.00    Years: 15.00    Types: Cigarettes    Quit date: 07/30/1997  . Smokeless tobacco: Never Used  . Alcohol use No     Allergies   Patient has no known allergies.   Review of Systems Review of Systems 10 Systems reviewed and are negative for acute change except as noted in the HPI.  Physical Exam Updated Vital  Signs BP 132/75 (BP Location: Left Arm)   Pulse 97   Temp 98.7 F (37.1 C) (Oral)   Resp 20   SpO2 97%   Physical Exam  Constitutional: She is oriented to person, place, and time. She appears well-developed and well-nourished. No distress.  nasal canula in place  HENT:  Head: Normocephalic and atraumatic.  Nose: Nose normal.  Mouth/Throat: Oropharynx is  clear and moist. No oropharyngeal exudate.  Eyes: Conjunctivae and EOM are normal. Pupils are equal, round, and reactive to light. No scleral icterus.  Neck: Normal range of motion. Neck supple. No JVD present. No tracheal deviation present. No thyromegaly present.  Cardiovascular: Normal rate, regular rhythm and normal heart sounds.  Exam reveals no gallop and no friction rub.   No murmur heard. Pulmonary/Chest: Effort normal and breath sounds normal. No respiratory distress. She has no wheezes. She exhibits no tenderness.  Abdominal: Soft. Bowel sounds are normal. She exhibits no distension and no mass. There is no tenderness. There is no rebound and no guarding.  Musculoskeletal: Normal range of motion. She exhibits no edema or tenderness.  Lymphadenopathy:    She has no cervical adenopathy.  Neurological: She is alert and oriented to person, place, and time. No cranial nerve deficit. She exhibits normal muscle tone.  Skin: Skin is warm and dry. No rash noted. No erythema. No pallor.  Nursing note and vitals reviewed.  ED Treatments / Results  DIAGNOSTIC STUDIES:  Oxygen Saturation is 97% on [place], normal by my interpretation.    COORDINATION OF CARE:  1:05 AM Discussed treatment plan with pt at bedside and pt agreed to plan.  Labs (all labs ordered are listed, but only abnormal results are displayed) Labs Reviewed  BASIC METABOLIC PANEL - Abnormal; Notable for the following:       Result Value   Creatinine, Ser 1.03 (*)    GFR calc non Af Amer 59 (*)    All other components within normal limits  CBC  I-STAT TROPOININ, ED     EKG  EKG Interpretation  Date/Time:  Wednesday July 18 2016 22:11:34 EST Ventricular Rate:  97 PR Interval:  150 QRS Duration: 66 QT Interval:  334 QTC Calculation: 424 R Axis:   58 Text Interpretation:  Normal sinus rhythm Normal ECG No old tracing to compare Confirmed by Erroll Luna [phone]) on 07/19/2016 12:51:32 AM       Radiology Dg Chest 2 View  Result Date: 07/18/2016 CLINICAL DATA:  Dyspnea for several weeks. EXAM: CHEST  2 VIEW COMPARISON:  [id number] FINDINGS: Unchanged mild left hemidiaphragm elevation. The lungs are clear. No pleural effusions. Normal hilar and mediastinal contours. Normal heart size. Normal pulmonary vasculature. IMPRESSION: No active cardiopulmonary disease. Electronically Signed   By: Ellery Plunk M.D.   On: 07/18/2016 22:53    Procedures Procedures (including critical care time)  Medications Ordered in ED Medications - No data to display   Initial Impression / Assessment and Plan / ED Course  I have reviewed the triage vital signs and the nursing notes.  Pertinent labs & imaging results that were available during my care of the patient were reviewed by me and considered in my medical decision making (see chart for details).  Clinical Course     Patient presents to the ED for SOB.  She was evaluated at outside clinic and dimer was presumably elevated.  She does not have her results but states she was sent her for CT, this was ordered.  She is currently 98% on RA and appears in NAD.  Although she did have recent surgery.   CT neg for PE.  She continues to appear well and in NAD.  No resp distress.  VS remain within her normal limits and she Is safe for Dc. Advised to fu with PCP for further symptom control  Final Clinical Impressions(s) / ED Diagnoses   Final diagnoses:  None    New Prescriptions New Prescriptions   No medications on file    I personally performed the services described in this documentation, which  was scribed in my presence. The recorded information has been reviewed and is accurate.       Tomasita CrumbleAdeleke Veneda Kirksey, MD 07/19/16 [id number]

## 2016-07-24 ENCOUNTER — Encounter: Payer: Self-pay | Admitting: Cardiology

## 2016-07-24 ENCOUNTER — Ambulatory Visit (INDEPENDENT_AMBULATORY_CARE_PROVIDER_SITE_OTHER): Payer: BLUE CROSS/BLUE SHIELD | Admitting: Cardiology

## 2016-07-24 VITALS — BP 120/88 | HR 95 | Ht 62.5 in | Wt 178.6 lb

## 2016-07-24 DIAGNOSIS — R000 Tachycardia, unspecified: Secondary | ICD-10-CM

## 2016-07-24 DIAGNOSIS — R0602 Shortness of breath: Secondary | ICD-10-CM

## 2016-07-24 NOTE — Patient Instructions (Addendum)
Medication Instructions:  Continue current medications  Labwork: None Ordered  Testing/Procedures: Your physician has recommended that you have a cardiopulmonary stress test (CPX). CPX testing is a non-invasive measurement of heart and lung function. It replaces a traditional treadmill stress test. This type of test provides a tremendous amount of information that relates not only to your present condition but also for future outcomes. This test combines measurements of you ventilation, respiratory gas exchange in the lungs, electrocardiogram (EKG), blood pressure and physical response before, during, and following an exercise protocol.  Your physician has recommended that you wear a 48 hour holter monitor. Holter monitors are medical devices that record the heart's electrical activity. Doctors most often use these monitors to diagnose arrhythmias. Arrhythmias are problems with the speed or rhythm of the heartbeat. The monitor is a small, portable device. You can wear one while you do your normal daily activities. This is usually used to diagnose what is causing palpitations/syncope (passing out).  Follow-Up: Your physician recommends that you schedule a follow-up appointment in: After Test   Any Other Special Instructions Will Be Listed Below (If Applicable).      HAPPY NEW YEAR   If you need a refill on your cardiac medications before your next appointment, please call your pharmacy.

## 2016-07-24 NOTE — Progress Notes (Signed)
HPI The patient has been seen multiple times for dyspnea. In January 2015 she had a well preserved systolic function on echo. She apparently has had some coronary calcium.  She had a negative Lexiscan Myoview in 2016.  She was added to my schedule.  She was in the ED recently with dyspnea.  I reviewed these records.  [facility] and apparently had an elevated D Dimer.  In our ED she had normal O2 sats and was sent for a CT which was negative for evidence of PE.  She reports that her dyspnea is worse than previous.  She says that her oxygen level fell to 92% this morning. She wears oxygen at night. She said her heart rate sometimes goes in the 130s with ambulation. She's not describing chest pressure, neck or arm discomfort. She's not describing PND or orthopnea.   No Known Allergies  Current Outpatient Prescriptions  Medication Sig Dispense Refill  . buPROPion (WELLBUTRIN SR) 150 MG 12 hr tablet Take 150 mg by mouth 2 (two) times daily.    . Cholecalciferol (HM VITAMIN D3) 4000 UNITS CAPS Take 4,000 Units by mouth daily.     Marland Kitchen. FLUoxetine (PROZAC) 20 MG capsule Take 2 capsules by mouth daily.   1  . fluticasone furoate-vilanterol (BREO ELLIPTA) 100-25 MCG/INH AEPB Inhale 1 puff into the lungs every morning. 28 each 11  . furosemide (LASIX) 20 MG tablet Take 1 tablet (20 mg total) by mouth daily. 90 tablet 3  . guaiFENesin (MUCINEX) 600 MG 12 hr tablet Take 600 mg by mouth 2 (two) times daily.    . indomethacin (INDOCIN SR) 75 MG CR capsule Take 75 mg by mouth 2 (two) times daily.    . Ipratropium-Albuterol (COMBIVENT RESPIMAT) 20-100 MCG/ACT AERS respimat One puff up to every 4 hours as needed 1 Inhaler 11  . LORazepam (ATIVAN) 0.5 MG tablet Take 1 tablet by mouth daily as needed.  0  . Melatonin 5 MG TABS Take 5 mg by mouth at bedtime.    . methocarbamol (ROBAXIN) 500 MG tablet Take 500 mg by mouth 4 (four) times daily.    . mometasone (NASONEX) 50 MCG/ACT nasal spray  Place 2 sprays into the nose daily. 17 g 11  . omeprazole (PRILOSEC) 20 MG capsule Take 1 capsule by mouth daily.    . polyethylene glycol (MIRALAX / GLYCOLAX) packet Take 17 g by mouth daily.    Marland Kitchen. PREMPRO 0.3-1.5 MG per tablet Take 1 tablet by mouth daily.    Marland Kitchen. zolpidem (AMBIEN) 10 MG tablet Take 10 mg by mouth at bedtime as needed.  1   No current facility-administered medications for this visit.     Past Medical History:  Diagnosis Date  . Arthritis   . Asthma    Breo and Combivent daily  . CHF (congestive heart failure) (HCC)    takes Lasix daily  . COPD (chronic obstructive pulmonary disease) (HCC)   . Depression    takes Wellbutrin and Citalopram daily  . Emphysema lung (HCC)   . GERD (gastroesophageal reflux disease)    takes Omeprazole daily  . Glaucoma    mild"  . Headache    daily   . History of blood transfusion 1983   no abnormal reaction noted  . History of bronchitis 5-6wks ago  . Joint pain   . Joint swelling   . Mood disorder (HCC)   . Multiple allergies    Fluticasone and Mucinex daily  .  Peripheral neuropathy (HCC)   . Pneumonia under the age of 60   hx of  . SOB (shortness of breath)    with exertion  . Vertigo    takes Meclizine daily as needed  . Weakness    numbness and tingling in right arm and leg    Past Surgical History:  Procedure Laterality Date  . ANTERIOR CERVICAL CORPECTOMY N/A 06/22/2015   Procedure: ANTERIOR CERVICAL DECOMPRESSION/DISCECTOMY FUSION C6 - C7 with corpectomy;  Surgeon: Donalee CitrinGary Cram, MD;  Location: [facility];  Service: Neurosurgery;  Laterality: N/A;  . BACK SURGERY     lumbar  . NECK SURGERY    . POSTERIOR CERVICAL FUSION/FORAMINOTOMY  07/08/2015   Procedure: POSTERIOR CERVICAL FUSION/FORAMINOTOMY CERVICAL TWO TO CERVICAL SEVEN;  Surgeon: Donalee CitrinGary Cram, MD;  Location: [facility];  Service: Neurosurgery;;  . POSTERIOR FUSION CERVICAL SPINE  07/08/2015  . ROTATOR CUFF REPAIR     right  . TONSILLECTOMY AND ADENOIDECTOMY     . TUBAL LIGATION      ROS:  As stated in the HPI and negative for all other systems.  PHYSICAL EXAM There were no vitals taken for this visit. GENERAL:  Well appearing NECK:  No jugular venous distention, waveform within normal limits, carotid upstroke brisk and symmetric, no bruits, no thyromegaly LUNGS:  Clear to auscultation bilaterally HEART:  PMI not displaced or sustained,S1 and S2 within normal limits, no S3, no S4, no clicks, no rubs, no murmurs ABD:  Flat, positive bowel sounds normal in frequency in pitch, no bruits, no rebound, no guarding, no midline pulsatile mass, no hepatomegaly, no splenomegaly EXT:  2 plus pulses throughout, no edema, no cyanosis no clubbing  EKG:  Sinus rhythm, rate 97, axis within normal limits, intervals within normal limits, no ST-T wave changes.  07/18/16  ASSESSMENT AND PLAN  DYSPNEA:  This has been long-standing. There's been no clear cardiac etiology. She does have a history of COPD. Her oxygen level today in the office  I will order a CPX.   CHRONIC DIASTOLIC HF:  I do not suspect this is contributing. She seems to be euvolemic. She will continue on meds as listed.   TACHYCARDIA:  I will order a 48 hour Holter.

## 2016-07-25 ENCOUNTER — Ambulatory Visit (INDEPENDENT_AMBULATORY_CARE_PROVIDER_SITE_OTHER): Payer: BLUE CROSS/BLUE SHIELD

## 2016-07-25 ENCOUNTER — Inpatient Hospital Stay: Payer: BLUE CROSS/BLUE SHIELD | Admitting: Internal Medicine

## 2016-07-25 ENCOUNTER — Telehealth: Payer: Self-pay | Admitting: *Deleted

## 2016-07-25 ENCOUNTER — Other Ambulatory Visit: Payer: Self-pay | Admitting: Cardiology

## 2016-07-25 DIAGNOSIS — R0602 Shortness of breath: Secondary | ICD-10-CM

## 2016-07-25 DIAGNOSIS — R000 Tachycardia, unspecified: Secondary | ICD-10-CM | POA: Diagnosis not present

## 2016-07-25 NOTE — Telephone Encounter (Signed)
Spoke with patient regarding appointment for CPX----08/09/16 @ 3:00pm.  I informed patient I will mail instructions and appointment information to her.  1 month f/u appt with Dr. Antoine PocheHochrein was also scheduled.  She voiced her understanding.

## 2016-08-09 ENCOUNTER — Ambulatory Visit (HOSPITAL_COMMUNITY): Payer: BLUE CROSS/BLUE SHIELD | Attending: Cardiology

## 2016-08-09 DIAGNOSIS — R0602 Shortness of breath: Secondary | ICD-10-CM | POA: Diagnosis present

## 2016-08-10 ENCOUNTER — Other Ambulatory Visit (HOSPITAL_COMMUNITY): Payer: Self-pay | Admitting: *Deleted

## 2016-08-10 DIAGNOSIS — R0602 Shortness of breath: Secondary | ICD-10-CM | POA: Diagnosis not present

## 2016-08-23 NOTE — Progress Notes (Signed)
HPI The patient has been seen multiple times for dyspnea. In January 2015 she had a well preserved systolic function on echo. She apparently has had some coronary calcium.  She had a negative Lexiscan Myoview in 2016.  At the last visit she had increased dyspnea and had been in the ED.  I sent her for a CPX.  This suggested that she was likely limited by pulmonary obstructive disease.  She was very deconditioned.  Holter had no significant arrhythmias.  Since I last saw her she is not had as many palpitations. She said when she took the 8 hour monitor she had some. However, seems to be less bothersome than before. She does note that it's worse when she has shortness of breath she is taking more bronchodilators at that time which might contribute. She gets around slowly because of her back. She's not describing chest pressure, neck or arm discomfort. She's not describing PND or orthopnea.   No Known Allergies  Current Outpatient Prescriptions  Medication Sig Dispense Refill  . BREO ELLIPTA 200-25 MCG/INH AEPB Inhale 1 puff into the lungs every morning.  4  . Cholecalciferol (HM VITAMIN D3) 4000 UNITS CAPS Take 4,000 Units by mouth daily.     . furosemide (LASIX) 20 MG tablet Take 1 tablet (20 mg total) by mouth daily. 90 tablet 3  . guaiFENesin (MUCINEX) 600 MG 12 hr tablet Take 600 mg by mouth 2 (two) times daily.    . indomethacin (INDOCIN SR) 75 MG CR capsule Take 75 mg by mouth 2 (two) times daily.    . Ipratropium-Albuterol (COMBIVENT RESPIMAT) 20-100 MCG/ACT AERS respimat One puff up to every 4 hours as needed 1 Inhaler 11  . LORazepam (ATIVAN) 0.5 MG tablet Take 1 tablet by mouth daily as needed.  0  . LYRICA 100 MG capsule Take 100 mg by mouth 2 (two) times daily.  0  . methocarbamol (ROBAXIN) 500 MG tablet Take 500 mg by mouth 4 (four) times daily.    . mometasone (NASONEX) 50 MCG/ACT nasal spray Place 2 sprays into the nose daily. 17 g 11  . omeprazole (PRILOSEC) 20 MG capsule Take  1 capsule by mouth daily.    . polyethylene glycol (MIRALAX / GLYCOLAX) packet Take 17 g by mouth daily.    Marland Kitchen. PREMPRO 0.3-1.5 MG per tablet Take 1 tablet by mouth daily.    . TRINTELLIX 20 MG TABS Take 20 mg by mouth daily.  3  . zolpidem (AMBIEN) 10 MG tablet Take 10 mg by mouth at bedtime as needed.  1   No current facility-administered medications for this visit.     Past Medical History:  Diagnosis Date  . Arthritis   . Asthma    Breo and Combivent daily  . CHF (congestive heart failure) (HCC)    takes Lasix daily  . COPD (chronic obstructive pulmonary disease) (HCC)   . Depression    takes Wellbutrin and Citalopram daily  . Emphysema lung (HCC)   . GERD (gastroesophageal reflux disease)    takes Omeprazole daily  . Glaucoma    mild"  . History of blood transfusion 1983   no abnormal reaction noted  . History of bronchitis 5-6wks ago  . Joint swelling   . Mood disorder (HCC)   . Multiple allergies    Fluticasone and Mucinex daily  . Peripheral neuropathy (HCC)   . Pneumonia under the age of 61   hx of  . Vertigo  takes Meclizine daily as needed  . Weakness    numbness and tingling in right arm and leg    Past Surgical History:  Procedure Laterality Date  . ANTERIOR CERVICAL CORPECTOMY N/A 06/22/2015   Procedure: ANTERIOR CERVICAL DECOMPRESSION/DISCECTOMY FUSION C6 - C7 with corpectomy;  Surgeon: Donalee Citrin, MD;  Location: [facility];  Service: Neurosurgery;  Laterality: N/A;  . BACK SURGERY     lumbar  . NECK SURGERY    . POSTERIOR CERVICAL FUSION/FORAMINOTOMY  07/08/2015   Procedure: POSTERIOR CERVICAL FUSION/FORAMINOTOMY CERVICAL TWO TO CERVICAL SEVEN;  Surgeon: Donalee Citrin, MD;  Location: [facility];  Service: Neurosurgery;;  . POSTERIOR FUSION CERVICAL SPINE  07/08/2015  . ROTATOR CUFF REPAIR     right  . TONSILLECTOMY AND ADENOIDECTOMY    . TUBAL LIGATION      ROS:    As stated in the HPI and negative for all other systems.  PHYSICAL EXAM BP  136/87   Pulse 95   Ht 5\' 2"  (1.575 m)   Wt 183 lb (83 kg)   BMI 33.47 kg/m  GENERAL:  Well appearing NECK:  No jugular venous distention, waveform within normal limits, carotid upstroke brisk and symmetric, no bruits, no thyromegaly LUNGS:  Clear to auscultation bilaterally HEART:  PMI not displaced or sustained,S1 and S2 within normal limits, no S3, no S4, no clicks, no rubs, no murmurs ABD:  Flat, positive bowel sounds normal in frequency in pitch, no bruits, no rebound, no guarding, no midline pulsatile mass, no hepatomegaly, no splenomegaly EXT:  2 plus pulses throughout, no edema, no cyanosis no clubbing   ASSESSMENT AND PLAN   DYSPNEA:  I think this is related to some deconditioning and chronic lung disease. No change in therapy is indicated at this point.  CHRONIC DIASTOLIC HF:  I do not suspect this is contributing. She seems to be euvolemic. She will continue on meds as listed.   TACHYCARDIA:  If she has recurrence of these symptoms she would need an event monitor. We also talked about [employer]

## 2016-08-24 ENCOUNTER — Encounter: Payer: Self-pay | Admitting: Cardiology

## 2016-08-24 ENCOUNTER — Ambulatory Visit (INDEPENDENT_AMBULATORY_CARE_PROVIDER_SITE_OTHER): Payer: BLUE CROSS/BLUE SHIELD | Admitting: Cardiology

## 2016-08-24 VITALS — BP 136/87 | HR 95 | Ht 62.0 in | Wt 183.0 lb

## 2016-08-24 DIAGNOSIS — I5032 Chronic diastolic (congestive) heart failure: Secondary | ICD-10-CM | POA: Diagnosis not present

## 2016-08-24 DIAGNOSIS — R0602 Shortness of breath: Secondary | ICD-10-CM

## 2016-08-24 NOTE — Patient Instructions (Signed)

## 2016-08-25 ENCOUNTER — Encounter: Payer: Self-pay | Admitting: Cardiology

## 2018-04-18 ENCOUNTER — Telehealth: Payer: Self-pay

## 2018-04-18 NOTE — Telephone Encounter (Signed)
[facility], Dr. Jerene BearsKristen Dodge P; [phone] F: [phone].  Notes sent to scheduling.

## 2018-04-24 ENCOUNTER — Encounter: Payer: Self-pay | Admitting: Internal Medicine

## 2018-04-24 ENCOUNTER — Ambulatory Visit (INDEPENDENT_AMBULATORY_CARE_PROVIDER_SITE_OTHER): Payer: Medicaid Other | Admitting: Internal Medicine

## 2018-04-24 VITALS — BP 104/68 | HR 116 | Ht 62.0 in | Wt 185.0 lb

## 2018-04-24 DIAGNOSIS — R0609 Other forms of dyspnea: Secondary | ICD-10-CM | POA: Diagnosis not present

## 2018-04-24 DIAGNOSIS — Z23 Encounter for immunization: Secondary | ICD-10-CM | POA: Diagnosis not present

## 2018-04-24 DIAGNOSIS — Z889 Allergy status to unspecified drugs, medicaments and biological substances status: Secondary | ICD-10-CM | POA: Diagnosis not present

## 2018-04-24 DIAGNOSIS — J449 Chronic obstructive pulmonary disease, unspecified: Secondary | ICD-10-CM | POA: Diagnosis not present

## 2018-04-24 MED ORDER — FLUTICASONE FUROATE-VILANTEROL 100-25 MCG/INH IN AEPB: 1.0000 | INHALATION_SPRAY | Freq: Every day | RESPIRATORY_TRACT | 0 refills | Status: DC

## 2018-04-24 NOTE — Progress Notes (Signed)
Subjective:     Patient ID: [name], female   DOB: 1955/08/26,     MRN: [id number]    Brief patient profile:  84 yowf quit smoking 1999 with cough that resolved and eval by Dr Delford Field in [place] with dx GOLD I copd  Vs AB assoc  seasonal allergy.   History of Present Illness  10/13/2015 1st  office visit/ [name]  rx  Combivent/ no saba  Chief Complaint  Patient presents with  . Follow-up    Former patient of Dr Delford Field. She c/o increased SOB and cough x 2 wks. She states that she is SOB with or without any exertion. She does not have a rescue inhaler.   not using 02 at all since 1st Feb 2017  Doe x [facility] = can't walk a nl pace on a flat grade s sob More cough at hs esp x 2 weeks  rec Plan A = Automatic = Symbicort 160 Take 2 puffs first thing in am and then another 2 puffs about 12 hours later.  Plan B = Backup Only use your combivent as a rescue medication    01/25/2016  f/u ov/[name] re: GOLD I copd / breo 200 and combivent x 2 pffs  Chief Complaint  Patient presents with  . Follow-up    Pt states her breathing is about the same. She has had minimal cough- prod with clear sputum.    doe = MMRC1 = can walk nl pace, flat grade, can't hurry or go uphills or steps s sob   Wants to know if allergic to cats, considering buying one  No noct cough/ no copd flares since last ov  rec Plan A = Automatic = Stiolto 2 puffs each am instead of Breo  Plan B = Backup Only use your combivent as a rescue medication  Please remember to go to the lab  department downstairs for your tests - we will call you with the results when they are available.     04/25/2016  f/u ov/[name] re: GOLD I  On breo 200 and prn combivent Chief Complaint  Patient presents with  . Follow-up    Breathing was worse with stiolto, so started back on breo. She had been doing better until a few days ago, started having increased SOB and sneezing- relates to allergies. She is using combivent 2 x per day on average.   confused  re approp use of prns and on breo 200 now  rec Plan A = Automatic = Breo  ONE CLICK each am x 2 drags  Plan B = Backup Only use your combivent s a rescue medication    04/24/2018  f/u ov/[name] re:  GOLD I  Chief Complaint  Patient presents with  . Acute Visit    Pt states her breathing has been worse since she ran stopped Breo 2 months ago. She states her insurance stopped covering med. She is using combivent 3 x daily on average.   Dyspnea:  MMRC3 = can't walk 100 yards even at a slow pace at a flat grade s stopping due to sob   Cough: dry cough/ mostly daytime Sleeping: flat bed/ 2 pillows  SABA use: varibly over using combivent since ran out of breo / prednisone for shoulder helped the breathing only a little, may have helped the cough more  02:  2lpm at bedtime and as needed but not desated      No obvious day to day or daytime variability or assoc excess/ purulent  sputum or mucus plugs or hemoptysis or cp or chest tightness, subjective wheeze or overt sinus or hb symptoms.   Sleeping as above  without nocturnal  or early am exacerbation  of respiratory  c/o's or need for noct saba. Also denies any obvious fluctuation of symptoms with weather or environmental changes or other aggravating or alleviating factors except as outlined above   No unusual exposure hx or h/o childhood pna/ asthma or knowledge of premature birth.  Current Allergies, Complete Past Medical History, Past Surgical History, Family History, and Social History were reviewed in [employer] record.  ROS  The following are not active complaints unless bolded Hoarseness, sore throat, dysphagia, dental problems, itching, sneezing,  nasal congestion or discharge of excess mucus or purulent secretions, ear ache,   fever, chills, sweats, unintended wt loss or wt gain, classically pleuritic or exertional cp,  orthopnea pnd or arm/hand swelling  or leg swelling, presyncope, palpitations, abdominal pain,  anorexia, nausea, vomiting, diarrhea  or change in bowel habits or change in bladder habits, change in stools or change in urine, dysuria, hematuria,  rash, arthralgias, visual complaints, headache, numbness, weakness or ataxia or problems with walking or coordination,  change in mood or  memory.        Current Meds  Medication Sig  . furosemide (LASIX) 20 MG tablet Take 1 tablet (20 mg total) by mouth daily.  Marland Kitchen guaiFENesin (MUCINEX) 600 MG 12 hr tablet Take 600 mg by mouth 2 (two) times daily.  . indomethacin (INDOCIN SR) 75 MG CR capsule Take 75 mg by mouth 2 (two) times daily.  . Ipratropium-Albuterol (COMBIVENT RESPIMAT) 20-100 MCG/ACT AERS respimat One puff up to every 4 hours as needed  . LORazepam (ATIVAN) 0.5 MG tablet Take 1 tablet by mouth daily as needed.  . methocarbamol (ROBAXIN) 500 MG tablet Take 500 mg by mouth 4 (four) times daily.  . mometasone (NASONEX) 50 MCG/ACT nasal spray Place 2 sprays into the nose daily.  Marland Kitchen omeprazole (PRILOSEC) 20 MG capsule Take 1 capsule by mouth daily.  . OXYGEN Inhale into the lungs.  . polyethylene glycol (MIRALAX / GLYCOLAX) packet Take 17 g by mouth daily.  Marland Kitchen PREMPRO 0.3-1.5 MG per tablet Take 1 tablet by mouth daily.  Marland Kitchen zolpidem (AMBIEN) 10 MG tablet Take 10 mg by mouth at bedtime as needed.      .                        Objective:   Physical Exam    amb min hoarse wf nad    04/24/2018         185  04/25/2016         168   10/13/15 157 lb 6.4 oz (71.396 kg)  08/10/15 157 lb 6.4 oz (71.396 kg)  07/21/15 156 lb 6.4 oz (70.943 kg)    Vital signs reviewed - Note on arrival 02 sats  96% on RA      HEENT: nl turbinates bilaterally, and oropharynx. Nl external ear canals without cough reflex - edentulous    NECK :  without JVD/Nodes/TM/ nl carotid upstrokes bilaterally   LUNGS: no acc muscle use,  Nl contour chest which is clear to A and P bilaterally without cough on insp or exp maneuvers   CV:  RRR  no s3 or murmur  or increase in P2, and no edema   ABD:  Obese/ soft and nontender with nl inspiratory excursion in  the supine position. No bruits or organomegaly appreciated, bowel sounds nl  MS:  Nl gait/ ext warm without deformities, calf tenderness, cyanosis or clubbing Frozen R shoulder    SKIN: warm and dry without lesions    NEURO:  alert, approp, nl sensorium with  no motor or cerebellar deficits apparent.               Assessment:

## 2018-04-24 NOTE — Assessment & Plan Note (Signed)
PFT's  08/26/13   FEV1 175 (70 % ) ratio 59  p 10 % improvement from saba   with DLCO  57 % corrects to 54 % for alv volume   10/13/2015  try symbiicort 160 2bid - PFT's  01/25/2016  FEV1 2.13(87 % ) ratio 63  p 7 % improvement from saba p breo/combivent prior to study with DLCO  72/70 % corrects to 65 % for alv volume   - 01/25/2016    changed to stiolto from breo > worse so back to breo but changed to 200 from 100  - Rehab 02/07/16 - stopped 9/6 /17 inconsistent attendance / did not want to continue due to neck pain  - 04/25/2016  After extensive coaching HFA effectiveness =    90% > changed to breo 100 from 200 and use combivent prn   - Spirometry 04/24/2018   Unable to perform - FENO 04/24/2018  =   Unable to perform - 04/24/2018  After extensive coaching inhaler device,  effectiveness =    90% with smi and dpi from baseline 75%   Whether we call this copd gold I with asthma component  or mild chronic asthma or ACOS the rx is all the same = BREO 100 or equivalent and prn combivent.   Advised pt: Formulary restrictions will be an ongoing challenge for the forseable future and I would be happy to pick an alternative if the pt will first  provide me a list of them but pt  will need to return here for training for any new device that is required eg dpi vs hfa vs respimat.    In meantime we can always provide samples so the patient never runs out of any needed respiratory medications.

## 2018-04-24 NOTE — Patient Instructions (Addendum)
Plan A = Automatic =  Breo one click each am x 2 good smooth deep drags   Plan B = Backup Only use your albuterol /ipatropium (combivent) as a rescue medication to be used if you can't catch your breath by resting or doing a relaxed purse lip breathing pattern.  - The less you use it, the better it will work when you need it. - Ok to use up to 1 puffs  every 4 hours if you must but call for immediate appointment if use goes up over your usual need - Don't leave home without it !!  (think of it like the spare tire for your car)    Plan C = Crisis - only use your albuterol nebulizer if you first try Plan B and it fails to help > ok to use the nebulizer up to every 4 hours but if start needing it regularly call for immediate appointment       Please schedule a follow up office visit in 4 weeks, sooner if needed - bring your list of alternatives for BREO from your drug formulary

## 2018-04-24 NOTE — Assessment & Plan Note (Signed)
cpst  08/10/16: Conclusion: Exercise testing with gas exchange demonstrates a mild functional impairment when compared to matched sedentary norms. There is likely a mild circulatory limitation factor playing a role in exercise intolerance, given low PVO2 in combination with low OUES and low/blunted O2/pulse response (considering only mild chronotropic incompetence). Given pre-exercise spirometry, patient most likely primary limited due to pulmonary obstructive physiology. Patient's body habitus is contributing to her dyspnea and exercise intolerance. Patient is significantly deconditioned and may benefit from routine/monitored exercise program. - 04/24/2018   Walked RA x one lap @ 185 stopped due to  Sob at slow pace, no desats   Advised 02 very unlikely to help her and needs to consider rehab program, wt loss if not improved at f/u.    I had an extended discussion with the patient reviewing all relevant studies completed to date and  lasting 25 minutes of a 40  minute office visit to re-establish  re  severe non-specific but potentially very serious refractory respiratory symptoms of uncertain and potentially multiple etiologies.  See device teaching which extended face to face time for this visit   Each maintenance medication was reviewed in detail including most importantly the difference between maintenance and prns and under what circumstances the prns are to be triggered using an action plan format that is not reflected in the computer generated alphabetically organized AVS.    Please see AVS for specific instructions unique to this office visit that I personally wrote and verbalized to the the pt in detail and then reviewed with pt  by my nurse highlighting any changes in therapy/plan of care  recommended at today's visit.

## 2018-04-24 NOTE — Assessment & Plan Note (Signed)
Allergy profile  01/25/16  >  Eos 0.2/  IgE  192  POS RAST severe allergies cat > dog/ grass/trees/ ragweed   She does have an element of seasonal flares but this is probably treatable just with standard ics/laba and if not can add singulair next spring or very short course of po prednisone if breaks thru breo 100 rather than use the 200 strength when it's not really needed and likely to aggravate any upper airway component she has to her asthma.

## 2018-05-13 ENCOUNTER — Ambulatory Visit: Payer: Medicaid Other | Admitting: Internal Medicine

## 2018-06-03 ENCOUNTER — Ambulatory Visit (INDEPENDENT_AMBULATORY_CARE_PROVIDER_SITE_OTHER): Payer: Medicaid Other | Admitting: Internal Medicine

## 2018-06-03 ENCOUNTER — Encounter: Payer: Self-pay | Admitting: Internal Medicine

## 2018-06-03 VITALS — BP 124/90 | HR 99 | Ht 62.0 in | Wt 189.0 lb

## 2018-06-03 DIAGNOSIS — J449 Chronic obstructive pulmonary disease, unspecified: Secondary | ICD-10-CM | POA: Diagnosis not present

## 2018-06-03 MED ORDER — GLYCOPYRROLATE-FORMOTEROL 9-4.8 MCG/ACT IN AERO: 2.0000 | INHALATION_SPRAY | Freq: Two times a day (BID) | RESPIRATORY_TRACT | 0 refills | Status: DC

## 2018-06-03 NOTE — Progress Notes (Signed)
Subjective:    Patient ID: [name], female   DOB: March 01, 1956,     MRN: [id number]    Brief patient profile:  80 yowf quit smoking 1999 with cough that resolved and eval by Dr Delford Field in [place] with dx GOLD I copd  Vs AB assoc  seasonal allergy.   History of Present Illness  10/13/2015 1st  office visit/ [facility]  rx  Combivent/ no saba  Chief Complaint  Patient presents with  . Follow-up    Former patient of Dr Delford Field. She c/o increased SOB and cough x 2 wks. She states that she is SOB with or without any exertion. She does not have a rescue inhaler.   not using 02 at all since 1st Feb 2017  Doe x [facility] = can't walk a nl pace on a flat grade s sob More cough at hs esp x 2 weeks  rec Plan A = Automatic = Symbicort 160 Take 2 puffs first thing in am and then another 2 puffs about 12 hours later.  Plan B = Backup Only use your combivent as a rescue medication    01/25/2016  f/u ov/Anjenette Gerbino re: GOLD I copd / breo 200 and combivent x 2 pffs  Chief Complaint  Patient presents with  . Follow-up    Pt states her breathing is about the same. She has had minimal cough- prod with clear sputum.    doe = MMRC1 = can walk nl pace, flat grade, can't hurry or go uphills or steps s sob   Wants to know if allergic to cats, considering buying one  No noct cough/ no copd flares since last ov  rec Plan A = Automatic = Stiolto 2 puffs each am instead of Breo  Plan B = Backup Only use your combivent as a rescue medication  Please remember to go to the lab  department downstairs for your tests - we will call you with the results when they are available.     04/25/2016  f/u ov/Cheryll Keisler re: GOLD I  On breo 200 and prn combivent Chief Complaint  Patient presents with  . Follow-up    Breathing was worse with stiolto, so started back on breo. She had been doing better until a few days ago, started having increased SOB and sneezing- relates to allergies. She is using combivent 2 x per day on average.   confused  re approp use of prns and on breo 200 now  rec Plan A = Automatic = Breo  ONE CLICK each am x 2 drags  Plan B = Backup Only use your combivent s a rescue medication    04/24/2018  f/u ov/Eliel Dudding re:  GOLD I  Chief Complaint  Patient presents with  . Acute Visit    Pt states her breathing has been worse since she ran stopped Breo 2 months ago. She states her insurance stopped covering med. She is using combivent 3 x daily on average.   Dyspnea:  MMRC3 = can't walk 100 yards even at a slow pace at a flat grade s stopping due to sob   Cough: dry cough/ mostly daytime Sleeping: flat bed/ 2 pillows  SABA use: variably over using combivent since ran out of breo / prednisone for shoulder helped the breathing only a little, may have helped the cough more  02:  2lpm at bedtime and as needed but not desated   rec Plan A = Automatic =  Breo one click each am x 2 good smooth  deep drags  Plan B = Backup Only use your albuterol /ipatropium (combivent) as a rescue medication Plan C = Crisis - only use your albuterol nebulizer if you first try Plan B      06/03/2018  f/u ov/Danuta Huseman re: GOLD I  / still on [facility]  Chief Complaint  Patient presents with  . Follow-up    Breathing has been worse "since I went off combivent" she is now using it about 2 x per day.   Dyspnea:  MMRC3 = can't walk 100 yards even at a slow pace at a flat grade s stopping due to sob   Cough: more x 10 days / more with walking / min mucoid production Sleeping: 30 degrees due to shoulder  SABA use: twice daily  - not was told she could take the combivent up to every 4 h if needed 02: 2lpm and prn daytime    No obvious day to day or daytime variability or assoc excess/ purulent sputum or mucus plugs or hemoptysis or cp or chest tightness, subjective wheeze or overt sinus or hb symptoms.   Sleeping as above  without nocturnal  or early am exacerbation  of respiratory  c/o's or need for noct saba. Also denies any obvious fluctuation of  symptoms with weather or environmental changes or other aggravating or alleviating factors except as outlined above   No unusual exposure hx or h/o childhood pna/ asthma or knowledge of premature birth.  Current Allergies, Complete Past Medical History, Past Surgical History, Family History, and Social History were reviewed in [employer] record.  ROS  The following are not active complaints unless bolded Hoarseness, sore throat, dysphagia, dental problems, itching, sneezing,  nasal congestion or discharge of excess mucus or purulent secretions, ear ache,   fever, chills, sweats, unintended wt loss or wt gain, classically pleuritic or exertional cp,  orthopnea pnd or arm/hand swelling  or leg swelling, presyncope, palpitations, abdominal pain, anorexia, nausea, vomiting, diarrhea  or change in bowel habits or change in bladder habits, change in stools or change in urine, dysuria, hematuria,  rash, arthralgias, visual complaints, headache, numbness, weakness or ataxia or problems with walking or coordination,  change in mood or  memory.        Current Meds  Medication Sig  . Cariprazine HCl (VRAYLAR PO) Take 1 tablet by mouth daily.  . fluticasone furoate-vilanterol (BREO ELLIPTA) 100-25 MCG/INH AEPB Inhale 1 puff into the lungs daily.  . furosemide (LASIX) 20 MG tablet Take 1 tablet (20 mg total) by mouth daily.  Marland Kitchen guaiFENesin (MUCINEX) 600 MG 12 hr tablet Take 600 mg by mouth 2 (two) times daily.  . indomethacin (INDOCIN SR) 75 MG CR capsule Take 75 mg by mouth 2 (two) times daily.  . Ipratropium-Albuterol (COMBIVENT RESPIMAT) 20-100 MCG/ACT AERS respimat One puff up to every 4 hours as needed  . LORazepam (ATIVAN) 0.5 MG tablet Take 1 tablet by mouth daily as needed.  . methocarbamol (ROBAXIN) 500 MG tablet Take 500 mg by mouth 4 (four) times daily.  . mometasone (NASONEX) 50 MCG/ACT nasal spray Place 2 sprays into the nose daily.  Marland Kitchen omeprazole (PRILOSEC) 20 MG capsule  Take 1 capsule by mouth daily.  . OXYGEN Inhale into the lungs.  . polyethylene glycol (MIRALAX / GLYCOLAX) packet Take 17 g by mouth daily.  Marland Kitchen PREMPRO 0.3-1.5 MG per tablet Take 1 tablet by mouth daily.  . Suvorexant (BELSOMRA) 15 MG TABS Take 1 tablet by mouth daily.  Marland Kitchen  zolpidem (AMBIEN) 10 MG tablet Take 10 mg by mouth at bedtime as needed.               Objective:   Physical Exam    amb wf nad   06/03/2018        189  04/24/2018         185  04/25/2016         168   10/13/15 157 lb 6.4 oz (71.396 kg)  08/10/15 157 lb 6.4 oz (71.396 kg)  07/21/15 156 lb 6.4 oz (70.943 kg)     Vital signs reviewed - Note on arrival 02 sats   97 % on RA    HEENT: Edentulous,  nl oropharynx. Nl external ear canals without cough reflex -  Mild bilateral non-specific turbinate edema     NECK :  without JVD/Nodes/TM/ nl carotid upstrokes bilaterally   LUNGS: no acc muscle use,  Mild barrel  contour chest wall with bilateral  Distant bs s audible wheeze and  without cough on insp or exp maneuver and mild  Hyperresonant  to  percussion bilaterally     CV:  RRR  no s3 or murmur or increase in P2, and no edema   ABD:  soft and nontender with pos late insp Hoover's  in the supine position. No bruits or organomegaly appreciated, bowel sounds nl  MS:   Nl gait/  ext warm without deformities, calf tenderness, cyanosis or clubbing R shoulder frozen   SKIN: warm and dry without lesions    NEURO:  alert, approp, nl sensorium with  no motor or cerebellar deficits apparent.             Assessment:

## 2018-06-03 NOTE — Patient Instructions (Addendum)
Plan A = Automatic = Bevespi Take 2 puffs first thing in am and then another 2 puffs about 12 hours later.   Work on inhaler technique:  relax and gently blow all the way out then take a nice smooth deep breath back in, triggering the inhaler at same time you start breathing in.  Hold for up to 5 seconds if you can. Blow out thru nose. Rinse and gargle with water when done    Plan B - Backup  -   ok to use the nebulizer up to every 4 hours if you must but if start needing it regularly call for immediate appointment   Please schedule a follow up office visit in 4 weeks, sooner if needed

## 2018-06-04 ENCOUNTER — Encounter: Payer: Self-pay | Admitting: Internal Medicine

## 2018-06-04 NOTE — Assessment & Plan Note (Signed)
-  PFT's  08/26/13   FEV1 175 (70 % ) ratio 59  p 10 % improvement from saba   with DLCO  57 % corrects to 54 % for alv volume   10/13/2015  try symbiicort 160 2bid - PFT's  01/25/2016  FEV1 2.13(87 % ) ratio 63  p 7 % improvement from saba p breo/combivent prior to study with DLCO  72/70 % corrects to 65 % for alv volume   - 01/25/2016    changed to stiolto from breo > worse so back to breo but changed to 200 from 100  - Rehab 02/07/16 - stopped 9/6 /17 inconsistent attendance / did not want to continue due to neck pain  - 04/25/2016  After extensive coaching HFA effectiveness =    90% > changed to breo 100 from 200 and use combivent prn   - Spirometry 04/24/2018   Unable to perform - FENO 04/24/2018  =   Unable to perform  - 06/03/2018  After extensive coaching inhaler device,  effectiveness =    75% with hfa > try bevespi    Pt is Group B in terms of symptom/risk and laba/lama therefore appropriate rx at this point and change from sama/saba to just saba prn   Discussed following I spent extra time with pt today reviewing appropriate use of albuterol for prn use on exertion with the following points: 1) saba is for relief of sob that does not improve by walking a slower pace or resting but rather if the pt does not improve after trying this first. 2) If the pt is convinced, as many are, that saba helps recover from activity faster then it's easy to tell if this is the case by re-challenging : ie stop, take the inhaler, then p 5 minutes try the exact same activity (intensity of workload) that just caused the symptoms and see if they are substantially diminished or not after saba 3) if there is an activity that reproducibly causes the symptoms, try the saba 15 min before the activity on alternate days   If in fact the saba really does help, then fine to continue to use it prn but advised may need to look closer at the maintenance regimen being used to achieve better control of airways disease with  exertion.   >>>   Formulary restrictions will be an ongoing challenge for the forseable future and I would be happy to pick an alternative if the pt will first  provide me a list of them but pt  will need to return here for training for any new device that is required eg dpi vs hfa vs respimat.    In meantime we can always provide samples so the patient never runs out of any needed respiratory medications.     .I had an extended discussion with the patient reviewing all relevant studies completed to date and  lasting 15 to 20 minutes of a 25 minute visit    See device teaching which extended face to face time for this visit.  Each maintenance medication was reviewed in detail including emphasizing most importantly the difference between maintenance and prns and under what circumstances the prns are to be triggered using an action plan format that is not reflected in the computer generated alphabetically organized AVS which I have not found useful in most complex patients, especially with respiratory illnesses  Please see AVS for specific instructions unique to this visit that I personally wrote and verbalized to the the  pt in detail and then reviewed with pt  by my nurse highlighting any  changes in therapy recommended at today's visit to their plan of care.

## 2018-06-09 NOTE — Progress Notes (Signed)
@Patient  ID: [name], female    DOB: [birth year], 62 y.o.   MRN: [id number]  Chief Complaint  Patient presents with  . Acute Visit    SOB     Referring provider: Blane Ohara, MD  HPI:  62 year old female former smoker followed in our office for COPD Gold II  PMH: Depression, anxiety, chronic kidney disease stage II Smoker/ Smoking History: Former smoker.  Quit 1999.  15 pack years Maintenance: Bevespi Pt of: Wert  06/10/2018  - Visit   62 year old female patient presenting today for 3 days of increased shortness of breath, increased cough, chest tightness, increased sputum production, sputum color change of yellow to green.  Patient reports adherence to [facility], has increased her Sharolyn Douglas use to every 4 hours.  Patient reports she has tried to limit her Combivent use over the last 3 to 4 days under Dr. Thurston Hole instructions after starting Bevespi.  Patient reports she has not used her Combivent in the last week.  Patient also admits that she has ongoing depression and is currently being managed by primary care.  She also has ongoing anxiety being managed by primary care.  Patient reports that she knows that this can sometimes worsen her respiratory symptoms.  MMRC - Breathlessness Score 4 - I am too breathless to leave the house or I am breathlessness when dressing    FENO:  No results found for: NITRICOXIDE  PFT: PFT Results Latest Ref Rng & Units 01/25/2016 08/26/2013  FVC-Pre L 3.28 2.76  FVC-Predicted Pre % 104 86  FVC-Post L 3.41 2.97  FVC-Predicted Post % 108 93  Pre FEV1/FVC % % 60 57  Post FEV1/FCV % % 63 59  FEV1-Pre L 1.98 1.58  FEV1-Predicted Pre % 81 63  FEV1-Post L 2.13 1.75  DLCO UNC% % 72 57  DLCO COR %Predicted % 65 54  TLC L 5.54 5.45  TLC % Predicted % 116 114  RV % Predicted % 102 134    Imaging: No results found.  Chart Review:    Specialty Problems      Pulmonary Problems   Emphysema/COPD Gold B    PFTs 08/27/2013: moderate obstruction,  air trapping, reduced DLCO      COPD GOLD I    -  PFT's  08/26/13   FEV1 175 (70 % ) ratio 59  p 10 % improvement from saba   with DLCO  57 % corrects to 54 % for alv volume   10/13/2015  try symbiicort 160 2bid - PFT's  01/25/2016  FEV1 2.13(87 % ) ratio 63  p 7 % improvement from saba p breo/combivent prior to study with DLCO  72/70 % corrects to 65 % for alv volume   - 01/25/2016    changed to stiolto from breo > worse so back to breo but changed to 200 from 100  - Rehab 02/07/16 - stopped 9/6 /17 inconsistent attendance / did not want to continue due to neck pain  - 04/25/2016  After extensive coaching HFA effectiveness =    90% > changed to breo 100 from 200 and use combivent prn   - Spirometry 04/24/2018   Unable to perform - FENO 04/24/2018  =   Unable to perform - 06/03/2018  After extensive coaching inhaler device,  effectiveness =    75% with hfa > try bevespi       DOE (dyspnea on exertion)    cpst  08/10/16: Conclusion: Exercise testing with gas exchange demonstrates a mild  functional impairment when compared to matched sedentary norms. There is likely a mild circulatory limitation factor playing a role in exercise intolerance, given low PVO2 in combination with low OUES and low/blunted O2/pulse response (considering only mild chronotropic incompetence). Given pre-exercise spirometry, patient most likely primary limited due to pulmonary obstructive physiology. Patient's body habitus is contributing to her dyspnea and exercise intolerance. Patient is significantly deconditioned and may benefit from routine/monitored exercise program. - 04/24/2018   Walked RA x one lap @ 185 stopped due to  Sob at slow pace, no desats          Allergies  Allergen Reactions  . Azithromycin Nausea And Vomiting    Immunization History  Administered Date(s) Administered  . Influenza,inj,Quad PF,6+ Mos 08/26/2013, 05/18/2014, 05/13/2015, 04/25/2016, 04/24/2018  . Pneumococcal Polysaccharide-23 08/18/2013     Past Medical History:  Diagnosis Date  . Arthritis   . Asthma    Breo and Combivent daily  . CHF (congestive heart failure) (HCC)    takes Lasix daily  . COPD (chronic obstructive pulmonary disease) (HCC)   . Depression    takes Wellbutrin and Citalopram daily  . Emphysema lung (HCC)   . GERD (gastroesophageal reflux disease)    takes Omeprazole daily  . Glaucoma    mild"  . History of blood transfusion 1983   no abnormal reaction noted  . History of bronchitis 5-6wks ago  . Joint swelling   . Mood disorder (HCC)   . Multiple allergies    Fluticasone and Mucinex daily  . Peripheral neuropathy   . Pneumonia under the age of 10   hx of  . Vertigo    takes Meclizine daily as needed  . Weakness    numbness and tingling in right arm and leg    Tobacco History: Social History   Tobacco Use  Smoking Status Former Smoker  . Packs/day: 1.00  . Years: 15.00  . Pack years: 15.00  . Types: Cigarettes  . Last attempt to quit: 07/30/1997  . Years since quitting: 20.8  Smokeless Tobacco Never Used   Counseling given: Yes  Continue to not smoke  Outpatient Encounter Medications as of 06/10/2018  Medication Sig  . Cariprazine HCl (VRAYLAR PO) Take 1 tablet by mouth daily.  . furosemide (LASIX) 20 MG tablet Take 1 tablet (20 mg total) by mouth daily.  . Glycopyrrolate-Formoterol (BEVESPI AEROSPHERE) 9-4.8 MCG/ACT AERO Inhale 2 puffs into the lungs 2 (two) times daily.  Marland Kitchen guaiFENesin (MUCINEX) 600 MG 12 hr tablet Take 600 mg by mouth 2 (two) times daily.  . indomethacin (INDOCIN SR) 75 MG CR capsule Take 75 mg by mouth 2 (two) times daily.  . Ipratropium-Albuterol (COMBIVENT RESPIMAT) 20-100 MCG/ACT AERS respimat One puff up to every 4 hours as needed  . LORazepam (ATIVAN) 0.5 MG tablet Take 1 tablet by mouth daily as needed.  . methocarbamol (ROBAXIN) 500 MG tablet Take 500 mg by mouth 4 (four) times daily.  Marland Kitchen omeprazole (PRILOSEC) 20 MG capsule Take 1 capsule by mouth  daily.  . OXYGEN Inhale into the lungs.  . polyethylene glycol (MIRALAX / GLYCOLAX) packet Take 17 g by mouth daily.  Marland Kitchen PREMPRO 0.3-1.5 MG per tablet Take 1 tablet by mouth daily.  . Suvorexant (BELSOMRA) 15 MG TABS Take 1 tablet by mouth daily.  Marland Kitchen zolpidem (AMBIEN) 10 MG tablet Take 10 mg by mouth at bedtime as needed.  Marland Kitchen albuterol (PROVENTIL HFA;VENTOLIN HFA) 108 (90 Base) MCG/ACT inhaler Inhale 2 puffs into the lungs  every 6 (six) hours as needed for wheezing or shortness of breath.  . doxycycline (VIBRA-TABS) 100 MG tablet Take 1 tablet (100 mg total) by mouth 2 (two) times daily.  . fluticasone furoate-vilanterol (BREO ELLIPTA) 100-25 MCG/INH AEPB Inhale 1 puff into the lungs daily. (Patient not taking: Reported on 06/10/2018)  . mometasone (NASONEX) 50 MCG/ACT nasal spray Place 2 sprays into the nose daily. (Patient not taking: Reported on 06/10/2018)  . predniSONE (DELTASONE) 10 MG tablet 4 tabs for 2 days, then 3 tabs for 2 days, 2 tabs for 2 days, then 1 tab for 2 days, then stop   No facility-administered encounter medications on file as of 06/10/2018.      Review of Systems  Review of Systems  Constitutional: Positive for fatigue. Negative for chills, fever and unexpected weight change.  HENT: Positive for congestion, postnasal drip, sinus pressure and sinus pain. Negative for ear pain.   Respiratory: Positive for cough (Green thick mucus), chest tightness and shortness of breath. Negative for wheezing.   Cardiovascular: Negative for chest pain and palpitations.  Gastrointestinal: Negative for blood in stool, diarrhea, nausea and vomiting.  Genitourinary: Negative for dysuria, frequency and urgency.  Musculoskeletal: Negative for arthralgias.  Skin: Negative for color change.  Allergic/Immunologic: Negative for environmental allergies and food allergies.  Neurological: Negative for dizziness, light-headedness and headaches.  Psychiatric/Behavioral: Positive for dysphoric mood  (Chronic issue). The patient is nervous/anxious (Chronic issue).   All other systems reviewed and are negative.    Physical Exam  BP 122/72 (BP Location: Left Arm, Cuff Size: Normal)   Pulse (!) 118   Temp 98.7 F (37.1 C) (Oral)   Ht 5\' 2"  (1.575 m)   Wt 189 lb 9.6 oz (86 kg)   SpO2 95%   BMI 34.68 kg/m   Wt Readings from Last 5 Encounters:  06/10/18 189 lb 9.6 oz (86 kg)  06/03/18 189 lb (85.7 kg)  04/24/18 185 lb (83.9 kg)  08/24/16 183 lb (83 kg)  07/24/16 178 lb 9.6 oz (81 kg)     Physical Exam  Constitutional: She is oriented to person, place, and time and well-developed, well-nourished, and in no distress. No distress.  HENT:  Head: Normocephalic and atraumatic.  Right Ear: Hearing, external ear and ear canal normal.  Left Ear: Hearing, external ear and ear canal normal.  Nose: Mucosal edema present. Right sinus exhibits no maxillary sinus tenderness and no frontal sinus tenderness. Left sinus exhibits no maxillary sinus tenderness and no frontal sinus tenderness.  Mouth/Throat: Uvula is midline and oropharynx is clear and moist. No oropharyngeal exudate.  + TMs with effusions bilaterally without infection  Eyes: Pupils are equal, round, and reactive to light.  Neck: Normal range of motion. Neck supple. No JVD present.  Cardiovascular: Regular rhythm and normal heart sounds. Tachycardia present.  Pulmonary/Chest: Effort normal. No accessory muscle usage. No respiratory distress. She has no decreased breath sounds. She has wheezes (Inspiratory and expiratory wheezes). She has no rhonchi.  Musculoskeletal: Normal range of motion. She exhibits no edema.  Lymphadenopathy:    She has no cervical adenopathy.  Neurological: She is alert and oriented to person, place, and time. Gait normal.  Skin: Skin is warm and dry. She is not diaphoretic. No erythema.  Psychiatric: Memory and judgment normal. Her mood appears anxious. She exhibits a depressed mood. She expresses no  homicidal and no suicidal ideation. She expresses no suicidal plans and no homicidal plans. She has a flat affect.  Nursing note  and vitals reviewed.     Lab Results:  CBC    Component Value Date/Time   WBC 9.4 07/18/2016 2223   RBC 4.63 07/18/2016 2223   HGB 14.4 07/18/2016 2223   HCT 43.1 07/18/2016 2223   PLT 242 07/18/2016 2223   MCV 93.1 07/18/2016 2223   MCH 31.1 07/18/2016 2223   MCHC 33.4 07/18/2016 2223   RDW 13.7 07/18/2016 2223   LYMPHSABS 4.0 01/25/2016 1419   MONOABS 1.0 01/25/2016 1419   EOSABS 0.2 01/25/2016 1419   BASOSABS 0.0 01/25/2016 1419    BMET    Component Value Date/Time   NA 139 07/18/2016 2223   K 4.0 07/18/2016 2223   CL 108 07/18/2016 2223   CO2 26 07/18/2016 2223   GLUCOSE 93 07/18/2016 2223   BUN 14 07/18/2016 2223   CREATININE 1.03 (H) 07/18/2016 2223   CALCIUM 9.2 07/18/2016 2223   GFRNONAA 59 (L) 07/18/2016 2223   GFRAA >60 07/18/2016 2223    BNP No results found for: BNP  ProBNP    Component Value Date/Time   PROBNP 627.6 (H) 07/31/2013 0653      Assessment & Plan:   62 year old female patient completing acute visit with our office today.  Patient with COPD exacerbation.  Will treat with prednisone and antibiotics.  We will bring patient back in the short-term to ensure that she is improving.  I do believe that there is an aspect of depression and anxiety that are worsening some of her symptoms.  Patient to follow back up with primary care as I believe medication management is appropriate also patient should take primary care upon the referral to be established with a therapist as well for further management of anxiety and depression.  Patient agrees.  Patient to follow back up with our office in the next 2 to 4 weeks to ensure symptoms are improving.  Emphysema/COPD Gold B Prednisone 10mg  tablet  >>>4 tabs for 2 days, then 3 tabs for 2 days, 2 tabs for 2 days, then 1 tab for 2 days, then stop >>>take with food  >>>take in  the morning   Doxycycline >>> 1 100 mg tablet every 12 hours for 5 days >>>take with food  >>>wear sunscreen   Bevespi Aerosphere inhaler >>>2 puffs daily twice a day (4 puffs total daily) >>>This is not a rescue inhaler >>>You take this daily no matter what  Only use your albuterol nebulizer as a rescue medication to be used if you can't catch your breath by resting or doing a relaxed purse lip breathing pattern.  - The less you use it, the better it will work when you need it. - Ok to use up to 2 puffs  every 4 hours if you must but call for immediate appointment if use goes up over your usual need - Don't leave home without it !!  (think of it like the spare tire for your car)   Follow up in 2-4 weeks   Note your daily symptoms > remember "red flags" for COPD:   >>>Increase in cough >>>increase in sputum production >>>increase in shortness of breath or activity  intolerance.   If you notice these symptoms, please call the office to be seen.   Anxiety Follow-up with primary care regarding your mood, take them up on the referral to see a therapist.  I believe that could benefit you     Coral Ceo, NP 06/10/2018

## 2018-06-10 ENCOUNTER — Encounter: Payer: Self-pay | Admitting: Pulmonary Disease

## 2018-06-10 ENCOUNTER — Ambulatory Visit (INDEPENDENT_AMBULATORY_CARE_PROVIDER_SITE_OTHER): Payer: Medicaid Other | Admitting: Pulmonary Disease

## 2018-06-10 VITALS — BP 122/72 | HR 118 | Temp 98.7°F | Ht 62.0 in | Wt 189.6 lb

## 2018-06-10 DIAGNOSIS — J439 Emphysema, unspecified: Secondary | ICD-10-CM | POA: Diagnosis not present

## 2018-06-10 DIAGNOSIS — F419 Anxiety disorder, unspecified: Secondary | ICD-10-CM | POA: Diagnosis not present

## 2018-06-10 MED ORDER — ALBUTEROL SULFATE HFA 108 (90 BASE) MCG/ACT IN AERS: 2.0000 | INHALATION_SPRAY | Freq: Four times a day (QID) | RESPIRATORY_TRACT | 6 refills | Status: DC | PRN

## 2018-06-10 MED ORDER — PREDNISONE 10 MG PO TABS: ORAL_TABLET | ORAL | 0 refills | Status: DC

## 2018-06-10 MED ORDER — DOXYCYCLINE HYCLATE 100 MG PO TABS: 100.0000 mg | ORAL_TABLET | Freq: Two times a day (BID) | ORAL | 0 refills | Status: DC

## 2018-06-10 NOTE — Assessment & Plan Note (Signed)
Follow-up with primary care regarding your mood, take them up on the referral to see a therapist.  I believe that could benefit you

## 2018-06-10 NOTE — Patient Instructions (Addendum)
Prednisone 10mg  tablet  >>>4 tabs for 2 days, then 3 tabs for 2 days, 2 tabs for 2 days, then 1 tab for 2 days, then stop >>>take with food  >>>take in the morning   Doxycycline >>> 1 100 mg tablet every 12 hours for 5 days >>>take with food  >>>wear sunscreen   Bevespi Aerosphere inhaler >>>2 puffs daily twice a day (4 puffs total daily) >>>This is not a rescue inhaler >>>You take this daily no matter what  Only use your albuterol nebulizer as a rescue medication to be used if you can't catch your breath by resting or doing a relaxed purse lip breathing pattern.  - The less you use it, the better it will work when you need it. - Ok to use up to 2 puffs  every 4 hours if you must but call for immediate appointment if use goes up over your usual need - Don't leave home without it !!  (think of it like the spare tire for your car)   Follow up in 2-4 weeks   Note your daily symptoms > remember "red flags" for COPD:   >>>Increase in cough >>>increase in sputum production >>>increase in shortness of breath or activity  intolerance.   If you notice these symptoms, please call the office to be seen.     Follow-up with primary care and discuss need to see a therapist     November/2019 we will be moving! We will no longer be at our [place] location.  Be on the look out for a post card/mailer to let you know we have officially moved.  Our new address and phone number will be:  [address] [place], [place] [address] Telephone number: [phone]  It is flu season:   >>>Remember to be washing your hands regularly, using hand sanitizer, be careful to use around herself with has contact with people who are sick will increase her chances of getting sick yourself. >>> Best ways to protect herself from the flu: Receive the yearly flu vaccine, practice good hand hygiene washing with soap and also using hand sanitizer when available, eat a nutritious meals, get adequate rest,  hydrate appropriately   Please contact the office if your symptoms worsen or you have concerns that you are not improving.   Thank you for choosing [facility] for your healthcare, and for allowing [name] to partner with you on your healthcare journey. I am thankful to be able to provide care to you today.   Elisha Headland FNP-C    Chronic Obstructive Pulmonary Disease Chronic obstructive pulmonary disease (COPD) is a long-term (chronic) lung problem. When you have COPD, it is hard for air to get in and out of your lungs. The way your lungs work will never return to normal. Usually the condition gets worse over time. There are things you can do to keep yourself as healthy as possible. Your doctor may treat your condition with:  Medicines.  Quitting smoking, if you smoke.  Rehabilitation. This may involve a team of specialists.  Oxygen.  Exercise and changes to your diet.  Lung surgery.  Comfort measures (palliative care).  Follow these instructions at home: Medicines  Take over-the-counter and prescription medicines only as told by your doctor.  Talk to your doctor before taking any cough or allergy medicines. You may need to avoid medicines that cause your lungs to be dry. Lifestyle  If you smoke, stop. Smoking makes the problem worse. If you need  help quitting, ask your doctor.  Avoid being around things that make your breathing worse. This may include smoke, chemicals, and fumes.  Stay active, but remember to also rest.  Learn and use tips on how to relax.  Make sure you get enough sleep. Most adults need at least 7 hours a night.  Eat healthy foods. Eat smaller meals more often. Rest before meals. Controlled breathing  Learn and use tips on how to control your breathing as told by your doctor. Try: ? Breathing in (inhaling) through your nose for 1 second. Then, pucker your lips and breath out (exhale) through your lips for 2 seconds. ? Putting one hand on  your belly (abdomen). Breathe in slowly through your nose for 1 second. Your hand on your belly should move out. Pucker your lips and breathe out slowly through your lips. Your hand on your belly should move in as you breathe out. Controlled coughing  Learn and use controlled coughing to clear mucus from your lungs. The steps are: 1. Lean your head a little forward. 2. Breathe in deeply. 3. Try to hold your breath for 3 seconds. 4. Keep your mouth slightly open while coughing 2 times. 5. Spit any mucus out into a tissue. 6. Rest and do the steps again 1 or 2 times as needed. General instructions  Make sure you get all the shots (vaccines) that your doctor recommends. Ask your doctor about a flu shot and a pneumonia shot.  Use oxygen therapy and therapy to help improve your lungs (pulmonary rehabilitation) if told by your doctor. If you need home oxygen therapy, ask your doctor if you should buy a tool to measure your oxygen level (oximeter).  Make a COPD action plan with your doctor. This helps you know what to do if you feel worse than usual.  Manage any other conditions you have as told by your doctor.  Avoid going outside when it is very hot, cold, or humid.  Avoid people who have a sickness you can catch (contagious).  Keep all follow-up visits as told by your doctor. This is important. Contact a doctor if:  You cough up more mucus than usual.  There is a change in the color or thickness of the mucus.  It is harder to breathe than usual.  Your breathing is faster than usual.  You have trouble sleeping.  You need to use your medicines more often than usual.  You have trouble doing your normal activities such as getting dressed or walking around the house. Get help right away if:  You have shortness of breath while resting.  You have shortness of breath that stops you from: ? Being able to talk. ? Doing normal activities.  Your chest hurts for longer than 5  minutes.  Your skin color is more blue than usual.  Your pulse oximeter shows that you have low oxygen for longer than 5 minutes.  You have a fever.  You feel too tired to breathe normally. Summary  Chronic obstructive pulmonary disease (COPD) is a long-term lung problem.  The way your lungs work will never return to normal. Usually the condition gets worse over time. There are things you can do to keep yourself as healthy as possible.  Take over-the-counter and prescription medicines only as told by your doctor.  If you smoke, stop. Smoking makes the problem worse. This information is not intended to replace advice given to you by your health care provider. Make sure you discuss any questions  you have with your health care provider. Document Released: 01/02/2008 Document Revised: 12/22/2015 Document Reviewed: 03/12/2013 Elsevier Interactive Patient Education  2017 [employer].

## 2018-06-10 NOTE — Assessment & Plan Note (Signed)
Prednisone 10mg  tablet  >>>4 tabs for 2 days, then 3 tabs for 2 days, 2 tabs for 2 days, then 1 tab for 2 days, then stop >>>take with food  >>>take in the morning   Doxycycline >>> 1 100 mg tablet every 12 hours for 5 days >>>take with food  >>>wear sunscreen   Bevespi Aerosphere inhaler >>>2 puffs daily twice a day (4 puffs total daily) >>>This is not a rescue inhaler >>>You take this daily no matter what  Only use your albuterol nebulizer as a rescue medication to be used if you can't catch your breath by resting or doing a relaxed purse lip breathing pattern.  - The less you use it, the better it will work when you need it. - Ok to use up to 2 puffs  every 4 hours if you must but call for immediate appointment if use goes up over your usual need - Don't leave home without it !!  (think of it like the spare tire for your car)   Follow up in 2-4 weeks   Note your daily symptoms > remember "red flags" for COPD:   >>>Increase in cough >>>increase in sputum production >>>increase in shortness of breath or activity  intolerance.   If you notice these symptoms, please call the office to be seen.

## 2018-06-11 NOTE — Progress Notes (Signed)
Chart and office note reviewed in detail  > agree with a/p as outlined    

## 2018-06-25 ENCOUNTER — Encounter: Payer: Self-pay | Admitting: Internal Medicine

## 2018-06-25 ENCOUNTER — Ambulatory Visit: Payer: Medicaid Other | Admitting: Internal Medicine

## 2018-06-25 ENCOUNTER — Ambulatory Visit (INDEPENDENT_AMBULATORY_CARE_PROVIDER_SITE_OTHER)
Admission: RE | Admit: 2018-06-25 | Discharge: 2018-06-25 | Disposition: A | Discharge status: Home / Self Care | Payer: Medicaid Other | Source: Ambulatory Visit | Attending: Internal Medicine | Admitting: Internal Medicine

## 2018-06-25 VITALS — BP 148/82 | HR 85 | Temp 98.0°F | Ht 62.0 in | Wt 193.6 lb

## 2018-06-25 DIAGNOSIS — J449 Chronic obstructive pulmonary disease, unspecified: Secondary | ICD-10-CM | POA: Diagnosis not present

## 2018-06-25 DIAGNOSIS — R0609 Other forms of dyspnea: Secondary | ICD-10-CM

## 2018-06-25 MED ORDER — FLUTICASONE FUROATE-VILANTEROL 100-25 MCG/INH IN AEPB: 1.0000 | INHALATION_SPRAY | Freq: Every day | RESPIRATORY_TRACT | 0 refills | Status: DC

## 2018-06-25 MED ORDER — GUAIFENESIN-CODEINE 100-10 MG/5ML PO SOLN: 5.0000 mL | Freq: Two times a day (BID) | ORAL | 0 refills | Status: DC | PRN

## 2018-06-25 MED ORDER — PANTOPRAZOLE SODIUM 40 MG PO TBEC: 40.0000 mg | DELAYED_RELEASE_TABLET | Freq: Every day | ORAL | 2 refills | Status: DC

## 2018-06-25 NOTE — Assessment & Plan Note (Addendum)
-  PFT's  08/26/13   FEV1 175 (70 % ) ratio 59  p 10 % improvement from saba   with DLCO  57 % corrects to 54 % for alv volume   - alpha One AT screen  10/16/17  :  MM/ level 128  10/13/2015  try symbiicort 160 2bid - PFT's  01/25/2016  FEV1 2.13  (87 % ) ratio 63  p 7 % improvement from saba p breo/combivent prior to study with DLCO  72/70 % corrects to 65 % for alv volume   - 01/25/2016    changed to stiolto from breo > worse so back to breo but changed to 200 from 100  - Rehab 02/07/16 - stopped 9/6 /17 inconsistent attendance / did not want to continue due to neck pain  - 04/25/2016   > changed again  to breo 100 from 200 and use combivent prn   - Spirometry 04/24/2018   Unable to perform - FENO 04/24/2018  =   Unable to perform - 06/03/2018   try bevespi > d/c'd by pt 06/23/18 due not not working and changed herself back to KamasBreo 100 though not covered by present insurance  06/25/2018  After extensive coaching inhaler device,  effectiveness =    90% with elipta from a baseline of 75%    DDX of  difficult airways management almost all start with A and  include Adherence, Ace Inhibitors, Acid Reflux, Active Sinus Disease, Alpha 1 Antitripsin deficiency, Anxiety masquerading as Airways dz,  ABPA,  Allergy(esp in young), Aspiration (esp in elderly), Adverse effects of meds,  Active smoking or vaping, A bunch of PE's (a small clot burden can't cause this syndrome unless there is already severe underlying pulm or vascular dz with poor reserve) plus two Bs  = Bronchiectasis and Beta blocker use..and one C= CHF a  Adherence is always the initial "prime suspect" and is a multilayered concern that requires a "trust but verify" approach in every patient - starting with knowing how to use medications, especially inhalers, correctly, keeping up with refills and understanding the fundamental difference between maintenance and prns vs those medications only taken for a very short course and then stopped and not refilled.   - repeated instructions on correct use of elipta (needs to hold breath x a min of 5 sec)  ? Acid (or non-acid) GERD > always difficult to exclude as up to 75% of pts in some series report no assoc GI/ Heartburn symptoms> rec max (24h)  acid suppression and diet restrictions/ reviewed and instructions given in writing.  - Of the three most common causes of  Sub-acute / recurrent or chronic cough, only one (GERD)  can actually contribute to/ trigger  the other two (asthma and post nasal drip syndrome)  and perpetuate the cylce of cough.  While not intuitively obvious, many patients with chronic low grade reflux do not cough until there is a primary insult that disturbs the protective epithelial barrier and exposes sensitive nerve endings.   This is typically viral but can due to PNDS and  either may apply here.   The point is that once this occurs, it is difficult to eliminate the cycle  using anything but a maximally effective acid suppression regimen at least in the short run, accompanied by an appropriate diet to address non acid GERD and control / eliminate the cough itself for at least 3 days with codeine cough syrup  ? Adverse effects of DPI > she is  convinced she does better on Breo which of course is a DPI and she says dose better than lama/laba  HFA but note that she never achieved adequate technique with either.  For now I am going to leave her on Breo but note that it is not covered by her insurance at this point. >>>>  Formulary restrictions will be an ongoing challenge for the forseable future and I would be happy to pick an alternative if the pt will first  provide me a list of them but pt  will need to return here for training for any new device that is required eg dpi vs hfa vs respimat. In meantime we can always provide samples so the patient never runs out of any needed respiratory medications.    ? Anxiety/depression  > usually at the bottom of this list of usual suspects but should be  much higher on this pt's based on H and P and note already on psychotropics and may interfere with adherence and also interpretation of response or lack thereof to symptom management which can be quite subjective.   ? Alpha one AT > already excluded as above  ? Adverse effects of meds > Virgel Bouquet is the only "suspect " and was just added 11/25 so is unlikely to be causing any harm so ok to continue for now pending return with formulary and hand.  ? Allergy/ asthma > strongly doubt as she did not respond to recent course of prednisone so no further prednisone was recommended    Chf > bnp so low excludes

## 2018-06-25 NOTE — Patient Instructions (Addendum)
Stop prilosec for now  Start protonix 40 mg Take 30- 60 min before your first and last meals of the day   GERD (REFLUX)  is an extremely common cause of respiratory symptoms just like yours , many times with no obvious heartburn at all.    It can be treated with medication, but also with lifestyle changes including elevation of the head of your bed (ideally with 6 inch  bed blocks),  Smoking cessation, avoidance of late meals, excessive alcohol, and avoid fatty foods, chocolate, peppermint, colas, red wine, and acidic juices such as orange juice.  NO MINT OR MENTHOL PRODUCTS SO NO COUGH DROPS   USE SUGARLESS CANDY INSTEAD (Jolley ranchers or Stover's or Life Savers) or even ice chips will also do - the key is to swallow to prevent all throat clearing. NO OIL BASED VITAMINS - use powdered substitutes.   Plan A = Automatic = BREO 100 one click each am x 2 good drags then rinse and gargle  Plan B = Backup Only use your combivent  inhaler as a rescue medication to be used if you can't catch your breath by resting or doing a relaxed purse lip breathing pattern.  - The less you use it, the better it will work when you need it. - Ok to use the inhaler up to 1 puffs  every 4 hours if you must but call for appointment if use goes up over your usual need - Don't leave home without it !!  (think of it like the spare tire for your car)   Plan C = Crisis - only use your albuterol nebulizer if you first try Plan B and it fails to help > ok to use the nebulizer up to every 4 hours but if start needing it regularly call for immediate appointment   For cough > codeine cough syrup  1-2 tsp every 4 hours with goal = no cough at all    Please remember to go to the lab and x-ray department downstairs in the basement  for your tests - we will call you with the results when they are available.  Please schedule a follow up office visit in 2 weeks, sooner if needed  with all medications /inhalers/ solutions in  hand so we can verify exactly what you are taking. This includes all medications from all doctors and over the counters - also bring your drug formulary for options

## 2018-06-25 NOTE — Progress Notes (Signed)
Subjective:    Patient ID: [name], female   DOB: 09/30/1955,     MRN: [id number]    Brief patient profile:  62 yowf quit smoking 1999 with cough that resolved and eval by Dr Delford Field in [place] with dx GOLD I copd  Vs AB assoc  seasonal allergy.   History of Present Illness  10/13/2015 1st  office visit/ [name]  rx  Combivent/ no saba  Chief Complaint  Patient presents with  . Follow-up    Former patient of Dr Delford Field. She c/o increased SOB and cough x 2 wks. She states that she is SOB with or without any exertion. She does not have a rescue inhaler.   not using 02 at all since 1st Feb 2017  [facility] x [facility] = can't walk a nl pace on a flat grade s sob More cough at hs esp x 2 weeks  rec Plan A = Automatic = Symbicort 160 Take 2 puffs first thing in am and then another 2 puffs about 12 hours later.  Plan B = Backup Only use your combivent as a rescue medication    01/25/2016  f/u ov/Zi Sek re: GOLD I copd / breo 200 and combivent x 2 pffs  Chief Complaint  Patient presents with  . Follow-up    Pt states her breathing is about the same. She has had minimal cough- prod with clear sputum.    doe = MMRC1 = can walk nl pace, flat grade, can't hurry or go uphills or steps s sob   Wants to know if allergic to cats, considering buying one  No noct cough/ no copd flares since last ov  rec Plan A = Automatic = Stiolto 2 puffs each am instead of Breo  Plan B = Backup Only use your combivent as a rescue medication  Please remember to go to the lab  department downstairs for your tests - we will call you with the results when they are available.     04/25/2016  f/u ov/[name] re: GOLD I  On breo 200 and prn combivent Chief Complaint  Patient presents with  . Follow-up    Breathing was worse with stiolto, so started back on breo. She had been doing better until a few days ago, started having increased SOB and sneezing- relates to allergies. She is using combivent 2 x per day on average.   confused  re approp use of prns and on breo 200 now  rec Plan A = Automatic = Breo  ONE CLICK each am x 2 drags  Plan B = Backup Only use your combivent s a rescue medication     04/24/2018  f/u ov/[name] re:  GOLD I  Chief Complaint  Patient presents with  . Acute Visit    Pt states her breathing has been worse since she ran stopped Breo 2 months ago. She states her insurance stopped covering med. She is using combivent 3 x daily on average.   Dyspnea:  MMRC3 = can't walk 100 yards even at a slow pace at a flat grade s stopping due to sob   Cough: dry cough/ mostly daytime Sleeping: flat bed/ 2 pillows  SABA use: variably over using combivent since ran out of breo / prednisone for shoulder helped the breathing only a little, may have helped the cough more  02:  2lpm at bedtime and as needed but not desated   rec Plan A = Automatic =  Breo one click each am x 2 good  smooth deep drags  Plan B = Backup Only use your albuterol /ipatropium (combivent) as a rescue medication Plan C = Crisis - only use your albuterol nebulizer if you first try Plan B      06/03/2018  f/u ov/[name] re: GOLD I  / still on BREO with  worse cough / still doe  Chief Complaint  Patient presents with  . Follow-up    Breathing has been worse "since I went off combivent" she is now using it about 2 x per day.   Dyspnea:  MMRC3 = can't walk 100 yards even at a slow pace at a flat grade s stopping due to sob   Cough: more x 10 days / more with walking / min mucoid production Sleeping: 30 degrees due to R shoulder  SABA use: twice daily  - was not  told she could take the combivent up to every 4 h if needed 02: 2lpm and prn daytime   rec Plan A = Automatic = Bevespi Take 2 puffs first thing in am and then another 2 puffs about 12 hours later.  Work on inhaler technique:  Plan B - Backup  -   ok to use the nebulizer up to every 4 hours if you must but if start needing it regularly call for immediate appointment    NP eval    06/10/18 Prednisone 10mg  tablet  >>>4 tabs for 2 days, then 3 tabs for 2 days, 2 tabs for 2 days, then 1 tab for 2 days, then stop  Doxycycline 100 mg >>> 1   tablet every 12 hours for 5 days Bevespi Aerosphere inhaler Only use your albuterol nebulizer as a rescue medication     06/25/2018 acute extended ov/[name] re: "no better"  Sob and  cough still present but mucus turned clear p doxy, did not help sob  Back on breo 100 x 11/25 with combivent up to 12 q12h - thoroughly confused with details of care Chief Complaint  Patient presents with  . Acute Visit    Breathing has been worse since last visit. She states she is SOB with exertion such as lobby to exam room today. She is coughing with white sputum. She states she has coughed until she "passed out and hit the floor". She is using her albuterol inhaler 2 x daily and albuterol neb about 6 x per day on average.   even nebulizer doesn't help much now with sob  X room to room>> sob  Much worse during coughing fits / better when uses codeine containing cough syrups Coughing so hard she feels she's going to pass out and does pee on herself as well  Still sleeping at 30 degrees due to sob/ cough  Back on breo x sev days and no better    No obvious day to day or daytime variability or assoc   purulent sputum or mucus plugs or hemoptysis or cp or chest tightness, subjective wheeze or overt sinus or hb symptoms.     Also denies any obvious fluctuation of symptoms with weather or environmental changes or other aggravating or alleviating factors except as outlined above   No unusual exposure hx or h/o childhood pna/ asthma or knowledge of premature birth.  Current Allergies, Complete Past Medical History, Past Surgical History, Family History, and Social History were reviewed in [facility] electronic medical record.  ROS  The following are not active complaints unless bolded Hoarseness, sore throat, dysphagia, dental problems, itching,  sneezing,  nasal congestion  or discharge of excess mucus or purulent secretions, ear ache,   fever, chills, sweats, unintended wt loss or wt gain, classically pleuritic or exertional cp,  orthopnea pnd or arm/hand swelling  or leg swelling, presyncope, palpitations, abdominal pain, anorexia, nausea, vomiting, diarrhea  or change in bowel habits or change in bladder habits, change in stools or change in urine, dysuria, hematuria,  rash, arthralgias, visual complaints, headache, numbness, weakness or ataxia or problems with walking or coordination,  change in mood or  memory.        Current Meds  Medication Sig  . albuterol (PROVENTIL) (2.5 MG/3ML) 0.083% nebulizer solution Take 2.5 mg by nebulization every 4 (four) hours as needed for wheezing or shortness of breath.  . Cariprazine HCl (VRAYLAR PO) Take 1 tablet by mouth daily.  . furosemide (LASIX) 20 MG tablet Take 1 tablet (20 mg total) by mouth daily.  Marland Kitchen guaiFENesin (MUCINEX) 600 MG 12 hr tablet Take 600 mg by mouth 2 (two) times daily.  Marland Kitchen guaiFENesin-codeine 100-10 MG/5ML syrup Take 5 mLs by mouth every 12 (twelve) hours as needed for cough.  . indomethacin (INDOCIN SR) 75 MG CR capsule Take 75 mg by mouth 2 (two) times daily.  . Ipratropium-Albuterol (COMBIVENT RESPIMAT) 20-100 MCG/ACT AERS respimat Inhale 2 puffs into the lungs 2 (two) times daily.  Marland Kitchen LORazepam (ATIVAN) 0.5 MG tablet Take 1 tablet by mouth daily as needed.  . methocarbamol (ROBAXIN) 500 MG tablet Take 500 mg by mouth 4 (four) times daily.  . mometasone (NASONEX) 50 MCG/ACT nasal spray Place 2 sprays into the nose daily.  . OXYGEN 2 lpm with sleep and "when I am at home and I need it"  . polyethylene glycol (MIRALAX / GLYCOLAX) packet Take 17 g by mouth daily.  Marland Kitchen PREMPRO 0.3-1.5 MG per tablet Take 1 tablet by mouth daily.  . Suvorexant (BELSOMRA) 15 MG TABS Take 1 tablet by mouth daily.  Marland Kitchen zolpidem (AMBIEN) 10 MG tablet Take 10 mg by mouth at bedtime as needed.  .  [DISCONTINUED] albuterol (PROVENTIL HFA;VENTOLIN HFA) 108 (90 Base) MCG/ACT inhaler Inhale 2 puffs into the lungs every 6 (six) hours as needed for wheezing or shortness of breath.  . [  fluticasone furoate-vilanterol (BREO ELLIPTA) 100-25 MCG/INH AEPB Inhale 1 puff into the lungs daily.  Marland Kitchen  ] guaiFENesin-codeine 100-10 MG/5ML syrup Take 5 mLs by mouth every 12 (twelve) hours as needed for cough.  Marland Kitchen   omeprazole (PRILOSEC) 20 MG capsule Take 1 capsule by mouth daily.           Objective:   Physical Exam    amb wf  nad until starts harsh coughing fits with classic pseudowheeze pattern/ very negative attitude today re her care    06/25/2018      193  06/03/2018        189  04/24/2018         185  04/25/2016         168   10/13/15 157 lb 6.4 oz (71.396 kg)  08/10/15 157 lb 6.4 oz (71.396 kg)  07/21/15 156 lb 6.4 oz (70.943 kg)    Vital signs reviewed - Note on arrival 02 sats  97% on RA    HEENT:   Edentulous, nl turbinates bilaterally, and oropharynx. Nl external ear canals without cough reflex   NECK :  without JVD/Nodes/TM/ nl carotid upstrokes bilaterally   LUNGS: no acc muscle use,  Nl contour chest  Prominent mostly upper airway "wheezing" bilaterally  without cough on insp or exp maneuvers   CV:  RRR  no s3 or murmur or increase in P2, and no edema   ABD:  soft and nontender with nl inspiratory excursion in the supine position. No bruits or organomegaly appreciated, bowel sounds nl  MS:  Nl gait/ ext warm without deformities, calf tenderness, cyanosis or clubbing Frozen R shoulder not manipulated during exam  SKIN: warm and dry without lesions    NEURO:  alert, approp, nl sensorium with  no motor or cerebellar deficits apparent.      CXR PA and Lateral:   06/25/2018 :    I personally reviewed images and agree with radiology impression as follows:   No active cardiopulmonary disease.    Labs ordered/ reviewed:      Chemistry      Component Value Date/Time    NA 140 06/25/2018 1146   K 3.9 06/25/2018 1146   CL 106 06/25/2018 1146   CO2 25 06/25/2018 1146   BUN 14 06/25/2018 1146   CREATININE 0.84 06/25/2018 1146      Component Value Date/Time   CALCIUM 8.9 06/25/2018 1146   ALKPHOS 53 07/31/2013 0653   AST 38 (H) 07/31/2013 0653   ALT 24 07/31/2013 0653   BILITOT 0.3 07/31/2013 0653        Lab Results  Component Value Date   WBC 7.9 06/25/2018   HGB 14.0 06/25/2018   HCT 40.9 06/25/2018   MCV 87.6 06/25/2018   PLT 260 06/25/2018         Lab Results  Component Value Date   TSH 1.60 06/25/2018       BNP   06/25/2018   =  8             Assessment:

## 2018-06-27 ENCOUNTER — Encounter: Payer: Self-pay | Admitting: Internal Medicine

## 2018-06-27 LAB — CBC WITH DIFFERENTIAL/PLATELET
BASOS ABS: 63 {cells}/uL (ref 0–200)
Basophils Relative: 0.8 %
EOS ABS: 142 {cells}/uL (ref 15–500)
Eosinophils Relative: 1.8 %
HEMATOCRIT: 40.9 % (ref 35.0–45.0)
HEMOGLOBIN: 14 g/dL (ref 11.7–15.5)
Lymphs Abs: 3681 cells/uL (ref 850–3900)
MCH: 30 pg (ref 27.0–33.0)
MCHC: 34.2 g/dL (ref 32.0–36.0)
MCV: 87.6 fL (ref 80.0–100.0)
MONOS PCT: 8.5 %
MPV: 11.1 fL (ref 7.5–12.5)
NEUTROS ABS: 3342 {cells}/uL (ref 1500–7800)
Neutrophils Relative %: 42.3 %
Platelets: 260 10*3/uL (ref 140–400)
RBC: 4.67 10*6/uL (ref 3.80–5.10)
RDW: 13.3 % (ref 11.0–15.0)
Total Lymphocyte: 46.6 %
WBC: 7.9 10*3/uL (ref 3.8–10.8)
WBCMIX: 672 {cells}/uL (ref 200–950)

## 2018-06-27 LAB — BRAIN NATRIURETIC PEPTIDE: BRAIN NATRIURETIC PEPTIDE: 8 pg/mL (ref <100)

## 2018-06-27 LAB — BASIC METABOLIC PANEL
BUN: 14 mg/dL (ref 7–25)
CO2: 25 mmol/L (ref 20–32)
Calcium: 8.9 mg/dL (ref 8.6–10.4)
Chloride: 106 mmol/L (ref 98–110)
Creat: 0.84 mg/dL (ref 0.50–0.99)
GLUCOSE: 130 mg/dL — AB (ref 65–99)
POTASSIUM: 3.9 mmol/L (ref 3.5–5.3)
SODIUM: 140 mmol/L (ref 135–146)

## 2018-06-27 LAB — TSH: TSH: 1.6 mIU/L (ref 0.40–4.50)

## 2018-06-27 NOTE — Assessment & Plan Note (Signed)
cpst  08/10/16: Conclusion: Exercise testing with gas exchange demonstrates a mild functional impairment when compared to matched sedentary norms. There is likely a mild circulatory limitation factor playing a role in exercise intolerance, given low PVO2 in combination with low OUES and low/blunted O2/pulse response (considering only mild chronotropic incompetence). Given pre-exercise spirometry, patient most likely primary limited due to pulmonary obstructive physiology. Patient's body habitus is contributing to her dyspnea and exercise intolerance. Patient is significantly deconditioned and may benefit from routine/monitored exercise program. - 04/24/2018   Walked RA x one lap @ 185 stopped due to  Sob at slow pace, no desats    No evidence of chf/ anemia/ thyroid dz > see copd     I had an extended discussion with the patient reviewing all relevant studies completed to date and  lasting 25 minutes of a 40  minute acute office  visit addressing  severe non-specific but potentially very serious refractory respiratory symptoms of uncertain and potentially multiple  etiologies.  See device teaching which extended face to face time for this visit   Each maintenance medication was reviewed in detail including most importantly the difference between maintenance and prns and under what circumstances the prns are to be triggered using an action plan format that is not reflected in the computer generated alphabetically organized AVS.    Please see AVS for specific instructions unique to this office visit that I personally wrote and verbalized to the the pt in detail and then reviewed with pt  by my nurse highlighting any changes in therapy/plan of care  recommended at today's visit.

## 2018-06-30 ENCOUNTER — Telehealth: Payer: Self-pay | Admitting: Internal Medicine

## 2018-06-30 ENCOUNTER — Telehealth: Payer: Self-pay

## 2018-06-30 NOTE — Progress Notes (Signed)
LMTCB

## 2018-06-30 NOTE — Progress Notes (Signed)
HPI The patient has been seen multiple times for dyspnea. In January 2015 she had a well preserved systolic function on echo. She apparently has had some coronary calcium.  She had a negative Lexiscan Myoview in 2016.  At a previous visit she had increased dyspnea and had been in the ED.  I sent her for a CPX.  This suggested that she was likely limited by pulmonary obstructive disease.  She was very deconditioned.  Holter had no significant arrhythmias.    She is to have shoulder replacement.   However, she has had increasing dyspnea with any activities.  I reviewed the pulmonary notes.  Check she does not qualify for home O2.  She describes breathlessness without her oxygen saturation falling.  She had the results of the testing above which were very limited in terms of her physical ability.  She is not having any new chest pressure, neck or arm discomfort.  She has resting rapid heart rates but she is not describing any new tachypalpitations.  She did get some short of breath once that she passed out she describes.  This was not recent.   Allergies  Allergen Reactions  . Azithromycin Nausea And Vomiting    Current Outpatient Medications  Medication Sig Dispense Refill  . albuterol (PROVENTIL) (2.5 MG/3ML) 0.083% nebulizer solution Take 2.5 mg by nebulization every 4 (four) hours as needed for wheezing or shortness of breath.    . Cariprazine HCl (VRAYLAR PO) Take 1 tablet by mouth daily.    . fluticasone furoate-vilanterol (BREO ELLIPTA) 100-25 MCG/INH AEPB Inhale 1 puff into the lungs daily. 1 each 0  . furosemide (LASIX) 20 MG tablet Take 1 tablet (20 mg total) by mouth daily. 90 tablet 3  . guaiFENesin (MUCINEX) 600 MG 12 hr tablet Take 600 mg by mouth 2 (two) times daily.    Marland Kitchen. guaiFENesin-codeine 100-10 MG/5ML syrup Take 5 mLs by mouth every 12 (twelve) hours as needed for cough. 236 mL 0  . indomethacin (INDOCIN SR) 75 MG CR capsule Take 75 mg by mouth 2 (two) times daily.    .  Ipratropium-Albuterol (COMBIVENT RESPIMAT) 20-100 MCG/ACT AERS respimat Inhale 2 puffs into the lungs 2 (two) times daily.    Marland Kitchen. LORazepam (ATIVAN) 0.5 MG tablet Take 1 tablet by mouth daily as needed.  0  . methocarbamol (ROBAXIN) 750 MG tablet methocarbamol 750 mg tablet  TAKE 1 TABLET BY MOUTH THREE TIMES A DAY    . mometasone (NASONEX) 50 MCG/ACT nasal spray Place 2 sprays into the nose daily. 17 g 11  . pantoprazole (PROTONIX) 40 MG tablet Take 1 tablet (40 mg total) by mouth daily. Take 30-60 min before first meal of the day 60 tablet 2  . polyethylene glycol (MIRALAX / GLYCOLAX) packet Take 17 g by mouth daily.    Marland Kitchen. PREMPRO 0.3-1.5 MG per tablet Take 1 tablet by mouth daily.    Marland Kitchen. zolpidem (AMBIEN) 10 MG tablet Take 10 mg by mouth at bedtime as needed.  1  . OXYGEN 2 lpm with sleep and "when I am at home and I need it"    . Suvorexant (BELSOMRA) 15 MG TABS Take 1 tablet by mouth daily.     No current facility-administered medications for this visit.     Past Medical History:  Diagnosis Date  . Arthritis   . Asthma    Breo and Combivent daily  . CHF (congestive heart failure) (HCC)    takes Lasix daily  .  COPD (chronic obstructive pulmonary disease) (HCC)   . Depression    takes Wellbutrin and Citalopram daily  . Emphysema lung (HCC)   . GERD (gastroesophageal reflux disease)    takes Omeprazole daily  . Glaucoma    mild"  . History of blood transfusion 1983   no abnormal reaction noted  . History of bronchitis 5-6wks ago  . Joint swelling   . Mood disorder (HCC)   . Multiple allergies    Fluticasone and Mucinex daily  . Peripheral neuropathy   . Pneumonia under the age of 64   hx of  . Vertigo    takes Meclizine daily as needed  . Weakness    numbness and tingling in right arm and leg    Past Surgical History:  Procedure Laterality Date  . ANTERIOR CERVICAL CORPECTOMY N/A 06/22/2015   Procedure: ANTERIOR CERVICAL DECOMPRESSION/DISCECTOMY FUSION C6 - C7 with  corpectomy;  Surgeon: Donalee Citrin, MD;  Location: [facility];  Service: Neurosurgery;  Laterality: N/A;  . BACK SURGERY     lumbar  . NECK SURGERY    . POSTERIOR CERVICAL FUSION/FORAMINOTOMY  07/08/2015   Procedure: POSTERIOR CERVICAL FUSION/FORAMINOTOMY CERVICAL TWO TO CERVICAL SEVEN;  Surgeon: Donalee Citrin, MD;  Location: [facility];  Service: Neurosurgery;;  . POSTERIOR FUSION CERVICAL SPINE  07/08/2015  . ROTATOR CUFF REPAIR     right  . TONSILLECTOMY AND ADENOIDECTOMY    . TUBAL LIGATION      ROS:   Positive for right shoulder pain.  Othherwise stated in the HPI and negative for all other systems.  PHYSICAL EXAM BP 130/90   Pulse (!) 105   Ht 5' 2.5" (1.588 m)   Wt 193 lb (87.5 kg)   BMI 34.74 kg/m  GENERAL:  Well appearing NECK:  No jugular venous distention, waveform within normal limits, carotid upstroke brisk and symmetric, no bruits, no thyromegaly LUNGS:  Clear to auscultation bilaterally CHEST:  Unremarkable HEART:  PMI not displaced or sustained,S1 and S2 within normal limits, no S3, no S4, no clicks, no rubs, no murmurs ABD:  Flat, positive bowel sounds normal in frequency in pitch, no bruits, no rebound, no guarding, no midline pulsatile mass, no hepatomegaly, no splenomegaly EXT:  2 plus pulses throughout, no edema, no cyanosis no clubbing   EKG: Sinus tachycardia, rate 105, axis within normal limits, intervals within normal limits, no acute ST-T wave changes.  ASSESSMENT AND PLAN   PREOP:   She has had increased shortness of breath.  She does have known coronary calcification.  Given the fact that this could be an anginal equivalent she needs stress testing but she would be able walk on a treadmill.  Therefore, she will have a  [employer].  DYSPNEA:    This is most likely multifactorial related to chronic lung disease and deconditioning.  Will be evaluated as above.  CHRONIC DIASTOLIC HF:   She seems to be euvolemic today.  No change in therapy.  I will  check a BNP level.   TACHYCARDIA:   She will need a 24-hour Holter at some point to evaluate her tachycardia.  2 years ago her resting heart rate was averaged 97 I will make sure she has normal nocturnal variation.

## 2018-06-30 NOTE — Telephone Encounter (Signed)
   [facility] Pre-operative Risk Assessment    Request for surgical clearance:  1. What type of surgery is being performed? Right Shoulder; Reversed Total Shoulder   2. When is this surgery scheduled? TBD   3. What type of clearance is required (medical clearance vs. Pharmacy clearance to hold med vs. Both)? Medical  4. Are there any medications that need to be held prior to surgery and how long? N/A   5. Practice name and name of physician performing surgery?  [place]   6. What is your office phone number [phone]    7.   What is your office fax number [phone]  8.   Anesthesia type (None, local, MAC, general) ? General   Ena Dawley 06/30/2018, 3:43 PM  _________________________________________________________________   (provider comments below)

## 2018-06-30 NOTE — Telephone Encounter (Signed)
Called and spoke with patient regarding lab results and chest xray. Patient was asking about surgical clearance , told he to contact her surgeons office and let us know what is still needed.   Nothing further needed at this time.

## 2018-07-01 NOTE — Telephone Encounter (Signed)
   Primary Cardiologist: Hochrein  Chart reviewed as part of pre-operative protocol coverage. Because of [name]'s past medical history and time since last visit, he/she will require a follow-up visit in order to better assess preoperative cardiovascular risk.  Pre-op covering staff: - Please schedule appointment and call patient to inform them. - Please contact requesting surgeon's office via preferred method (i.e, phone, fax) to inform them of need for appointment prior to surgery.  If applicable, this message will also be routed to pharmacy pool and/or primary cardiologist for input on holding anticoagulant/antiplatelet agent as requested below so that this information is available at time of patient's appointment.   Norma FredricksonLORI Yemariam Magar, NP  07/01/2018, 9:50 AM

## 2018-07-01 NOTE — Telephone Encounter (Signed)
Pt scheduled to see Dr. Antoine PocheHochrein 07/02/18

## 2018-07-01 NOTE — Telephone Encounter (Signed)
Called pt re: surgical clearance and the need for her to make an appt before clearance can be granted. Left a detailed message for pt to call back and make an appt.

## 2018-07-02 ENCOUNTER — Encounter: Payer: Self-pay | Admitting: Cardiology

## 2018-07-02 ENCOUNTER — Ambulatory Visit (INDEPENDENT_AMBULATORY_CARE_PROVIDER_SITE_OTHER): Payer: Medicaid Other | Admitting: Cardiology

## 2018-07-02 VITALS — BP 130/90 | HR 105 | Ht 62.5 in | Wt 193.0 lb

## 2018-07-02 DIAGNOSIS — I5032 Chronic diastolic (congestive) heart failure: Secondary | ICD-10-CM | POA: Insufficient documentation

## 2018-07-02 DIAGNOSIS — R0602 Shortness of breath: Secondary | ICD-10-CM

## 2018-07-02 DIAGNOSIS — R000 Tachycardia, unspecified: Secondary | ICD-10-CM | POA: Insufficient documentation

## 2018-07-02 DIAGNOSIS — Z01810 Encounter for preprocedural cardiovascular examination: Secondary | ICD-10-CM

## 2018-07-02 NOTE — Patient Instructions (Signed)
Medication Instructions:  Continue current medications  If you need a refill on your cardiac medications before your next appointment, please call your pharmacy.  Labwork: BNP HERE IN OUR OFFICE AT [facility]  Take the provided lab slips with you to the lab for your blood draw.   You will NOT need to fast   If you have labs (blood work) drawn today and your tests are completely normal, you will receive your results only by: Marland Kitchen. MyChart Message (if you have MyChart) OR . A paper copy in the mail If you have any lab test that is abnormal or we need to change your treatment, we will call you to review the results.  Testing/Procedures: Your physician has requested that you have a lexiscan myoview. For further information please visit [email or web address]. Please follow instruction sheet, as given.  Follow-Up: . You will need a follow up appointment in As Needed.    At [facility], you and your health needs are our priority.  As part of our continuing mission to provide you with exceptional heart care, we have created designated Provider Care Teams.  These Care Teams include your primary Cardiologist (physician) and Advanced Practice Providers (APPs -  Physician Assistants and Nurse Practitioners) who all work together to provide you with the care you need, when you need it.   Thank you for choosing [facility] at [facility]!!

## 2018-07-03 LAB — BRAIN NATRIURETIC PEPTIDE: BNP: 23 pg/mL (ref 0.0–100.0)

## 2018-07-08 ENCOUNTER — Telehealth (HOSPITAL_COMMUNITY): Payer: Self-pay

## 2018-07-08 ENCOUNTER — Encounter: Payer: Self-pay | Admitting: Internal Medicine

## 2018-07-08 ENCOUNTER — Ambulatory Visit (INDEPENDENT_AMBULATORY_CARE_PROVIDER_SITE_OTHER): Payer: Medicaid Other | Admitting: Internal Medicine

## 2018-07-08 VITALS — BP 126/76 | HR 100 | Ht 62.5 in | Wt 192.0 lb

## 2018-07-08 DIAGNOSIS — J449 Chronic obstructive pulmonary disease, unspecified: Secondary | ICD-10-CM | POA: Diagnosis not present

## 2018-07-08 DIAGNOSIS — R0609 Other forms of dyspnea: Secondary | ICD-10-CM

## 2018-07-08 MED ORDER — FLUTICASONE FUROATE-VILANTEROL 100-25 MCG/INH IN AEPB: 1.0000 | INHALATION_SPRAY | Freq: Every day | RESPIRATORY_TRACT | 0 refills | Status: DC

## 2018-07-08 MED ORDER — IPRATROPIUM-ALBUTEROL 20-100 MCG/ACT IN AERS: INHALATION_SPRAY | RESPIRATORY_TRACT | 11 refills | Status: DC

## 2018-07-08 MED ORDER — PANTOPRAZOLE SODIUM 40 MG PO TBEC: DELAYED_RELEASE_TABLET | ORAL | 2 refills | Status: DC

## 2018-07-08 NOTE — Patient Instructions (Addendum)
No change in medications   Continue protonix 40 mg Take 30- 60 min before your first and last meals of the day   Change the combivent up to one puff every 4 hours and follow with nebulizer if needed   You are cleared for surgery    Please schedule a follow up office visit in 6 weeks, call sooner if needed with all medications /inhalers/ solutions in hand so we can verify exactly what you are taking. This includes all medications from all doctors and over the counters - PLEASE separate them into two bags:  the ones you take automatically, no matter what, vs the ones you take just when you feel you need them "BAG #2 is UP TO YOU"  - this will really help us help you take your medications more effectively.

## 2018-07-08 NOTE — Progress Notes (Signed)
Subjective:    Patient ID: [name], female   DOB: 01-12-1956,     MRN: [id number]    Brief patient profile:  62 yowf quit smoking 1999 with cough that resolved and eval by Dr Delford Field in [place] with dx GOLD I copd  Vs AB assoc  seasonal allergy.   History of Present Illness  10/13/2015 1st  office visit/ [name]  rx  Combivent/ no saba  Chief Complaint  Patient presents with  . Follow-up    Former patient of Dr Delford Field. She c/o increased SOB and cough x 2 wks. She states that she is SOB with or without any exertion. She does not have a rescue inhaler.   not using 02 at all since 1st Feb 2017  Doe x [facility] = can't walk a nl pace on a flat grade s sob More cough at hs esp x 2 weeks  rec Plan A = Automatic = Symbicort 160 Take 2 puffs first thing in am and then another 2 puffs about 12 hours later.  Plan B = Backup Only use your combivent as a rescue medication    01/25/2016  f/u ov/[name] re: GOLD I copd / breo 200 and combivent x 2 pffs  Chief Complaint  Patient presents with  . Follow-up    Pt states her breathing is about the same. She has had minimal cough- prod with clear sputum.    doe = MMRC1 = can walk nl pace, flat grade, can't hurry or go uphills or steps s sob   Wants to know if allergic to cats, considering buying one  No noct cough/ no copd flares since last ov  rec Plan A = Automatic = Stiolto 2 puffs each am instead of Breo  Plan B = Backup Only use your combivent as a rescue medication  Please remember to go to the lab  department downstairs for your tests - we will call you with the results when they are available.     04/25/2016  f/u ov/[name] re: GOLD I  On breo 200 and prn combivent Chief Complaint  Patient presents with  . Follow-up    Breathing was worse with stiolto, so started back on breo. She had been doing better until a few days ago, started having increased SOB and sneezing- relates to allergies. She is using combivent 2 x per day on average.   confused  re approp use of prns and on breo 200 now  rec Plan A = Automatic = Breo  ONE CLICK each am x 2 drags  Plan B = Backup Only use your combivent s a rescue medication     04/24/2018  f/u ov/[name] re:  GOLD I  Chief Complaint  Patient presents with  . Acute Visit    Pt states her breathing has been worse since she ran stopped Breo 2 months ago. She states her insurance stopped covering med. She is using combivent 3 x daily on average.   Dyspnea:  MMRC3 = can't walk 100 yards even at a slow pace at a flat grade s stopping due to sob   Cough: dry cough/ mostly daytime Sleeping: flat bed/ 2 pillows  SABA use: variably over using combivent since ran out of breo / prednisone for shoulder helped the breathing only a little, may have helped the cough more  02:  2lpm at bedtime and as needed but not desated   rec Plan A = Automatic =  Breo one click each am x 2 good  smooth deep drags  Plan B = Backup Only use your albuterol /ipatropium (combivent) as a rescue medication Plan C = Crisis - only use your albuterol nebulizer if you first try Plan B      06/03/2018  f/u ov/[name] re: GOLD I  / still on BREO with  worse cough / still doe  Chief Complaint  Patient presents with  . Follow-up    Breathing has been worse "since I went off combivent" she is now using it about 2 x per day.   Dyspnea:  MMRC3 = can't walk 100 yards even at a slow pace at a flat grade s stopping due to sob   Cough: more x 10 days / more with walking / min mucoid production Sleeping: 30 degrees due to R shoulder  SABA use: twice daily  - was not  told she could take the combivent up to every 4 h if needed 02: 2lpm and prn daytime   rec Plan A = Automatic = Bevespi Take 2 puffs first thing in am and then another 2 puffs about 12 hours later.  Work on inhaler technique:  Plan B - Backup  -   ok to use the nebulizer up to every 4 hours if you must but if start needing it regularly call for immediate appointment    NP eval    06/10/18 Prednisone 10mg  tablet  >>>4 tabs for 2 days, then 3 tabs for 2 days, 2 tabs for 2 days, then 1 tab for 2 days, then stop  Doxycycline 100 mg >>> 1   tablet every 12 hours for 5 days Bevespi Aerosphere inhaler Only use your albuterol nebulizer as a rescue medication     06/25/2018 acute extended ov/[name] re: "no better"  Sob and  cough still present but mucus turned clear p doxy, did not help sob  Back on breo 100 x 11/25 with combivent up to 12 q12h - thoroughly confused with details of care Chief Complaint  Patient presents with  . Acute Visit    Breathing has been worse since last visit. She states she is SOB with exertion such as lobby to exam room today. She is coughing with white sputum. She states she has coughed until she "passed out and hit the floor". She is using her albuterol inhaler 2 x daily and albuterol neb about 6 x per day on average.   even nebulizer doesn't help much now with sob  X room to room>> sob  Much worse during coughing fits / better when uses codeine containing cough syrups Coughing so hard she feels she's going to pass out and does pee on herself as well  Still sleeping at 30 degrees due to sob/ cough  Back on breo x sev days and no better  rec Stop prilosec for now Start protonix 40 mg Take 30- 60 min before your first and last meals of the day  GERD  Plan A = Automatic = BREO 100 one click each am x 2 good drags then rinse and gargle Plan B = Backup Only use your combivent  inhaler as a rescue medication  Plan C = Crisis - only use your albuterol nebulizer if you first try Plan B   For cough > codeine cough syrup  1-2 tsp every 4 hours with goal = no cough at all  up office visit in 2 weeks, sooner if needed  with all medications /inhalers/ solutions in hand so we can verify exactly what you are  taking. This includes all medications from all doctors and over the counters - also bring your drug formulary for options    07/08/2018  f/u  ov/[name] re: copd 1 / "breo dep"  @ 100 x one click each am / combivent bid  Chief Complaint  Patient presents with  . Follow-up    Breathing has improved. She is using her combivent 2 x daily. She has not used her neb in the past wk.    Dyspnea:  MMRC3 = can't walk 100 yards even at a slow pace at a flat grade s stopping due to sob   Cough: gone / no cough med needed  for 3 days Sleeping: still up 30 degrees due to R shoulder pain not sob  SABA use: twice daily combivent  02: 2.5 lpm at home not qualified for amb 02    No obvious day to day or daytime variability or assoc excess/ purulent sputum or mucus plugs or hemoptysis or cp or chest tightness, subjective wheeze or overt sinus or hb symptoms.   Sleeping as above  without nocturnal  or early am exacerbation  of respiratory  c/o's or need for noct saba. Also denies any obvious fluctuation of symptoms with weather or environmental changes or other aggravating or alleviating factors except as outlined above   No unusual exposure hx or h/o childhood pna/ asthma or knowledge of premature birth.  Current Allergies, Complete Past Medical History, Past Surgical History, Family History, and Social History were reviewed in [employer] record.  ROS  The following are not active complaints unless bolded Hoarseness, sore throat, dysphagia, dental problems, itching, sneezing,  nasal congestion or discharge of excess mucus or purulent secretions, ear ache,   fever, chills, sweats, unintended wt loss or wt gain, classically pleuritic or exertional cp,  orthopnea pnd or arm/hand swelling  or leg swelling, presyncope, palpitations, abdominal pain, anorexia, nausea, vomiting, diarrhea  or change in bowel habits or change in bladder habits, change in stools or change in urine, dysuria, hematuria,  rash, arthralgias, visual complaints, headache, numbness, weakness or ataxia or problems with walking or coordination,  change in mood or   memory.        Current Meds  Medication Sig  . albuterol (PROVENTIL) (2.5 MG/3ML) 0.083% nebulizer solution Take 2.5 mg by nebulization every 4 (four) hours as needed for wheezing or shortness of breath.  . Cariprazine HCl (VRAYLAR PO) Take 1 tablet by mouth daily.  . cetirizine (ZYRTEC) 10 MG tablet Take 10 mg by mouth daily.  . DULoxetine (CYMBALTA) 60 MG capsule Take 60 mg by mouth 2 (two) times daily.  . fluticasone furoate-vilanterol (BREO ELLIPTA) 100-25 MCG/INH AEPB Inhale 1 puff into the lungs daily.  . furosemide (LASIX) 20 MG tablet Take 1 tablet (20 mg total) by mouth daily.  Marland Kitchen guaifenesin (HUMIBID E) 400 MG TABS tablet 3 tablets daily  . guaiFENesin-codeine 100-10 MG/5ML syrup Take 5 mLs by mouth every 12 (twelve) hours as needed for cough.  . indomethacin (INDOCIN SR) 75 MG CR capsule Take 75 mg by mouth 2 (two) times daily.  . Ipratropium-Albuterol (COMBIVENT RESPIMAT) 20-100 MCG/ACT AERS respimat Up to one puff every 4 hours if needed  . LORazepam (ATIVAN) 0.5 MG tablet Take 1 tablet by mouth daily as needed.  . methocarbamol (ROBAXIN) 750 MG tablet methocarbamol 750 mg tablet  TAKE 1 TABLET BY MOUTH THREE TIMES A DAY  . OXYGEN 2 lpm with sleep and "when I am at  home and I need it"  . pantoprazole (PROTONIX) 40 MG tablet Take 30- 60 min before your first and last meals of the day  . polyethylene glycol (MIRALAX / GLYCOLAX) packet Take 17 g by mouth daily as needed.   Marland Kitchen PREMPRO 0.3-1.5 MG per tablet Take 1 tablet by mouth daily.  Marland Kitchen zolpidem (AMBIEN) 10 MG tablet Take 10 mg by mouth at bedtime as needed.  .     .     .                    Objective:   Physical Exam    amb wf nad    07/08/2018      194  06/25/2018      193  06/03/2018        189  04/24/2018         185  04/25/2016         168   10/13/15 157 lb 6.4 oz (71.396 kg)  08/10/15 157 lb 6.4 oz (71.396 kg)  07/21/15 156 lb 6.4 oz (70.943 kg)     Vital signs reviewed - Note on arrival 02 sats  94% on  RA       .HEENT: Edentulous, nl  turbinates bilaterally, and oropharynx. Nl external ear canals without cough reflex   NECK :  without JVD/Nodes/TM/ nl carotid upstrokes bilaterally   LUNGS: no acc muscle use,  Nl contour chest which is clear to A and P bilaterally without cough on insp or exp maneuvers   CV:  RRR  no s3 or murmur or increase in P2, and no edema   ABD:  soft and nontender with nl inspiratory excursion in the supine position. No bruits or organomegaly appreciated, bowel sounds nl  MS:  Nl gait/ ext warm without deformities, calf tenderness, cyanosis or clubbing R shoulder not manipulated as reported frozen   SKIN: warm and dry without lesions    NEURO:  alert, approp, nl sensorium with  no motor or cerebellar deficits apparent.        I personally reviewed images and agree with radiology impression as follows:  CXR:   06/25/18 No active cardiopulmonary disease.            Assessment:

## 2018-07-08 NOTE — Assessment & Plan Note (Signed)
cpst  08/10/16: Conclusion: Exercise testing with gas exchange demonstrates a mild functional impairment when compared to matched sedentary norms. There is likely a mild circulatory limitation factor playing a role in exercise intolerance, given low PVO2 in combination with low OUES and low/blunted O2/pulse response (considering only mild chronotropic incompetence). Given pre-exercise spirometry, patient most likely primary limited due to pulmonary obstructive physiology. Patient's body habitus is contributing to [name] dyspnea and exercise intolerance. Patient is significantly deconditioned and may benefit from routine/monitored exercise program. - 04/24/2018   Walked RA x one lap @ 185 stopped due to  Sob at slow pace, no desats   I reviewed the above findings with [name] to assure [name] that oxygen is not [name] limiting factor though she certainly may need it postop and also may benefit from perioperative BiPAP related to obesity and narcotic need perioperatively. We can see [name] post op prn

## 2018-07-08 NOTE — Telephone Encounter (Signed)
Encounter complete. 

## 2018-07-08 NOTE — Assessment & Plan Note (Addendum)
Quit smoking 1999 -   PFT's  08/26/13   FEV1 1.75 (70 % ) ratio 59  p 10 % improvement from saba   with DLCO  57 % corrects to 54 % for alv volume   - alpha One AT screen  10/16/17  :  MM/ level 128  10/13/2015  try symbicort 160 2bid - PFT's  01/25/2016  FEV1 2.13  (87 % ) ratio 63  p 7 % improvement from saba p breo/combivent prior to study with DLCO  72/70 % corrects to 65 % for alv volume   - 01/25/2016    changed to stiolto from breo > worse so back to breo but changed to 200 from 100  - Rehab 02/07/16 - stopped 9/6 /17 inconsistent attendance / did not want to continue due to neck pain  - 04/25/2016   > changed again  to breo 100 from 200 and use combivent prn   - Spirometry 04/24/2018   Unable to perform - FENO 04/24/2018  =   Unable to perform - 06/03/2018   try bevespi > d/c'd by pt 06/23/18 due not not working and changed herself back to Breo 100 though not covered by present insurance  Spirometry 07/08/2018  FEV1 1.5 (61%)  Ratio 60 mild curvature p breo 100 in am  07/08/2018  After extensive coaching inhaler device,  effectiveness =    90% with smi > continue prn combivent.   She is well compensated on present laba/ics but the challenge is she says the only thing that works for her is Breo 100 which is not on her formulary.  I gave her samples of this and cleared her for surgery and reminded her that she has a good back-up plan in Combivent to be used as needed to supplement the [facility] and then the nebulizer albuterol to supplement the Combivent that should get her through surgery and then we can certainly see her postop.  She says that Trelegy does not work where as Virgel BouquetBreo does which is difficult to understand because trilogy has Virgel BouquetBreo and it plus a [place] which actually may give her additional benefit in terms of a bronchodilator.  She says she has a bagful of medicines that do not work at home and she will be happy to show it to us but suspect some of her symptoms are related to anxiety and  deconditioning and not directly related to COPD which is relatively mild  >>> pulmoanry f/u is 6 weeks with all meds in hand using a trust but verify approach to confirm accurate Medication  Reconciliation The principal here is that until we are certain that the  patients are doing what we've asked, it makes no sense to ask them to do more.    I had an extended discussion with the patient reviewing all relevant studies completed to date and  lasting 15 to 20 minutes of a 25 minute visit    See device teaching which extended face to face time for this visit.  Each maintenance medication was reviewed in detail including emphasizing most importantly the difference between maintenance and prns and under what circumstances the prns are to be triggered using an action plan format that is not reflected in the computer generated alphabetically organized AVS which I have not found useful in most complex patients, especially with respiratory illnesses  Please see AVS for specific instructions unique to this visit that I personally wrote and verbalized to the the pt in detail and then reviewed  with pt  by my nurse highlighting any  changes in therapy recommended at today's visit to their plan of care.

## 2018-07-09 ENCOUNTER — Telehealth (HOSPITAL_COMMUNITY): Payer: Self-pay

## 2018-07-09 ENCOUNTER — Ambulatory Visit: Payer: Medicaid Other | Admitting: Internal Medicine

## 2018-07-09 NOTE — Telephone Encounter (Signed)
Encounter complete. 

## 2018-07-10 ENCOUNTER — Ambulatory Visit (HOSPITAL_COMMUNITY)
Admission: RE | Admit: 2018-07-10 | Discharge: 2018-07-10 | Disposition: A | Discharge status: Home / Self Care | Payer: Medicaid Other | Source: Ambulatory Visit | Attending: Cardiology | Admitting: Cardiology

## 2018-07-10 DIAGNOSIS — R0602 Shortness of breath: Secondary | ICD-10-CM | POA: Insufficient documentation

## 2018-07-10 LAB — MYOCARDIAL PERFUSION IMAGING
CHL CUP RESTING HR STRESS: 88 {beats}/min
CSEPPHR: 105 {beats}/min
LVDIAVOL: 64 mL (ref 46–106)
LVSYSVOL: 38 mL
NUC STRESS TID: 0.8
SDS: 1
SRS: 2
SSS: 3

## 2018-07-10 MED ORDER — TECHNETIUM TC 99M TETROFOSMIN IV KIT
10.4000 | PACK | Freq: Once | INTRAVENOUS | Status: AC | PRN
  Administered 2018-07-10: 10.4 via INTRAVENOUS
  Filled 2018-07-10: qty 11

## 2018-07-10 MED ORDER — REGADENOSON 0.4 MG/5ML IV SOLN
0.4000 mg | Freq: Once | INTRAVENOUS | Status: AC
  Administered 2018-07-10: 0.4 mg via INTRAVENOUS

## 2018-07-10 MED ORDER — TECHNETIUM TC 99M TETROFOSMIN IV KIT
30.7000 | PACK | Freq: Once | INTRAVENOUS | Status: AC | PRN
  Administered 2018-07-10: 30.7 via INTRAVENOUS
  Filled 2018-07-10: qty 31

## 2018-07-11 ENCOUNTER — Ambulatory Visit: Payer: Medicaid Other | Admitting: Internal Medicine

## 2018-07-11 ENCOUNTER — Telehealth: Payer: Self-pay | Admitting: *Deleted

## 2018-07-11 DIAGNOSIS — I519 Heart disease, unspecified: Secondary | ICD-10-CM

## 2018-07-11 NOTE — Telephone Encounter (Signed)
-----   Message from Rollene RotundaJames Hochrein, MD sent at 07/11/2018  8:46 AM EST ----- No ischemia but EF appears to be low.  Please check an echo.  Preop clearance will be pending this.  Call Ms. Sundstrom with the results and send results to Blane Oharaox, Kirsten, MD

## 2018-07-11 NOTE — Telephone Encounter (Signed)
Pt aware of her stress test, Echo ordered and send to scheduler to pt schedule.

## 2018-07-14 ENCOUNTER — Ambulatory Visit (HOSPITAL_COMMUNITY): Payer: Medicaid Other | Attending: Cardiology

## 2018-07-14 ENCOUNTER — Other Ambulatory Visit: Payer: Self-pay

## 2018-07-14 DIAGNOSIS — I519 Heart disease, unspecified: Secondary | ICD-10-CM | POA: Diagnosis present

## 2018-08-01 ENCOUNTER — Telehealth: Payer: Self-pay

## 2018-08-01 NOTE — Telephone Encounter (Signed)
Primary Cardiologist: [facility] Pre-operative Risk Assessment    Request for surgical clearance:  1. What type of surgery is being performed? Right reversed total shoulder   2. When is this surgery scheduled?  TBD   3. What type of clearance is required (medical clearance vs. Pharmacy clearance to hold med vs. Both)? Both  4. Are there any medications that need to be held prior to surgery and how long? Not listed  5. Practice name and name of physician performing surgery?  [facility]   6. What is your office phone number [phone]    7.   What is your office fax number [phone]  8.   Anesthesia type (None, local, MAC, general) ? General   Lamar Laundry 08/01/2018, 4:49 PM  _________________________________________________________________   (provider comments below)

## 2018-08-07 NOTE — Telephone Encounter (Signed)
   Primary Cardiologist: Rollene Rotunda, MD  Chart reviewed as part of pre-operative protocol coverage. Given past medical history and time since last visit, based on ACC/AHA guidelines, Virgia Tack Beachem would be at acceptable risk for the planned procedure.  She had nuclear stress test on 07/10/2018 that showed no ischemia but low EF. Follow up echo showed normal EF 55-60%.   I will route this recommendation to the requesting party via Epic fax function and remove from pre-op pool.  Please call with questions.  Berton Bon, NP 08/07/2018, 2:30 PM

## 2018-08-11 NOTE — Progress Notes (Signed)
CARDIAC CLEARANCE -SEE TELE NOTE 08-07-2018 Epic /ON CHART   PULMONARY CLEARANCE 07-08-18 ON CHART   EKG 07-02-18 Epic   STRESS TEST 07-10-18 Epic   ECHO 07-14-18 Epic   CXR 06-25-18 Epic

## 2018-08-11 NOTE — Patient Instructions (Addendum)
Jo Holford Mcleroy  08/11/2018   Your procedure is scheduled on: 08-14-2018   Report to [facility] Main  Entrance     Report to admitting at 1:20PM      Call this number if you have problems the morning of surgery [phone]      Remember: Do not eat food :After Midnight. YOU MAY HAVE CLEAR LIQUIDS FROM MIDNIGHT UNTIL 9:45AM. NOTHING BY MOUTH AFTER 9:45AM!  BRUSH YOUR TEETH MORNING OF SURGERY AND RINSE YOUR MOUTH OUT, NO CHEWING GUM CANDY OR MINTS.     CLEAR LIQUID DIET   Foods Allowed                                                                     Foods Excluded  Coffee and tea, regular and decaf                             liquids that you cannot  Plain Jell-O in any flavor                                             see through such as: Fruit ices (not with fruit pulp)                                     milk, soups, orange juice  Iced Popsicles                                    All solid food Carbonated beverages, regular and diet                                    Cranberry, grape and apple juices Sports drinks like Gatorade Lightly seasoned clear broth or consume(fat free) Sugar, honey syrup  Sample Menu Breakfast                                Lunch                                     Supper Cranberry juice                    Beef broth                            Chicken broth Jell-O                                     Grape juice  Apple juice Coffee or tea                        Jell-O                                      Popsicle                                                Coffee or tea                        Coffee or tea  _____________________________________________________________________       Take these medicines the morning of surgery with A SIP OF WATER: DULOXETINE, GUAIFENESIN, CAPRIPRAZINE, COMBIVENT INHALER, BREO INHALER, ALBUTEROL INHALER IF NEEDED, lorazepam                                 You  may not have any metal on your body including hair pins and              piercings  Do not wear jewelry, make-up, lotions, powders or perfumes, deodorant             Do not wear nail polish.  Do not shave  48 hours prior to surgery.                Do not bring valuables to the hospital. [facility] IS NOT             RESPONSIBLE   FOR VALUABLES.  Contacts, dentures or bridgework may not be worn into surgery.  Leave suitcase in the car. After surgery it may be brought to your room.                   Please read over the following fact sheets you were given: _____________________________________________________________________             [facility] - Preparing for Surgery Before surgery, you can play an important role.  Because skin is not sterile, your skin needs to be as free of germs as possible.  You can reduce the number of germs on your skin by washing with CHG (chlorahexidine gluconate) soap before surgery.  CHG is an antiseptic cleaner which kills germs and bonds with the skin to continue killing germs even after washing. Please DO NOT use if you have an allergy to CHG or antibacterial soaps.  If your skin becomes reddened/irritated stop using the CHG and inform your nurse when you arrive at Short Stay. Do not shave (including legs and underarms) for at least 48 hours prior to the first CHG shower.  You may shave your face/neck. Please follow these instructions carefully:  1.  Shower with CHG Soap the night before surgery and the  morning of Surgery.  2.  If you choose to wash your hair, wash your hair first as usual with your  normal  shampoo.  3.  After you shampoo, rinse your hair and body thoroughly to remove the  shampoo.  4.  Use CHG as you would any other liquid soap.  You can apply chg directly  to the skin and wash                       Gently with a scrungie or clean washcloth.  5.  Apply the CHG Soap to your body ONLY FROM THE NECK DOWN.   Do  not use on face/ open                           Wound or open sores. Avoid contact with eyes, ears mouth and genitals (private parts).                       Wash face,  Genitals (private parts) with your normal soap.             6.  Wash thoroughly, paying special attention to the area where your surgery  will be performed.  7.  Thoroughly rinse your body with warm water from the neck down.  8.  DO NOT shower/wash with your normal soap after using and rinsing off  the CHG Soap.                9.  Pat yourself dry with a clean towel.            10.  Wear clean pajamas.            11.  Place clean sheets on your bed the night of your first shower and do not  sleep with pets. Day of Surgery : Do not apply any lotions/deodorants the morning of surgery.  Please wear clean clothes to the hospital/surgery center.  FAILURE TO FOLLOW THESE INSTRUCTIONS MAY RESULT IN THE CANCELLATION OF YOUR SURGERY PATIENT SIGNATURE_________________________________  NURSE SIGNATURE__________________________________  ________________________________________________________________________   Adam Phenix  An incentive spirometer is a tool that can help keep your lungs clear and active. This tool measures how well you are filling your lungs with each breath. Taking long deep breaths may help reverse or decrease the chance of developing breathing (pulmonary) problems (especially infection) following:  A long period of time when you are unable to move or be active. BEFORE THE PROCEDURE   If the spirometer includes an indicator to show your best effort, your nurse or respiratory therapist will set it to a desired goal.  If possible, sit up straight or lean slightly forward. Try not to slouch.  Hold the incentive spirometer in an upright position. INSTRUCTIONS FOR USE  1. Sit on the edge of your bed if possible, or sit up as far as you can in bed or on a chair. 2. Hold the incentive spirometer in an upright  position. 3. Breathe out normally. 4. Place the mouthpiece in your mouth and seal your lips tightly around it. 5. Breathe in slowly and as deeply as possible, raising the piston or the ball toward the top of the column. 6. Hold your breath for 3-5 seconds or for as long as possible. Allow the piston or ball to fall to the bottom of the column. 7. Remove the mouthpiece from your mouth and breathe out normally. 8. Rest for a few seconds and repeat Steps 1 through 7 at least 10 times every 1-2 hours when you are awake. Take your time and take a few normal breaths between deep breaths. 9. The spirometer may include an indicator to  show your best effort. Use the indicator as a goal to work toward during each repetition. 10. After each set of 10 deep breaths, practice coughing to be sure your lungs are clear. If you have an incision (the cut made at the time of surgery), support your incision when coughing by placing a pillow or rolled up towels firmly against it. Once you are able to get out of bed, walk around indoors and cough well. You may stop using the incentive spirometer when instructed by your caregiver.  RISKS AND COMPLICATIONS  Take your time so you do not get dizzy or light-headed.  If you are in pain, you may need to take or ask for pain medication before doing incentive spirometry. It is harder to take a deep breath if you are having pain. AFTER USE  Rest and breathe slowly and easily.  It can be helpful to keep track of a log of your progress. Your caregiver can provide you with a simple table to help with this. If you are using the spirometer at home, follow these instructions: [place] IF:   You are having difficultly using the spirometer.  You have trouble using the spirometer as often as instructed.  Your pain medication is not giving enough relief while using the spirometer.  You develop fever of 100.5 F (38.1 C) or higher. SEEK IMMEDIATE MEDICAL CARE IF:    You cough up bloody sputum that had not been present before.  You develop fever of 102 F (38.9 C) or greater.  You develop worsening pain at or near the incision site. MAKE SURE YOU:   Understand these instructions.  Will watch your condition.  Will get help right away if you are not doing well or get worse. Document Released: 11/26/2006 Document Revised: 10/08/2011 Document Reviewed: 01/27/2007 [facility] Patient Information 2014 [place], [place].   ________________________________________________________________________

## 2018-08-12 ENCOUNTER — Other Ambulatory Visit: Payer: Self-pay

## 2018-08-12 ENCOUNTER — Encounter (HOSPITAL_COMMUNITY)
Admission: RE | Admit: 2018-08-12 | Discharge: 2018-08-12 | Disposition: A | Discharge status: Home / Self Care | Payer: Medicaid Other | Source: Ambulatory Visit | Attending: Orthopedic Surgery | Admitting: Orthopedic Surgery

## 2018-08-12 ENCOUNTER — Encounter (HOSPITAL_COMMUNITY): Payer: Self-pay

## 2018-08-12 DIAGNOSIS — Z01812 Encounter for preprocedural laboratory examination: Secondary | ICD-10-CM | POA: Diagnosis not present

## 2018-08-12 HISTORY — DX: Other complications of anesthesia, initial encounter: T88.59XA

## 2018-08-12 HISTORY — DX: Dyspnea, unspecified: R06.00

## 2018-08-12 HISTORY — DX: Adverse effect of unspecified anesthetic, initial encounter: T41.45XA

## 2018-08-12 HISTORY — DX: Other abnormalities of gait and mobility: R26.89

## 2018-08-12 HISTORY — DX: Dependence on supplemental oxygen: Z99.81

## 2018-08-12 LAB — BASIC METABOLIC PANEL
Anion gap: 8 (ref 5–15)
BUN: 14 mg/dL (ref 6–20)
CO2: 27 mmol/L (ref 22–32)
Calcium: 9.2 mg/dL (ref 8.9–10.3)
Chloride: 108 mmol/L (ref 98–111)
Creatinine, Ser: 0.84 mg/dL (ref 0.44–1.00)
GFR calc Af Amer: >60 mL/min (ref >60)
GFR calc non Af Amer: >60 mL/min (ref >60)
Glucose, Bld: 76 mg/dL (ref 70–99)
Potassium: 3.9 mmol/L (ref 3.5–5.1)
Sodium: 143 mmol/L (ref 135–145)

## 2018-08-12 LAB — CBC
HCT: 43.2 % (ref 36.0–46.0)
HEMOGLOBIN: 13.8 g/dL (ref 12.0–15.0)
MCH: 30.1 pg (ref 26.0–34.0)
MCHC: 31.9 g/dL (ref 30.0–36.0)
MCV: 94.1 fL (ref 80.0–100.0)
Platelets: 257 10*3/uL (ref 150–400)
RBC: 4.59 MIL/uL (ref 3.87–5.11)
RDW: 14.2 % (ref 11.5–15.5)
WBC: 8 10*3/uL (ref 4.0–10.5)
nRBC: 0 % (ref 0.0–0.2)

## 2018-08-12 LAB — SURGICAL PCR SCREEN
MRSA, PCR: NEGATIVE
Staphylococcus aureus: POSITIVE — AB

## 2018-08-12 NOTE — Progress Notes (Signed)
PLAN TO OBTAIN EKG AT PRE-OP. PATIENT REPORTS JUST HAD IN [place] AND IT WAS SENT TO DR. SUPPLE OFFICE. FAX REQUEST SENT TO DR. SUPPLE OFFICE

## 2018-08-13 NOTE — Progress Notes (Signed)
Anesthesia Chart Review   Case:  [id number] Date/Time:  08/14/18 1535   Procedure:  REVERSE SHOULDER ARTHROPLASTY (Right ) -   Anesthesia type:  General   Pre-op diagnosis:  advanced osteoarthritis right shoulder   Location:  [facility] ROOM 07 / [facility]   Surgeon:  Francena Hanly, MD      DISCUSSION: 63 yo former smoker (15 pack years, quit 07/30/97) with h/o CHF, emphysema, GERD, peripheral neuropathy, COPD, asthma, vertigo, depression, on home O2 (2.5L prn and at bedtime) scheduled for above surgery on 08/14/18 with Dr. Francena Hanly.   Pt reports that after anesthesia she is slow to wake and reports O2 drops below 90 for several days.    Pt last seen by pulmonology on 07/08/18, seen by Dr. Sandrea Hughs.  Cleared for surgery per his note at this visit. He states, "that oxygen is not her limiting factor though she certainly may need it postop and also may benefit from perioperative BiPAP related to obesity and narcotic need perioperatively. We can see her post op prn." At PST visit on 08/12/18 pt reports she is feeling the best she has in a long time.  She was able to walk up a flight of stairs today with O2 use.  She reports she has needed her O2 less over the last few days and has only used it at night.    During PST visit pt also reports limited ROM of cervical spine due to previous posterior cervical fusion 07/08/15.  She has limited ROM with rotation and extension, but reports she can lie flat in bed without difficulty.   She was last seen by Dr. Rollene Rotunda with cardiology on 07/02/18.  At this visit she was experiencing increased shortness of breath, stress test ordered due to known coronary calcification.  Cardiac clearance received from Berton Bon, NP 08/07/18.  Per her note, "Given past medical history and time since last visit, based on ACC/AHA guidelines, [name] would be at acceptable risk for the planned procedure.  She had nuclear stress test on 07/10/2018 that showed no ischemia but  low EF. Follow up echo showed normal EF 55-60%."   Pt can proceed with planned procedure barring acute status change.  VS: BP (!) 143/89   Pulse (!) 402   Temp 36.8 C (Oral)   Resp 18   SpO2 94%   PROVIDERS: CoxFritzi Mandes, MD is PCP  Rollene Rotunda, MD is Cardiologist   Nyoka Cowden, MD is Pulmonologist  LABS: Labs reviewed: Acceptable for surgery. (all labs ordered are listed, but only abnormal results are displayed)  Labs Reviewed  SURGICAL PCR SCREEN - Abnormal; Notable for the following components:      Result Value   Staphylococcus aureus POSITIVE (*)    All other components within normal limits  CBC  BASIC METABOLIC PANEL     IMAGES: Chest Xray 06/25/18 FINDINGS: The heart size and mediastinal contours are within normal limits. Both lungs are clear. No pneumothorax or pleural effusion is noted. The visualized skeletal structures are unremarkable.  IMPRESSION: No active cardiopulmonary disease.   EKG: 07/02/18 Rate 105bpm Sinus tachycardia Otherwise normal ECG  CV: Stress Test 07/10/18  The left ventricular ejection fraction is moderately decreased (30-44%).  Nuclear stress EF: 40%.  There was no ST segment deviation noted during stress.  The study is normal.  This is an intermediate risk study due to LV dysfunction.  Consider echo to assess LVF further  Echo 07/17/18 Study Conclusions  -  Left ventricle: The cavity size was normal. There was mild focal   basal hypertrophy of the septum. Systolic function was normal.   The estimated ejection fraction was in the range of 55% to 60%.   Wall motion was normal; there were no regional wall motion   abnormalities. Doppler parameters are consistent with abnormal   left ventricular relaxation (grade 1 diastolic dysfunction).   There was no evidence of elevated ventricular filling pressure by   Doppler parameters. - Aortic valve: There was no regurgitation. - Right ventricle: The cavity size  was normal. Wall thickness was   normal. Systolic function was normal. - Right atrium: The atrium was normal in size. - Tricuspid valve: There was no regurgitation. - Pulmonary arteries: Systolic pressure was within the normal   range. - Inferior vena cava: The vessel was normal in size. The   respirophasic diameter changes were in the normal range (= 50%),   consistent with normal central venous pressure. - Pericardium, extracardiac: There was no pericardial effusion.  Impressions:  - There is no significant difference since the prior study on   07/31/2013. Past Medical History:  Diagnosis Date  . Arthritis   . Asthma    Breo and Combivent daily  . CHF (congestive heart failure) (HCC)    takes Lasix daily  . Complication of anesthesia    slow to wake ; " everytime i have surgery my oxgen drops down below the 90s and it stays like that for sometime days after surgery "   . COPD (chronic obstructive pulmonary disease) (HCC)   . Decreased spinal mobility    cervical    . Depression    takes Wellbutrin and Citalopram daily  . Dyspnea    ongoing   . Emphysema lung (HCC)   . GERD (gastroesophageal reflux disease)    takes Omeprazole daily  . Glaucoma    mild"---unsure if correct , has heard that she has it and hear that she does not have it   . History of blood transfusion 1983   no abnormal reaction noted  . History of bronchitis 5-6wks ago  . Joint swelling   . Mood disorder (HCC)   . Multiple allergies    Fluticasone and Mucinex daily  . On home oxygen therapy    2.5L prn and at bedtime  . Peripheral neuropathy   . Pneumonia under the age of 75   hx of  . Vertigo    takes Meclizine daily as needed  . Weakness    numbness and tingling in right arm and leg    Past Surgical History:  Procedure Laterality Date  . ANTERIOR CERVICAL CORPECTOMY N/A 06/22/2015   Procedure: ANTERIOR CERVICAL DECOMPRESSION/DISCECTOMY FUSION C6 - C7 with corpectomy;  Surgeon: Donalee Citrin,  MD;  Location: [facility];  Service: Neurosurgery;  Laterality: N/A;  . BACK SURGERY     lumbar; i had 3 lower back surgeries   . NECK SURGERY    . POSTERIOR CERVICAL FUSION/FORAMINOTOMY  07/08/2015   Procedure: POSTERIOR CERVICAL FUSION/FORAMINOTOMY CERVICAL TWO TO CERVICAL SEVEN;  Surgeon: Donalee Citrin, MD;  Location: [facility];  Service: Neurosurgery;;  . POSTERIOR FUSION CERVICAL SPINE  07/08/2015  . ROTATOR CUFF REPAIR     right  . TONSILLECTOMY AND ADENOIDECTOMY    . TUBAL LIGATION      MEDICATIONS: . acetaminophen (TYLENOL) 500 MG tablet  . albuterol (PROVENTIL) (2.5 MG/3ML) 0.083% nebulizer solution  . Cariprazine HCl (VRAYLAR PO)  . DULoxetine (CYMBALTA)  60 MG capsule  . fluticasone furoate-vilanterol (BREO ELLIPTA) 100-25 MCG/INH AEPB  . furosemide (LASIX) 20 MG tablet  . guaifenesin (HUMIBID E) 400 MG TABS tablet  . guaiFENesin-codeine 100-10 MG/5ML syrup  . indomethacin (INDOCIN SR) 75 MG CR capsule  . Ipratropium-Albuterol (COMBIVENT RESPIMAT) 20-100 MCG/ACT AERS respimat  . LORazepam (ATIVAN) 0.5 MG tablet  . methocarbamol (ROBAXIN) 750 MG tablet  . montelukast (SINGULAIR) 10 MG tablet  . OXYGEN  . pantoprazole (PROTONIX) 40 MG tablet  . polyethylene glycol (MIRALAX / GLYCOLAX) packet  . PREMPRO 0.3-1.5 MG per tablet  . Suvorexant (BELSOMRA) 15 MG TABS   No current facility-administered medications for this encounter.     Taliesin Hartlage, PA-C [facility] [phone] 08/13/18 9:56 AM

## 2018-08-13 NOTE — Anesthesia Preprocedure Evaluation (Addendum)
Anesthesia Evaluation  Patient identified by MRN, date of birth, ID band Patient awake    Reviewed: Allergy & Precautions, NPO status , Patient's Chart, lab work & pertinent test results  Airway Mallampati: III  TM Distance: >3 FB Neck ROM: Full    Dental  (+) Edentulous Upper, Edentulous Lower   Pulmonary asthma , COPD,  COPD inhaler and oxygen dependent, former smoker,  Pt last seen by pulmonology on 07/08/18, seen by Dr. Sandrea Hughs.  Cleared for surgery per his note at this visit. He states, "that oxygen is not her limiting factor though she certainly may need it postop and also may benefit from perioperative BiPAP related to obesity and narcotic need perioperatively. We can see her post op prn." At PST visit on 08/12/18 pt reports she is feeling the best she has in a long time.  She was able to walk up a flight of stairs today with O2 use.  She reports she has needed her O2 less over the last few days and has only used it at night.    Pulmonary exam normal breath sounds clear to auscultation       Cardiovascular +CHF  Normal cardiovascular exam Rhythm:Regular Rate:Normal  ECHO: LV EF: 55% - 60%  Myocardial  The left ventricular ejection fraction is moderately decreased (30-44%). Nuclear stress EF: 40%. There was no ST segment deviation noted during stress. The study is normal. This is an intermediate risk study due to LV dysfunction. Consider echo to assess LVF further  ECG: ST, rate 105  Sees cardiologist (Hochrein)    Neuro/Psych PSYCHIATRIC DISORDERS Anxiety Depression Peripheral neuropathy  Neuromuscular disease    GI/Hepatic Neg liver ROS, GERD  Medicated and Controlled,  Endo/Other  negative endocrine ROS  Renal/GU negative Renal ROS     Musculoskeletal  (+) Arthritis , Motor and sensation intact to right hand   Abdominal   Peds  Hematology negative hematology ROS (+)   Anesthesia Other  Findings Advanced osteoarthritis right shoulder  Reproductive/Obstetrics                           Anesthesia Physical Anesthesia Plan  ASA: IV  Anesthesia Plan: General and Regional   Post-op Pain Management: GA combined w/ Regional for post-op pain   Induction: Intravenous  PONV Risk Score and Plan: 3  Airway Management Planned: Oral ETT and Video Laryngoscope Planned  Additional Equipment:   Intra-op Plan:   Post-operative Plan: Extubation in OR  Informed Consent: I have reviewed the patients History and Physical, chart, labs and discussed the procedure including the risks, benefits and alternatives for the proposed anesthesia with the patient or authorized representative who has indicated his/her understanding and acceptance.     Dental advisory given  Plan Discussed with: CRNA  Anesthesia Plan Comments: (Reviewed PST note 08/12/18, Jodell Cipro, PA-C)      Anesthesia Quick Evaluation

## 2018-08-13 NOTE — Progress Notes (Addendum)
08-13-18 PCR result routed to Dr. Dub Mikes office for review. 07-02-18 EKG in [facility] and reviewed by Jodell Cipro, PA. Pt made aware of PCR result

## 2018-08-14 ENCOUNTER — Ambulatory Visit (HOSPITAL_COMMUNITY): Payer: Medicaid Other | Admitting: Physician Assistant

## 2018-08-14 ENCOUNTER — Encounter (HOSPITAL_COMMUNITY)
Admission: RE | Disposition: A | Discharge status: Home / Self Care | Payer: Self-pay | Source: Home / Self Care | Attending: Orthopedic Surgery

## 2018-08-14 ENCOUNTER — Inpatient Hospital Stay (HOSPITAL_COMMUNITY)
Admission: RE | Admit: 2018-08-14 | Discharge: 2018-08-15 | DRG: 483 | Disposition: A | Discharge status: Home / Self Care | Payer: Medicaid Other | Attending: Orthopedic Surgery | Admitting: Orthopedic Surgery

## 2018-08-14 ENCOUNTER — Ambulatory Visit (HOSPITAL_COMMUNITY): Payer: Medicaid Other | Admitting: Certified Registered"

## 2018-08-14 ENCOUNTER — Encounter (HOSPITAL_COMMUNITY): Payer: Self-pay | Admitting: *Deleted

## 2018-08-14 DIAGNOSIS — G629 Polyneuropathy, unspecified: Secondary | ICD-10-CM | POA: Diagnosis present

## 2018-08-14 DIAGNOSIS — H409 Unspecified glaucoma: Secondary | ICD-10-CM | POA: Diagnosis present

## 2018-08-14 DIAGNOSIS — M19011 Primary osteoarthritis, right shoulder: Principal | ICD-10-CM | POA: Diagnosis present

## 2018-08-14 DIAGNOSIS — J439 Emphysema, unspecified: Secondary | ICD-10-CM | POA: Diagnosis present

## 2018-08-14 DIAGNOSIS — Z7951 Long term (current) use of inhaled steroids: Secondary | ICD-10-CM

## 2018-08-14 DIAGNOSIS — K219 Gastro-esophageal reflux disease without esophagitis: Secondary | ICD-10-CM | POA: Diagnosis present

## 2018-08-14 DIAGNOSIS — Z87891 Personal history of nicotine dependence: Secondary | ICD-10-CM

## 2018-08-14 DIAGNOSIS — Z9981 Dependence on supplemental oxygen: Secondary | ICD-10-CM

## 2018-08-14 DIAGNOSIS — Z79899 Other long term (current) drug therapy: Secondary | ICD-10-CM

## 2018-08-14 DIAGNOSIS — Z981 Arthrodesis status: Secondary | ICD-10-CM

## 2018-08-14 DIAGNOSIS — F329 Major depressive disorder, single episode, unspecified: Secondary | ICD-10-CM | POA: Diagnosis present

## 2018-08-14 DIAGNOSIS — Z96611 Presence of right artificial shoulder joint: Secondary | ICD-10-CM

## 2018-08-14 HISTORY — PX: REVERSE SHOULDER ARTHROPLASTY: SHX5054

## 2018-08-14 SURGERY — ARTHROPLASTY, SHOULDER, TOTAL, REVERSE
Anesthesia: Regional | Site: Shoulder | Laterality: Right

## 2018-08-14 MED ORDER — ESMOLOL HCL 100 MG/10ML IV SOLN
INTRAVENOUS | Status: DC | PRN
  Administered 2018-08-14 (×3): 30 mg via INTRAVENOUS

## 2018-08-14 MED ORDER — ROCURONIUM BROMIDE 10 MG/ML (PF) SYRINGE
PREFILLED_SYRINGE | INTRAVENOUS | Status: AC
  Filled 2018-08-14: qty 10

## 2018-08-14 MED ORDER — LIDOCAINE 2% (20 MG/ML) 5 ML SYRINGE
INTRAMUSCULAR | Status: AC
  Filled 2018-08-14: qty 5

## 2018-08-14 MED ORDER — PROPOFOL 10 MG/ML IV BOLUS
INTRAVENOUS | Status: DC | PRN
  Administered 2018-08-14: 30 mg via INTRAVENOUS
  Administered 2018-08-14: 50 mg via INTRAVENOUS
  Administered 2018-08-14: 120 mg via INTRAVENOUS

## 2018-08-14 MED ORDER — KETOROLAC TROMETHAMINE 15 MG/ML IJ SOLN
15.0000 mg | Freq: Four times a day (QID) | INTRAMUSCULAR | Status: DC
  Administered 2018-08-14 – 2018-08-15 (×3): 15 mg via INTRAVENOUS
  Filled 2018-08-14 (×3): qty 1

## 2018-08-14 MED ORDER — ALBUTEROL SULFATE (2.5 MG/3ML) 0.083% IN NEBU: 2.5000 mg | INHALATION_SOLUTION | RESPIRATORY_TRACT | Status: DC | PRN

## 2018-08-14 MED ORDER — TRANEXAMIC ACID-NACL 1000-0.7 MG/100ML-% IV SOLN
1000.0000 mg | INTRAVENOUS | Status: AC
  Administered 2018-08-14: 1000 mg via INTRAVENOUS
  Filled 2018-08-14: qty 100

## 2018-08-14 MED ORDER — FENTANYL CITRATE (PF) 100 MCG/2ML IJ SOLN
INTRAMUSCULAR | Status: AC
  Filled 2018-08-14: qty 2

## 2018-08-14 MED ORDER — CHLORHEXIDINE GLUCONATE 4 % EX LIQD: 60.0000 mL | Freq: Once | CUTANEOUS | Status: DC

## 2018-08-14 MED ORDER — VANCOMYCIN HCL 1000 MG IV SOLR
INTRAVENOUS | Status: DC | PRN
  Administered 2018-08-14: 1000 mg via TOPICAL

## 2018-08-14 MED ORDER — MENTHOL 3 MG MT LOZG: 1.0000 | LOZENGE | OROMUCOSAL | Status: DC | PRN

## 2018-08-14 MED ORDER — ONDANSETRON HCL 4 MG/2ML IJ SOLN: 4.0000 mg | Freq: Four times a day (QID) | INTRAMUSCULAR | Status: DC | PRN

## 2018-08-14 MED ORDER — PANTOPRAZOLE SODIUM 40 MG PO TBEC
40.0000 mg | DELAYED_RELEASE_TABLET | Freq: Once | ORAL | Status: AC
  Administered 2018-08-14: 40 mg via ORAL
  Filled 2018-08-14: qty 1

## 2018-08-14 MED ORDER — ONDANSETRON HCL 4 MG PO TABS: 4.0000 mg | ORAL_TABLET | Freq: Four times a day (QID) | ORAL | Status: DC | PRN

## 2018-08-14 MED ORDER — FUROSEMIDE 20 MG PO TABS
20.0000 mg | ORAL_TABLET | Freq: Every day | ORAL | Status: DC
  Filled 2018-08-14: qty 1

## 2018-08-14 MED ORDER — PHENYLEPHRINE 40 MCG/ML (10ML) SYRINGE FOR IV PUSH (FOR BLOOD PRESSURE SUPPORT)
PREFILLED_SYRINGE | INTRAVENOUS | Status: DC | PRN
  Administered 2018-08-14: 80 ug via INTRAVENOUS
  Administered 2018-08-14 (×2): 120 ug via INTRAVENOUS

## 2018-08-14 MED ORDER — DOCUSATE SODIUM 100 MG PO CAPS
100.0000 mg | ORAL_CAPSULE | Freq: Two times a day (BID) | ORAL | Status: DC
  Administered 2018-08-14 – 2018-08-15 (×2): 100 mg via ORAL
  Filled 2018-08-14 (×2): qty 1

## 2018-08-14 MED ORDER — MAGNESIUM CITRATE PO SOLN: 1.0000 | Freq: Once | ORAL | Status: DC | PRN

## 2018-08-14 MED ORDER — ONDANSETRON HCL 4 MG/2ML IJ SOLN: 4.0000 mg | Freq: Once | INTRAMUSCULAR | Status: DC | PRN

## 2018-08-14 MED ORDER — FENTANYL CITRATE (PF) 100 MCG/2ML IJ SOLN: 25.0000 ug | INTRAMUSCULAR | Status: DC | PRN

## 2018-08-14 MED ORDER — BUPIVACAINE HCL (PF) 0.5 % IJ SOLN
INTRAMUSCULAR | Status: DC | PRN
  Administered 2018-08-14: 15 mL via PERINEURAL

## 2018-08-14 MED ORDER — BISACODYL 5 MG PO TBEC: 5.0000 mg | DELAYED_RELEASE_TABLET | Freq: Every day | ORAL | Status: DC | PRN

## 2018-08-14 MED ORDER — METHOCARBAMOL 500 MG PO TABS
750.0000 mg | ORAL_TABLET | ORAL | Status: DC
  Administered 2018-08-14 – 2018-08-15 (×5): 750 mg via ORAL
  Filled 2018-08-14 (×5): qty 2

## 2018-08-14 MED ORDER — BUPIVACAINE LIPOSOME 1.3 % IJ SUSP
INTRAMUSCULAR | Status: DC | PRN
  Administered 2018-08-14: 10 mL via PERINEURAL

## 2018-08-14 MED ORDER — LACTATED RINGERS IV SOLN
INTRAVENOUS | Status: DC | PRN
  Administered 2018-08-14: 16:00:00 via INTRAVENOUS

## 2018-08-14 MED ORDER — FENTANYL CITRATE (PF) 250 MCG/5ML IJ SOLN
INTRAMUSCULAR | Status: DC | PRN
  Administered 2018-08-14 (×2): 50 ug via INTRAVENOUS

## 2018-08-14 MED ORDER — ONDANSETRON HCL 4 MG/2ML IJ SOLN
INTRAMUSCULAR | Status: DC | PRN
  Administered 2018-08-14: 4 mg via INTRAVENOUS

## 2018-08-14 MED ORDER — IPRATROPIUM-ALBUTEROL 20-100 MCG/ACT IN AERS: 1.0000 | INHALATION_SPRAY | Freq: Two times a day (BID) | RESPIRATORY_TRACT | Status: DC

## 2018-08-14 MED ORDER — PHENYLEPHRINE HCL 10 MG/ML IJ SOLN
INTRAMUSCULAR | Status: AC
  Filled 2018-08-14: qty 2

## 2018-08-14 MED ORDER — SUGAMMADEX SODIUM 200 MG/2ML IV SOLN
INTRAVENOUS | Status: DC | PRN
  Administered 2018-08-14: 170 mg via INTRAVENOUS

## 2018-08-14 MED ORDER — SUVOREXANT 15 MG PO TABS: 15.0000 mg | ORAL_TABLET | Freq: Every day | ORAL | Status: DC

## 2018-08-14 MED ORDER — DEXAMETHASONE SODIUM PHOSPHATE 10 MG/ML IJ SOLN
INTRAMUSCULAR | Status: AC
  Filled 2018-08-14: qty 1

## 2018-08-14 MED ORDER — METOCLOPRAMIDE HCL 5 MG/ML IJ SOLN: 5.0000 mg | Freq: Three times a day (TID) | INTRAMUSCULAR | Status: DC | PRN

## 2018-08-14 MED ORDER — CEFAZOLIN SODIUM-DEXTROSE 2-4 GM/100ML-% IV SOLN
2.0000 g | INTRAVENOUS | Status: AC
  Administered 2018-08-14: 2 g via INTRAVENOUS
  Filled 2018-08-14: qty 100

## 2018-08-14 MED ORDER — DULOXETINE HCL 60 MG PO CPEP
60.0000 mg | ORAL_CAPSULE | Freq: Two times a day (BID) | ORAL | Status: DC
  Administered 2018-08-14 – 2018-08-15 (×2): 60 mg via ORAL
  Filled 2018-08-14 (×2): qty 1

## 2018-08-14 MED ORDER — OXYCODONE HCL 5 MG PO TABS: 5.0000 mg | ORAL_TABLET | ORAL | Status: DC | PRN

## 2018-08-14 MED ORDER — DIPHENHYDRAMINE HCL 12.5 MG/5ML PO ELIX: 12.5000 mg | ORAL_SOLUTION | ORAL | Status: DC | PRN

## 2018-08-14 MED ORDER — ONDANSETRON HCL 4 MG/2ML IJ SOLN
INTRAMUSCULAR | Status: AC
  Filled 2018-08-14: qty 2

## 2018-08-14 MED ORDER — MONTELUKAST SODIUM 10 MG PO TABS
10.0000 mg | ORAL_TABLET | Freq: Every day | ORAL | Status: DC
  Administered 2018-08-14: 10 mg via ORAL
  Filled 2018-08-14: qty 1

## 2018-08-14 MED ORDER — LORAZEPAM 0.5 MG PO TABS
0.5000 mg | ORAL_TABLET | Freq: Every day | ORAL | Status: DC
  Administered 2018-08-15: 0.5 mg via ORAL
  Filled 2018-08-14: qty 1

## 2018-08-14 MED ORDER — PHENYLEPHRINE 40 MCG/ML (10ML) SYRINGE FOR IV PUSH (FOR BLOOD PRESSURE SUPPORT)
PREFILLED_SYRINGE | INTRAVENOUS | Status: AC
  Filled 2018-08-14: qty 10

## 2018-08-14 MED ORDER — GUAIFENESIN ER 600 MG PO TB12
600.0000 mg | ORAL_TABLET | Freq: Two times a day (BID) | ORAL | Status: DC
  Administered 2018-08-14 – 2018-08-15 (×2): 600 mg via ORAL
  Filled 2018-08-14 (×2): qty 1

## 2018-08-14 MED ORDER — ZOLPIDEM TARTRATE 5 MG PO TABS
5.0000 mg | ORAL_TABLET | Freq: Every day | ORAL | Status: DC
  Administered 2018-08-14: 5 mg via ORAL
  Filled 2018-08-14: qty 1

## 2018-08-14 MED ORDER — SODIUM CHLORIDE 0.9 % IV SOLN
INTRAVENOUS | Status: DC | PRN
  Administered 2018-08-14: 30 ug/min via INTRAVENOUS

## 2018-08-14 MED ORDER — DEXAMETHASONE SODIUM PHOSPHATE 10 MG/ML IJ SOLN
INTRAMUSCULAR | Status: DC | PRN
  Administered 2018-08-14: 4 mg via INTRAVENOUS

## 2018-08-14 MED ORDER — LACTATED RINGERS IV SOLN
INTRAVENOUS | Status: DC
  Administered 2018-08-14: 14:00:00 via INTRAVENOUS

## 2018-08-14 MED ORDER — ACETAMINOPHEN 325 MG PO TABS
325.0000 mg | ORAL_TABLET | Freq: Four times a day (QID) | ORAL | Status: DC | PRN
  Administered 2018-08-14 – 2018-08-15 (×2): 650 mg via ORAL
  Filled 2018-08-14 (×2): qty 2

## 2018-08-14 MED ORDER — VANCOMYCIN HCL 1000 MG IV SOLR
INTRAVENOUS | Status: AC
  Filled 2018-08-14: qty 1000

## 2018-08-14 MED ORDER — ESMOLOL HCL 100 MG/10ML IV SOLN
INTRAVENOUS | Status: AC
  Filled 2018-08-14: qty 10

## 2018-08-14 MED ORDER — ROCURONIUM BROMIDE 10 MG/ML (PF) SYRINGE
PREFILLED_SYRINGE | INTRAVENOUS | Status: DC | PRN
  Administered 2018-08-14: 40 mg via INTRAVENOUS
  Administered 2018-08-14: 10 mg via INTRAVENOUS

## 2018-08-14 MED ORDER — OXYCODONE HCL 5 MG PO TABS: 10.0000 mg | ORAL_TABLET | ORAL | Status: DC | PRN

## 2018-08-14 MED ORDER — ACETAMINOPHEN 500 MG PO TABS
1000.0000 mg | ORAL_TABLET | Freq: Once | ORAL | Status: AC
  Administered 2018-08-14: 1000 mg via ORAL
  Filled 2018-08-14: qty 2

## 2018-08-14 MED ORDER — MIDAZOLAM HCL 2 MG/2ML IJ SOLN
INTRAMUSCULAR | Status: AC
  Administered 2018-08-14: 2 mg via INTRAVENOUS
  Filled 2018-08-14: qty 2

## 2018-08-14 MED ORDER — PROPOFOL 10 MG/ML IV BOLUS
INTRAVENOUS | Status: AC
  Filled 2018-08-14: qty 20

## 2018-08-14 MED ORDER — POLYETHYLENE GLYCOL 3350 17 G PO PACK: 17.0000 g | PACK | Freq: Every day | ORAL | Status: DC | PRN

## 2018-08-14 MED ORDER — PHENOL 1.4 % MT LIQD: 1.0000 | OROMUCOSAL | Status: DC | PRN

## 2018-08-14 MED ORDER — PANTOPRAZOLE SODIUM 40 MG PO TBEC
40.0000 mg | DELAYED_RELEASE_TABLET | Freq: Two times a day (BID) | ORAL | Status: DC
  Administered 2018-08-14 – 2018-08-15 (×2): 40 mg via ORAL
  Filled 2018-08-14 (×2): qty 1

## 2018-08-14 MED ORDER — 0.9 % SODIUM CHLORIDE (POUR BTL) OPTIME
TOPICAL | Status: DC | PRN
  Administered 2018-08-14: 1000 mL

## 2018-08-14 MED ORDER — LACTATED RINGERS IV SOLN
INTRAVENOUS | Status: DC
  Administered 2018-08-14: 19:00:00 via INTRAVENOUS

## 2018-08-14 MED ORDER — SUGAMMADEX SODIUM 200 MG/2ML IV SOLN
INTRAVENOUS | Status: AC
  Filled 2018-08-14: qty 2

## 2018-08-14 MED ORDER — STERILE WATER FOR IRRIGATION IR SOLN
Status: DC | PRN
  Administered 2018-08-14: 2000 mL

## 2018-08-14 MED ORDER — CARIPRAZINE HCL 1.5 MG PO CAPS
1.5000 mg | ORAL_CAPSULE | Freq: Every day | ORAL | Status: DC
  Administered 2018-08-15: 1.5 mg via ORAL
  Filled 2018-08-14: qty 1

## 2018-08-14 MED ORDER — FLUTICASONE FUROATE-VILANTEROL 100-25 MCG/INH IN AEPB
1.0000 | INHALATION_SPRAY | Freq: Every day | RESPIRATORY_TRACT | Status: DC
  Administered 2018-08-15: 1 via RESPIRATORY_TRACT
  Filled 2018-08-14: qty 28

## 2018-08-14 MED ORDER — METOCLOPRAMIDE HCL 5 MG PO TABS: 5.0000 mg | ORAL_TABLET | Freq: Three times a day (TID) | ORAL | Status: DC | PRN

## 2018-08-14 MED ORDER — LIDOCAINE 2% (20 MG/ML) 5 ML SYRINGE
INTRAMUSCULAR | Status: DC | PRN
  Administered 2018-08-14: 60 mg via INTRAVENOUS

## 2018-08-14 MED ORDER — HYDROMORPHONE HCL 1 MG/ML IJ SOLN: 0.5000 mg | INTRAMUSCULAR | Status: DC | PRN

## 2018-08-14 MED ORDER — ALUM & MAG HYDROXIDE-SIMETH 200-200-20 MG/5ML PO SUSP: 30.0000 mL | ORAL | Status: DC | PRN

## 2018-08-14 SURGICAL SUPPLY — 65 items
BAG ZIPLOCK 12X15 (MISCELLANEOUS) ×3 IMPLANT
BLADE SAW SGTL 83.5X18.5 (BLADE) ×3 IMPLANT
CLOSURE WOUND 1/2 X4 (GAUZE/BANDAGES/DRESSINGS)
COVER SURGICAL LIGHT HANDLE (MISCELLANEOUS) ×3 IMPLANT
COVER WAND RF STERILE (DRAPES) IMPLANT
CUP SUT UNIV REVERS 36 NEUTRAL (Cup) ×2 IMPLANT
DECANTER SPIKE VIAL GLASS SM (MISCELLANEOUS) ×3 IMPLANT
DERMABOND ADVANCED (GAUZE/BANDAGES/DRESSINGS) ×2
DERMABOND ADVANCED .7 DNX12 (GAUZE/BANDAGES/DRESSINGS) ×1 IMPLANT
DRAPE INCISE IOBAN 66X45 STRL (DRAPES) IMPLANT
DRAPE ORTHO SPLIT 77X108 STRL (DRAPES) ×4
DRAPE POUCH INSTRU U-SHP 10X18 (DRAPES) ×3 IMPLANT
DRAPE SURG 17X11 SM STRL (DRAPES) ×3 IMPLANT
DRAPE SURG ORHT 6 SPLT 77X108 (DRAPES) ×1 IMPLANT
DRAPE U-SHAPE 47X51 STRL (DRAPES) ×3 IMPLANT
DRSG AQUACEL AG ADV 3.5X10 (GAUZE/BANDAGES/DRESSINGS) ×3 IMPLANT
DURAPREP 26ML APPLICATOR (WOUND CARE) ×3 IMPLANT
ELECT BLADE TIP CTD 4 INCH (ELECTRODE) ×3 IMPLANT
ELECT REM PT RETURN 15FT ADLT (MISCELLANEOUS) ×3 IMPLANT
FACESHIELD WRAPAROUND (MASK) ×9 IMPLANT
FACESHIELD WRAPAROUND OR TEAM (MASK) ×3 IMPLANT
GLENOID UNI REV MOD 24 +2 LAT (Joint) ×2 IMPLANT
GLENOSPHERE 36 +4 LAT/24 (Joint) ×2 IMPLANT
GLOVE BIO SURGEON STRL SZ7.5 (GLOVE) ×3 IMPLANT
GLOVE BIO SURGEON STRL SZ8 (GLOVE) ×3 IMPLANT
GLOVE SS BIOGEL STRL SZ 7 (GLOVE) ×1 IMPLANT
GLOVE SS BIOGEL STRL SZ 7.5 (GLOVE) ×1 IMPLANT
GLOVE SUPERSENSE BIOGEL SZ 7 (GLOVE) ×2
GLOVE SUPERSENSE BIOGEL SZ 7.5 (GLOVE) ×2
GOWN STRL REUS W/ TWL XL LVL3 (GOWN DISPOSABLE) ×4 IMPLANT
GOWN STRL REUS W/TWL XL LVL3 (GOWN DISPOSABLE) ×4
KIT BASIN OR (CUSTOM PROCEDURE TRAY) ×3 IMPLANT
KIT TURNOVER KIT A (KITS) IMPLANT
LINER HUMERAL 36 +3MM SM (Shoulder) ×2 IMPLANT
MANIFOLD NEPTUNE II (INSTRUMENTS) ×3 IMPLANT
NDL TAPERED W/ NITINOL LOOP (MISCELLANEOUS) ×1 IMPLANT
NEEDLE TAPERED W/ NITINOL LOOP (MISCELLANEOUS) ×3 IMPLANT
NS IRRIG 1000ML POUR BTL (IV SOLUTION) ×6 IMPLANT
PACK SHOULDER (CUSTOM PROCEDURE TRAY) ×3 IMPLANT
PAD ARMBOARD 7.5X6 YLW CONV (MISCELLANEOUS) IMPLANT
PIN SET MODULAR GLENOID SYSTEM (PIN) ×2 IMPLANT
PROTECTOR NERVE ULNAR (MISCELLANEOUS) ×3 IMPLANT
SCREW CENTRAL MODULAR 25 (Screw) ×2 IMPLANT
SCREW PERI LOCK 5.5X16 (Screw) ×4 IMPLANT
SCREW PERI LOCK 5.5X24 (Screw) ×2 IMPLANT
SCREW PERIPHERAL 5.5X28 LOCK (Screw) ×2 IMPLANT
SLING ARM FOAM STRAP LRG (SOFTGOODS) ×3 IMPLANT
SLING ARM FOAM STRAP MED (SOFTGOODS) ×1 IMPLANT
SLING ARM IMMOBILIZER LRG (SOFTGOODS) ×2 IMPLANT
SPONGE LAP 4X18 RFD (DISPOSABLE) ×3 IMPLANT
STEM HUMERAL UNI REVERS SZ6 (Stem) ×2 IMPLANT
STRIP CLOSURE SKIN 1/2X4 (GAUZE/BANDAGES/DRESSINGS) IMPLANT
SUCTION FRAZIER HANDLE 12FR (TUBING) ×2
SUCTION TUBE FRAZIER 12FR DISP (TUBING) ×1 IMPLANT
SUT MNCRL AB 3-0 PS2 18 (SUTURE) ×3 IMPLANT
SUT VIC AB 1 CT1 36 (SUTURE) ×3 IMPLANT
SUT VIC AB 2-0 CT1 27 (SUTURE) ×2
SUT VIC AB 2-0 CT1 TAPERPNT 27 (SUTURE) ×1 IMPLANT
SUTURE TAPE 1.3 40 TPR END (SUTURE) ×1 IMPLANT
SUTURETAPE 1.3 40 TPR END (SUTURE) ×3
SYR 10ML LL (SYRINGE) ×3 IMPLANT
SYR 20CC LL (SYRINGE) IMPLANT
TOWEL OR 17X26 10 PK STRL BLUE (TOWEL DISPOSABLE) ×6 IMPLANT
TOWEL OR NON WOVEN STRL DISP B (DISPOSABLE) ×3 IMPLANT
WATER STERILE IRR 1000ML POUR (IV SOLUTION) ×9 IMPLANT

## 2018-08-14 NOTE — Anesthesia Procedure Notes (Signed)
Procedure Name: Intubation Date/Time: 08/14/2018 3:45 PM Performed by: Niel Hummer, CRNA Pre-anesthesia Checklist: Patient being monitored, Suction available, Emergency Drugs available and Patient identified Patient Re-evaluated:Patient Re-evaluated prior to induction Oxygen Delivery Method: Circle system utilized Preoxygenation: Pre-oxygenation with 100% oxygen Induction Type: IV induction Ventilation: Mask ventilation without difficulty Laryngoscope Size: Mac and 3 Grade View: Grade I Tube type: Oral Tube size: 7.0 mm Number of attempts: 1 Airway Equipment and Method: Stylet Placement Confirmation: ETT inserted through vocal cords under direct vision,  positive ETCO2 and breath sounds checked- equal and bilateral Secured at: 22 cm Tube secured with: Tape Dental Injury: Teeth and Oropharynx as per pre-operative assessment  Comments: Neck neutral and midline throughout intubation

## 2018-08-14 NOTE — Transfer of Care (Signed)
Immediate Anesthesia Transfer of Care Note  Patient: [name]  Procedure(s) Performed: REVERSE SHOULDER ARTHROPLASTY (Right Shoulder)  Patient Location: PACU  Anesthesia Type:General  Level of Consciousness: awake, alert  and oriented  Airway & Oxygen Therapy: Patient Spontanous Breathing and Patient connected to face mask oxygen  Post-op Assessment: Report given to RN and Post -op Vital signs reviewed and stable  Post vital signs: Reviewed and stable  Last Vitals:  Vitals Value Taken Time  BP 147/91 08/14/2018  5:35 PM  Temp    Pulse 109 08/14/2018  5:36 PM  Resp 22 08/14/2018  5:36 PM  SpO2 98 % 08/14/2018  5:36 PM  Vitals shown include unvalidated device data.  Last Pain:  Vitals:   08/14/18 1525  TempSrc:   PainSc: 4          Complications: No apparent anesthesia complications

## 2018-08-14 NOTE — Anesthesia Postprocedure Evaluation (Signed)
Anesthesia Post Note  Patient: [name]  Procedure(s) Performed: REVERSE SHOULDER ARTHROPLASTY (Right Shoulder)     Patient location during evaluation: PACU Anesthesia Type: Regional and General Level of consciousness: awake and alert Pain management: pain level controlled Vital Signs Assessment: post-procedure vital signs reviewed and stable Respiratory status: spontaneous breathing, nonlabored ventilation, respiratory function stable and patient connected to nasal cannula oxygen Cardiovascular status: blood pressure returned to baseline and stable Postop Assessment: no apparent nausea or vomiting Anesthetic complications: no    Last Vitals:  Vitals:   08/14/18 1826 08/14/18 2013  BP: 127/72 (!) 142/89  Pulse: (!) 108 (!) 104  Resp: 16 20  Temp: 36.7 C 36.9 C  SpO2: 91% 96%    Last Pain:  Vitals:   08/14/18 2013  TempSrc: Oral  PainSc:                  Catheryn Bacon Kaneisha Ellenberger

## 2018-08-14 NOTE — Progress Notes (Signed)
Assisted Dr. Bradley Ferris with right, ultrasound guided, interscalene  block. Monitors on throughout procedure. See vital signs in flow sheet. Alert and tolerated procedure well.

## 2018-08-14 NOTE — Anesthesia Procedure Notes (Signed)
Anesthesia Regional Block: Interscalene brachial plexus block   Pre-Anesthetic Checklist: ,, timeout performed, Correct Patient, Correct Site, Correct Laterality, Correct Procedure, Correct Position, site marked, Risks and benefits discussed,  Surgical consent,  Pre-op evaluation,  At surgeon's request and post-op pain management  Laterality: Right  Prep: chloraprep       Needles:  Injection technique: Single-shot  Needle Type: Echogenic Stimulator Needle     Needle Length: 9cm  Needle Gauge: 21     Additional Needles:   Procedures:,,,, ultrasound used (permanent image in chart),,,,  Narrative:  Start time: 08/14/2018 3:05 PM End time: 08/14/2018 3:15 PM Injection made incrementally with aspirations every 5 mL.  Performed by: Personally  Anesthesiologist: Leonides Grills, MD  Additional Notes: Functioning IV was confirmed and monitors were applied.  A timeout was performed. Sterile prep, hand hygiene and sterile gloves were used. A 40mm 21ga Arrow echogenic stimulator needle was used. Negative aspiration and negative test dose prior to incremental administration of local anesthetic. The patient tolerated the procedure well.  Ultrasound guidance: relevent anatomy identified, needle position confirmed, local anesthetic spread visualized around nerve(s), vascular puncture avoided.  Image printed for medical record.

## 2018-08-14 NOTE — Discharge Instructions (Signed)
° °Kevin M. Supple, M.D., F.A.A.O.S. °Orthopaedic Surgery °Specializing in Arthroscopic and Reconstructive °Surgery of the Shoulder and Knee °[phone] °[address] - [place], [place] [address] - Fax [phone] ° ° °POST-OP TOTAL SHOULDER REPLACEMENT INSTRUCTIONS ° °1. Call the office at [phone] to schedule your first post-op appointment 10-14 days from the date of your surgery. ° °2. The bandage over your incision is waterproof. You may begin showering with this dressing on. You may leave this dressing on until first follow up appointment within 2 weeks. We prefer you leave this dressing in place until follow up however after 5-7 days if you are having itching or skin irritation and would like to remove it you may do so. Go slow and tug at the borders gently to break the bond the dressing has with the skin. At this point if there is no drainage it is okay to go without a bandage or you may cover it with a light guaze and tape. You can also expect significant bruising around your shoulder that will drift down your arm and into your chest wall. This is very normal and should resolve over several days. ° ° 3. Wear your sling/immobilizer at all times except to perform the exercises below or to occasionally let your arm dangle by your side to stretch your elbow. You also need to sleep in your sling immobilizer until instructed otherwise. ° °4. Range of motion to your elbow, wrist, and hand are encouraged 3-5 times daily. Exercise to your hand and fingers helps to reduce swelling you may experience. ° °5. Utilize ice to the shoulder 3-5 times minimum a day and additionally if you are experiencing pain. ° °6. Prescriptions for a pain medication and a muscle relaxant are provided for you. It is recommended that if you are experiencing pain that you pain medication alone is not controlling, add the muscle relaxant along with the pain medication which can give additional pain relief. The first 1-2 days  is generally the most severe of your pain and then should gradually decrease. As your pain lessens it is recommended that you decrease your use of the pain medications to an "as needed basis'" only and to always comply with the recommended dosages of the pain medications. ° °7. Pain medications can produce constipation along with their use. If you experience this, the use of an over the counter stool softener or laxative daily is recommended.  ° °8. For additional questions or concerns, please do not hesitate to call the office. If after hours there is an answering service to forward your concerns to the physician on call. ° °9.Pain control following an exparel block ° °To help control your post-operative pain you received a nerve block  performed with Exparel which is a long acting anesthetic (numbing agent) which can provide pain relief and sensations of numbness (and relief of pain) in the operative shoulder and arm for up to 3 days. Sometimes it provides mixed relief, meaning you may still have numbness in certain areas of the arm but can still be able to move  parts of that arm, hand, and fingers. We recommend that your prescribed pain medications  be used as needed. We do not feel it is necessary to "pre medicate" and "stay ahead" of pain.  Taking narcotic pain medications when you are not having any pain can lead to unnecessary and potentially dangerous side effects.  ° °POST-OP EXERCISES ° °Pendulum Exercises ° °Perform pendulum exercises while standing and bending at   the waist. Support your uninvolved arm on a table or chair and allow your operated arm to hang freely. Make sure to do these exercises passively - not using you shoulder muscles. ° °Repeat 20 times. Do 3 sessions per day. ° ° ° ° °

## 2018-08-14 NOTE — Op Note (Signed)
OPERATIVE REPORT  PATIENT:   [name]  63 y.o. female DATE OF BIRTH: [birth year]  MEDICAL RECORD NO.: [id number]   DATE OF PROCEDURE:08/14/2018    PRE-OPERATIVE DIAGNOSIS:  advanced osteoarthritis right shoulder   POSTOPERATIVE DIAGNOSIS:   Same   PROCEDURE:   Right shoulder reverse arthroplasty utilizing a press-fit size 6 Arthrex stem with a +3 polyethylene insert, small/+2 baseplate and a 36/+4 glenosphere    PHYSICIAN: Vania Rea Rifky Lapre MD        ASSISTANT:  Lucita Lora. Shuford, P.A.-C.   ANESTHESIA:  A general endotracheal as well as interscalene block with Exparel   ESTIMATED BLOOD LOSS: 200 cc   DRAINS:   None  SPECIMENS: None  PATIENT DISPOSITION:  PACU - hemodynamically stable.  PLAN OF CARE: Admit for overnight observation   HISTORY: [name] is a 63 y.o. female who has had increasing pain and functional limitations related to end-stage right shoulder osteoarthritis and rotator cuff dysfunction. she is brought to the operating room, at this time for planned rightreverse shoulder arthroplasty.   Preoperatively, I counseled the patient regarding treatment options and risks versus benefits thereof.  Possible surgical complications were all reviewed including potential for bleeding, infection, neurovascular injury, persistent pain, loss of motion, anesthetic complication, failure of the implant, and possible need for additional surgery. They understand and accept and agrees with our planned procedure.   PROCEDURE IN DETAIL:  After undergoing routine preop evaluation, the patient received prophylactic antibiotics, and an interscalene block was established in the holding room by the anesthesia department with Exparel. The patient was brought to the operating room, placed supine on the operative Table,and underwent smooth induction of a general endotracheal anesthesia. Placed in the beach-chair position and appropriately padded and protected.  The  right shoulder girdle region was sterilely prepped and draped in standard fashion.  A time-out was called. And anterior deltopectoral approach to right shoulder was made through 8 cm incision. Skin flaps were elevated.  Dissection carried deeply.  Deltopectoral interval was then developed from proximal to distal with cephalic vein taken laterally.The upper centimeter of the pec major tendon was tenotomized for exposure. The conjoined tendon was mobilized and retracted medially.  The long head biceps tendon was identified and tenodesed at the upper border of the pec major tendon and then unroofed, tenotomized, and excised proximally.  We then divided the subscapularis away from its insertion upon the lesser tuberosity.  We then divided the capsular attachments from the anterior-inferior and inferior aspects of the humeral neck allowing delivery of the humeral head through the wound.  Some small residual portion of the rotator cuff superiorly was then divided and allowed complete delivery of the humeral head through the wound, which was markedly deformed.  We outlined our proposed humeral head resection with the extramedullary guide.  The humeral head resection was then completed with an oscillating saw.  We found several retained suture anchors and suture material within the humeral head from previous attempted rotator cuff repair.  These were removed. The osteophytes on the anterior and inferior margin of the neck were then removed.  A Metal cap was placed over the cut surface of the proximal humerus.  We then gained exposure of the glenoid with combination of Fukuda, pitchfork, and snake tongue retractors.  I performed a circumferential labral resection severe glenoid retroversion was found.  A Central guidepin was placed with an approximately 10 degree inferior tilt..  We then reamed to stable subchondral bone with first the central  then the peripheral reamer.  At this point the final  implant was assembled on the back table, and then was screwed into position obtaining excellent purchase and fixation.  We then placed the peripheral locking screws all of which obtained good purchase and fixation.  The final glenosphere was then inserted onto the baseplate, impacted, and then the locking screw was placed.  Wn this was completed, we then returned our attention back to the proximal humerus where the metal cap protecting the metaphysis was removed, we then hand reamed the canal up to size 6, broached to size 6, which again showed excellent fit and fixation.  The humeral metaphysis was then reamed with the central reaming guide.  Trial reduction at this point, showed good soft tissue balance and good fit.  At this point, we assembled our final prosthesis on the back table.  The trial was removed.  The canal was clean.  We placed vancomycin powder into the humeral medullary canal prior to implantation of the prosthesis. We then impacted the size 6 stem achieving excellent interference fit and fixation.  We then performed a series of trial reductions and ultimately found that + 3 off the stem gave [place] excellent soft tissue balance, so we inserted  a +3 poly, and a final reduction was performed, which showed excellent shoulder motion and good stability and good soft tissue balance.  The wound was then copiously irrigated. Hemostasis was obtained..  The deltopectoral interval was then reapproximated with a series of figure- of-eight #1 Vicryl sutures, 2-0 Monocryl was used for the subcu layer, intracuticular 3 oh Monocryl for the skin, followed by Dermabond and Aquacel dressing.  Right arm was then placed in a sling.  The patient was awakened, extubated, and taken to the recovery room in stable condition.   Ralene Bathe, P.A.-C, was used as an Geophysicist/field seismologist throughout this case, essential for help with positioning the patient, positioning the extremity, tissue manipulation, suture  management, implantation of prosthesis, wound closure, and intraoperative decision making.         Vania Rea Veleria Barnhardt, MD Contact # [phone]

## 2018-08-14 NOTE — H&P (Signed)
Mickie BailLinda D Runions    Chief Complaint: advanced osteoarthritis right shoulder HPI: The patient is a 63 y.o. female with end stage right shoulder OA.  Past Medical History:  Diagnosis Date  . Arthritis   . Asthma    Breo and Combivent daily  . CHF (congestive heart failure) (HCC)    takes Lasix daily  . Complication of anesthesia    slow to wake ; " everytime i have surgery my oxgen drops down below the 90s and it stays like that for sometime days after surgery "   . COPD (chronic obstructive pulmonary disease) (HCC)   . Decreased spinal mobility    cervical    . Depression    takes Wellbutrin and Citalopram daily  . Dyspnea    ongoing   . Emphysema lung (HCC)   . GERD (gastroesophageal reflux disease)    takes Omeprazole daily  . Glaucoma    mild"---unsure if correct , has heard that she has it and hear that she does not have it   . History of blood transfusion 1983   no abnormal reaction noted  . History of bronchitis 5-6wks ago  . Joint swelling   . Mood disorder (HCC)   . Multiple allergies    Fluticasone and Mucinex daily  . On home oxygen therapy    2.5L prn and at bedtime  . Peripheral neuropathy   . Pneumonia under the age of 63   hx of  . Vertigo    takes Meclizine daily as needed  . Weakness    numbness and tingling in right arm and leg    Past Surgical History:  Procedure Laterality Date  . ANTERIOR CERVICAL CORPECTOMY N/A 06/22/2015   Procedure: ANTERIOR CERVICAL DECOMPRESSION/DISCECTOMY FUSION C6 - C7 with corpectomy;  Surgeon: Donalee CitrinGary Cram, MD;  Location: [facility];  Service: Neurosurgery;  Laterality: N/A;  . BACK SURGERY     lumbar; i had 3 lower back surgeries   . NECK SURGERY    . POSTERIOR CERVICAL FUSION/FORAMINOTOMY  07/08/2015   Procedure: POSTERIOR CERVICAL FUSION/FORAMINOTOMY CERVICAL TWO TO CERVICAL SEVEN;  Surgeon: Donalee CitrinGary Cram, MD;  Location: [facility];  Service: Neurosurgery;;  . POSTERIOR FUSION CERVICAL SPINE  07/08/2015  . ROTATOR CUFF  REPAIR     right  . TONSILLECTOMY AND ADENOIDECTOMY    . TUBAL LIGATION      Family History  Problem Relation Age of Onset  . COPD Mother   . Heart disease Mother        Valve disease  . Allergies Mother   . Asthma Mother   . CAD Father 4733  . Emphysema Paternal Grandfather   . CAD Brother 50       Mild CAD    Social History:  reports that she quit smoking about 21 years ago. Her smoking use included cigarettes. She has a 15.00 pack-year smoking history. She has never used smokeless tobacco. She reports that she does not drink alcohol or use drugs.   Medications Prior to Admission  Medication Sig Dispense Refill  . acetaminophen (TYLENOL) 500 MG tablet Take 1,000 mg by mouth 2 (two) times daily.    . Cariprazine HCl (VRAYLAR PO) Take 1.5 mg by mouth daily.     . DULoxetine (CYMBALTA) 60 MG capsule Take 60 mg by mouth 2 (two) times daily.    . fluticasone furoate-vilanterol (BREO ELLIPTA) 100-25 MCG/INH AEPB Inhale 1 puff into the lungs daily. 1 each 0  . furosemide (LASIX) 20  MG tablet Take 1 tablet (20 mg total) by mouth daily. 90 tablet 3  . guaifenesin (HUMIBID E) 400 MG TABS tablet Take 400 mg by mouth 3 (three) times daily.     . indomethacin (INDOCIN SR) 75 MG CR capsule Take 75 mg by mouth 2 (two) times daily.    . Ipratropium-Albuterol (COMBIVENT RESPIMAT) 20-100 MCG/ACT AERS respimat Up to one puff every 4 hours if needed (Patient taking differently: Inhale 1 puff into the lungs 2 (two) times daily. ) 1 Inhaler 11  . LORazepam (ATIVAN) 0.5 MG tablet Take 0.5 mg by mouth daily.   0  . methocarbamol (ROBAXIN) 750 MG tablet Take 750 mg by mouth every 4 (four) hours.     . montelukast (SINGULAIR) 10 MG tablet Take 10 mg by mouth at bedtime.    . OXYGEN 2.5 L at bedtime with sleep and "when I am at home and I need it"    . pantoprazole (PROTONIX) 40 MG tablet Take 30- 60 min before your first and last meals of the day (Patient taking differently: Take 40 mg by mouth 2 (two)  times daily before a meal. Take 30-60 min before first and last meal of the day) 60 tablet 2  . polyethylene glycol (MIRALAX / GLYCOLAX) packet Take 17 g by mouth at bedtime.     Marland Kitchen. PREMPRO 0.3-1.5 MG per tablet Take 1 tablet by mouth daily.    . Suvorexant (BELSOMRA) 15 MG TABS Take 15 mg by mouth at bedtime.    Marland Kitchen. albuterol (PROVENTIL) (2.5 MG/3ML) 0.083% nebulizer solution Take 2.5 mg by nebulization every 4 (four) hours as needed for wheezing or shortness of breath.    . guaiFENesin-codeine 100-10 MG/5ML syrup Take 5 mLs by mouth every 12 (twelve) hours as needed for cough. 236 mL 0     Physical Exam: Right shoulder demonstrates painful and restricted motion as noted at recent office visits.  Her radiographs demonstrate severe posterior subluxation of the humeral head with a type B to glenoid  Vitals  Temp:  [97.8 F (36.6 C)] 97.8 F (36.6 C) (01/16 1336) Pulse Rate:  [98] 98 (01/16 1336) Resp:  [18] 18 (01/16 1336) BP: (167)/(83) 167/83 (01/16 1336) SpO2:  [99 %] 99 % (01/16 1336) Weight:  [83.5 kg] 83.5 kg (01/16 1336)  Assessment/Plan  Impression: advanced osteoarthritis right shoulder  Plan of Action: Procedure(s): REVERSE SHOULDER ARTHROPLASTY  Laura-Lee Villegas M Rob Mciver 08/14/2018, 2:54 PM Contact # [phone]

## 2018-08-15 ENCOUNTER — Other Ambulatory Visit: Payer: Self-pay

## 2018-08-15 DIAGNOSIS — F329 Major depressive disorder, single episode, unspecified: Secondary | ICD-10-CM | POA: Diagnosis present

## 2018-08-15 DIAGNOSIS — Z79899 Other long term (current) drug therapy: Secondary | ICD-10-CM | POA: Diagnosis not present

## 2018-08-15 DIAGNOSIS — Z87891 Personal history of nicotine dependence: Secondary | ICD-10-CM | POA: Diagnosis not present

## 2018-08-15 DIAGNOSIS — Z981 Arthrodesis status: Secondary | ICD-10-CM | POA: Diagnosis not present

## 2018-08-15 DIAGNOSIS — H409 Unspecified glaucoma: Secondary | ICD-10-CM | POA: Diagnosis present

## 2018-08-15 DIAGNOSIS — K219 Gastro-esophageal reflux disease without esophagitis: Secondary | ICD-10-CM | POA: Diagnosis present

## 2018-08-15 DIAGNOSIS — J439 Emphysema, unspecified: Secondary | ICD-10-CM | POA: Diagnosis present

## 2018-08-15 DIAGNOSIS — M19011 Primary osteoarthritis, right shoulder: Secondary | ICD-10-CM | POA: Diagnosis present

## 2018-08-15 DIAGNOSIS — G629 Polyneuropathy, unspecified: Secondary | ICD-10-CM | POA: Diagnosis present

## 2018-08-15 DIAGNOSIS — Z7951 Long term (current) use of inhaled steroids: Secondary | ICD-10-CM | POA: Diagnosis not present

## 2018-08-15 DIAGNOSIS — Z9981 Dependence on supplemental oxygen: Secondary | ICD-10-CM | POA: Diagnosis not present

## 2018-08-15 MED ORDER — OXYCODONE-ACETAMINOPHEN 5-325 MG PO TABS: 1.0000 | ORAL_TABLET | ORAL | 0 refills | Status: DC | PRN

## 2018-08-15 MED ORDER — ONDANSETRON HCL 4 MG PO TABS: 4.0000 mg | ORAL_TABLET | Freq: Three times a day (TID) | ORAL | 0 refills | Status: DC | PRN

## 2018-08-15 MED ORDER — METHOCARBAMOL 750 MG PO TABS: 750.0000 mg | ORAL_TABLET | ORAL | 1 refills | Status: AC

## 2018-08-15 MED ORDER — METHOCARBAMOL 750 MG PO TABS: 750.0000 mg | ORAL_TABLET | ORAL | 1 refills | Status: DC

## 2018-08-15 NOTE — Progress Notes (Signed)
Patient discharged to home w/ family. Given all belongings, instructions, prescriptions, equipment. Verbalized understanding of all instructions. Escorted to pov via w/c. 

## 2018-08-15 NOTE — Evaluation (Signed)
Occupational Therapy Evaluation Patient Details Name: [name] MRN: [id number] DOB: 01-28-1956 Today's Date: 08/15/2018    History of Present Illness  s/p  R RTSA. H/O CHF, COPD, peripheral neuropathy and vertigo   Clinical Impression   All education completed. Pt's nerve block still in effect. Gave seated pendulum option as pt has neck and back issues.  Her grandson will stay with her and assist as needed.  ADL also performed    Follow Up Recommendations  Follow surgeon's recommendation for DC plan and follow-up therapies    Equipment Recommendations  None recommended by OT    Recommendations for Other Services       Precautions / Restrictions        Mobility Bed Mobility               General bed mobility comments: min guard, rolling  Transfers Overall transfer level: Needs assistance Equipment used: 1 person hand held assist Transfers: Sit to/from Stand Sit to Stand: Min guard         General transfer comment: min A to walk to bathroom    Balance                                           ADL either performed or assessed with clinical judgement   ADL Overall ADL's : Needs assistance/impaired Eating/Feeding: Independent   Grooming: Set up   Upper Body Bathing: Moderate assistance   Lower Body Bathing: Moderate assistance   Upper Body Dressing : Moderate assistance   Lower Body Dressing: Maximal assistance   Toilet Transfer: Minimal assistance   Toileting- Clothing Manipulation and Hygiene: Minimal assistance         General ADL Comments: performed adl and toileting.  Educated on lap slides, which pt was able to do.  She may not be able to do pendulums due to back and neck issues.  Educated on seated cradling pendulums and educated on parameters for AROM as she is allowed to do this once block wears off     Vision         Perception     Praxis      Pertinent Vitals/Pain Pain Assessment: Faces Faces Pain Scale:  Hurts a little bit Pain Location: R shoulder Pain Descriptors / Indicators: Sore Pain Intervention(s): Limited activity within patient's tolerance;Monitored during session;Repositioned;Ice applied     Hand Dominance Right   Extremity/Trunk Assessment Upper Extremity Assessment Upper Extremity Assessment: RUE deficits/detail(block in effect.  Moves thumb and and first finger)           Communication Communication Communication: No difficulties   Cognition Arousal/Alertness: Awake/alert Behavior During Therapy: WFL for tasks assessed/performed Overall Cognitive Status: Within Functional Limits for tasks assessed                                     General Comments  pt had small skin tear on R forearm when washing:  tegraderm applied    Exercises     Shoulder Instructions      Home Living                                   Additional Comments: grandson will assist. She has a walk in shower  with a seat, high commode and uses a cane. Pt reports she is walking better than baseline      Prior Functioning/Environment                   OT Problem List:        OT Treatment/Interventions:      OT Goals(Current goals can be found in the care plan section) Acute Rehab OT Goals Patient Stated Goal: return to independence OT Goal Formulation: All assessment and education complete, DC therapy  OT Frequency:     Barriers to D/C:            Co-evaluation              AM-PAC OT "6 Clicks" Daily Activity     Outcome Measure Help from another person eating meals?: None Help from another person taking care of personal grooming?: A Little Help from another person toileting, which includes using toliet, bedpan, or urinal?: A Little Help from another person bathing (including washing, rinsing, drying)?: A Lot Help from another person to put on and taking off regular upper body clothing?: A Lot Help from another person to put on and  taking off regular lower body clothing?: A Lot 6 Click Score: 16   End of Session    Activity Tolerance: Patient tolerated treatment well Patient left: in bed;with call bell/phone within reach  OT Visit Diagnosis: Muscle weakness (generalized) (M62.81)                Time: 1610-96040934-1021 OT Time Calculation (min): 47 min Charges:  OT General Charges $OT Visit: 1 Visit OT Evaluation $OT Eval Low Complexity: 1 Low OT Treatments $Self Care/Home Management : 8-22 mins $Therapeutic Exercise: 8-22 mins  [name], [name] [facility] [phone] [facility] pager [phone] office 08/15/2018  [name] 08/15/2018, 1:48 PM

## 2018-08-15 NOTE — Discharge Summary (Signed)
PATIENT ID:      [name]  MRN:     [id number] DOB/AGE:    Feb 10, 1956 / 63 y.o.     DISCHARGE SUMMARY  ADMISSION DATE:    08/14/2018 DISCHARGE DATE:    ADMISSION DIAGNOSIS: advanced osteoarthritis right shoulder Past Medical History:  Diagnosis Date  . Arthritis   . Asthma    Breo and Combivent daily  . CHF (congestive heart failure) (HCC)    takes Lasix daily  . Complication of anesthesia    slow to wake ; " everytime i have surgery my oxgen drops down below the 90s and it stays like that for sometime days after surgery "   . COPD (chronic obstructive pulmonary disease) (HCC)   . Decreased spinal mobility    cervical    . Depression    takes Wellbutrin and Citalopram daily  . Dyspnea    ongoing   . Emphysema lung (HCC)   . GERD (gastroesophageal reflux disease)    takes Omeprazole daily  . Glaucoma    mild"---unsure if correct , has heard that she has it and hear that she does not have it   . History of blood transfusion 1983   no abnormal reaction noted  . History of bronchitis 5-6wks ago  . Joint swelling   . Mood disorder (HCC)   . Multiple allergies    Fluticasone and Mucinex daily  . On home oxygen therapy    2.5L prn and at bedtime  . Peripheral neuropathy   . Pneumonia under the age of 63   hx of  . Vertigo    takes Meclizine daily as needed  . Weakness    numbness and tingling in right arm and leg    DISCHARGE DIAGNOSIS:   Active Problems:   S/P reverse total shoulder arthroplasty, right   PROCEDURE: Procedure(s): REVERSE SHOULDER ARTHROPLASTY on 08/14/2018  CONSULTS:    HISTORY:  See H&P in chart.  HOSPITAL COURSE:  [name] is a 63 y.o. admitted on 08/14/2018 with a diagnosis of advanced osteoarthritis right shoulder.  They were brought to the operating room on 08/14/2018 and underwent Procedure(s): REVERSE SHOULDER ARTHROPLASTY.    They were given perioperative antibiotics:  Anti-infectives (From admission, onward)   Start     Dose/Rate  Route Frequency Ordered Stop   08/14/18 1654  vancomycin (VANCOCIN) powder  Status:  Discontinued       As needed 08/14/18 1654 08/14/18 1731   08/14/18 1345  ceFAZolin (ANCEF) IVPB 2g/100 mL premix     2 g 200 mL/hr over 30 Minutes Intravenous On call to O.R. 08/14/18 1342 08/14/18 1616    .  Patient underwent the above named procedure and tolerated it well. The following day they were hemodynamically stable and pain was controlled on oral analgesics. They were neurovascularly intact to the operative extremity. OT was ordered and worked with patient per protocol. They were medically and orthopaedically stable for discharge on day 1 .    DIAGNOSTIC STUDIES:  RECENT RADIOGRAPHIC STUDIES :  No results found.  RECENT VITAL SIGNS:   Patient Vitals for the past 24 hrs:  BP Temp Temp src Pulse Resp SpO2 Height Weight  08/15/18 0516 130/77 98.5 F (36.9 C) - (!) 110 20 98 % - -  08/15/18 0442 - - - (!) 106 - - - -  08/15/18 0054 - - - (!) 118 - 96 % - -  08/15/18 0052 (!) 149/85 98.1 F (36.7 C) - Marland Kitchen(!)  121 20 93 % - -  08/14/18 2240 (!) 144/89 (!) 97.5 F (36.4 C) - (!) 103 20 96 % - -  08/14/18 2134 (!) 151/90 98 F (36.7 C) Oral (!) 109 20 93 % - -  08/14/18 2013 (!) 142/89 98.5 F (36.9 C) Oral (!) 104 20 96 % - -  08/14/18 1826 127/72 98 F (36.7 C) Oral (!) 108 16 91 % - -  08/14/18 1815 135/85 99.2 F (37.3 C) - (!) 109 (!) 22 93 % - -  08/14/18 1800 131/85 - - (!) 110 (!) 22 93 % - -  08/14/18 1745 131/75 - - (!) 109 (!) 22 92 % - -  08/14/18 1735 (!) 147/91 99 F (37.2 C) - (!) 109 (!) 23 98 % - -  08/14/18 1530 - - - (!) 102 20 97 % - -  08/14/18 1525 (!) 137/96 - - 100 12 96 % - -  08/14/18 1520 (!) 158/91 - - (!) 102 20 97 % - -  08/14/18 1519 - - - (!) 102 13 96 % - -  08/14/18 1518 - - - 100 (!) 23 96 % - -  08/14/18 1517 - - - (!) 105 (!) 25 96 % - -  08/14/18 1516 - - - (!) 101 (!) 26 97 % - -  08/14/18 1515 (!) 148/90 - - (!) 102 17 97 % - -  08/14/18 1514 - -  - (!) 101 (!) 23 96 % - -  08/14/18 1513 - - - (!) 104 (!) 24 96 % - -  08/14/18 1512 - - - (!) 103 20 99 % - -  08/14/18 1511 - - - (!) 109 19 97 % - -  08/14/18 1510 (!) 142/87 - - (!) 108 20 98 % - -  08/14/18 1509 - - - (!) 108 (!) 22 97 % - -  08/14/18 1508 - - - (!) 107 19 96 % - -  08/14/18 1507 - - - (!) 106 (!) 24 98 % - -  08/14/18 1506 - - - (!) 106 15 97 % - -  08/14/18 1505 (!) 154/91 - - (!) 102 (!) 21 96 % - -  08/14/18 1504 - - - 99 (!) 24 97 % - -  08/14/18 1503 - - - (!) 103 (!) 22 97 % - -  08/14/18 1502 - - - (!) 104 (!) 21 97 % - -  08/14/18 1501 - - - (!) 107 (!) 21 97 % - -  08/14/18 1500 (!) 165/94 - - 98 20 97 % - -  08/14/18 1336 (!) 167/83 97.8 F (36.6 C) Oral 98 18 99 % 5\' 2"  (1.575 m) 83.5 kg  .  RECENT EKG RESULTS:    Orders placed or performed in visit on 07/02/18  . EKG 12-Lead    DISCHARGE INSTRUCTIONS:    DISCHARGE MEDICATIONS:   Allergies as of 08/15/2018      Reactions   Azithromycin Nausea And Vomiting      Medication List    TAKE these medications   acetaminophen 500 MG tablet Commonly known as:  TYLENOL Take 1,000 mg by mouth 2 (two) times daily.   albuterol (2.5 MG/3ML) 0.083% nebulizer solution Commonly known as:  PROVENTIL Take 2.5 mg by nebulization every 4 (four) hours as needed for wheezing or shortness of breath.   BELSOMRA 15 MG Tabs Generic drug:  Suvorexant Take 15 mg by mouth at bedtime.  DULoxetine 60 MG capsule Commonly known as:  CYMBALTA Take 60 mg by mouth 2 (two) times daily.   fluticasone furoate-vilanterol 100-25 MCG/INH Aepb Commonly known as:  BREO ELLIPTA Inhale 1 puff into the lungs daily.   furosemide 20 MG tablet Commonly known as:  LASIX Take 1 tablet (20 mg total) by mouth daily.   guaifenesin 400 MG Tabs tablet Commonly known as:  HUMIBID E Take 400 mg by mouth 3 (three) times daily.   guaiFENesin-codeine 100-10 MG/5ML syrup Take 5 mLs by mouth every 12 (twelve) hours as needed for  cough.   indomethacin 75 MG CR capsule Commonly known as:  INDOCIN SR Take 75 mg by mouth 2 (two) times daily.   Ipratropium-Albuterol 20-100 MCG/ACT Aers respimat Commonly known as:  COMBIVENT RESPIMAT Up to one puff every 4 hours if needed What changed:    how much to take  how to take this  when to take this  additional instructions   LORazepam 0.5 MG tablet Commonly known as:  ATIVAN Take 0.5 mg by mouth daily.   methocarbamol 750 MG tablet Commonly known as:  ROBAXIN Take 1 tablet (750 mg total) by mouth every 4 (four) hours.   montelukast 10 MG tablet Commonly known as:  SINGULAIR Take 10 mg by mouth at bedtime.   ondansetron 4 MG tablet Commonly known as:  ZOFRAN Take 1 tablet (4 mg total) by mouth every 8 (eight) hours as needed for nausea or vomiting.   oxyCODONE-acetaminophen 5-325 MG tablet Commonly known as:  PERCOCET Take 1 tablet by mouth every 4 (four) hours as needed (max 6 q).   OXYGEN 2.5 L at bedtime with sleep and "when I am at home and I need it"   pantoprazole 40 MG tablet Commonly known as:  PROTONIX Take 30- 60 min before your first and last meals of the day What changed:    how much to take  how to take this  when to take this  additional instructions   polyethylene glycol packet Commonly known as:  MIRALAX / GLYCOLAX Take 17 g by mouth at bedtime.   PREMPRO 0.3-1.5 MG tablet Generic drug:  estrogen (conjugated)-medroxyprogesterone Take 1 tablet by mouth daily.   VRAYLAR PO Take 1.5 mg by mouth daily.       FOLLOW UP VISIT:   Follow-up Information    Francena Hanly, MD.   Specialty:  Orthopedic Surgery Why:  call to be seen in 10-14 days Contact information: [address] [place] [address] [phone]           DISCHARGE TO: Home   DISCHARGE CONDITION:  [name] for Dr. Francena Hanly 08/15/2018, 8:59 AM

## 2018-08-15 NOTE — Care Management Note (Signed)
Case Management Note  Patient Details  Name: [name] MRN: [id number] Date of Birth: May 21, 1956  Subjective/Objective: s/p  R reverse total shoulder arthroplasty. From home. No CM needs.                  Action/Plan:d/c home.   Expected Discharge Date:  08/15/18               Expected Discharge Plan:  Home/Self Care  In-House Referral:     Discharge planning Services  CM Consult  Post Acute Care Choice:    Choice offered to:     DME Arranged:    DME Agency:     HH Arranged:    HH Agency:     Status of Service:  In process, will continue to follow  If discussed at Long Length of Stay Meetings, dates discussed:    Additional Comments:  Lanier Clam, RN 08/15/2018, 11:14 AM

## 2018-08-18 ENCOUNTER — Encounter (HOSPITAL_COMMUNITY): Payer: Self-pay | Admitting: Orthopedic Surgery

## 2018-09-02 ENCOUNTER — Ambulatory Visit: Payer: Medicaid Other | Admitting: Internal Medicine

## 2019-07-29 ENCOUNTER — Other Ambulatory Visit: Payer: Self-pay | Admitting: Internal Medicine

## 2019-07-29 MED ORDER — COMBIVENT RESPIMAT 20-100 MCG/ACT IN AERS: INHALATION_SPRAY | RESPIRATORY_TRACT | 0 refills | Status: DC

## 2019-08-31 ENCOUNTER — Other Ambulatory Visit: Payer: Self-pay | Admitting: Internal Medicine

## 2019-10-15 ENCOUNTER — Other Ambulatory Visit: Payer: Self-pay | Admitting: Family Medicine

## 2019-11-03 ENCOUNTER — Other Ambulatory Visit: Payer: Self-pay | Admitting: Family Medicine

## 2019-11-28 ENCOUNTER — Other Ambulatory Visit: Payer: Self-pay | Admitting: Family Medicine

## 2019-11-30 ENCOUNTER — Telehealth: Payer: Self-pay

## 2019-11-30 NOTE — Telephone Encounter (Signed)
LM stating that she is due for a med f.up apt with Dr. Sedalia Muta. I LM asking the pt to call back to set up an apt.

## 2019-12-22 ENCOUNTER — Telehealth: Payer: Self-pay | Admitting: Cardiology

## 2019-12-22 NOTE — Telephone Encounter (Signed)
Agree 

## 2019-12-22 NOTE — Telephone Encounter (Signed)
STAT if HR is under 50 or over 120 (normal HR is 60-100 beats per minute)  1) What is your heart rate? Between 145 and 181 now it is 130  2) Do you have a log of your heart rate readings (document readings)  3) Do you have any other symptoms?

## 2019-12-22 NOTE — Progress Notes (Deleted)
Cardiology Office Note   Date:  12/22/2019   ID:  [name], DOB [birth year], MRN [id number]  PCP:  Blane Ohara, MD  Cardiologist:  Rollene Rotunda, MD EP: None  No chief complaint on file.     History of Present Illness: [name] is a 64 y.o. female with PMH of chronic diastolic CHF, who presents for ***  She was last evaluated by cardiology at an outpatient visit with Dr. Antoine Poche 06/2018 for preoperative assessment. She had some complaints of SOB and a lexiscan myoview was recommended. NST showed EF 40% without ischemia, though deemed an intermediate risk study due to LV dysfunction. She had an echocardiogram which showed EF 55-60%, G1DD, no RWMA, and no significant valvular abnormalities. She was subsequently cleared for shoulder surgery.   She contacted our office to report elevated HR's. Per the telephone note 12/22/19, she reported HR's elevated to the 130s-150s and was advised to go to the ED.   1. Elevated Hrs:  2. Chronic diastolic CHF:     Past Medical History:  Diagnosis Date  . Arthritis   . Asthma    Breo and Combivent daily  . CHF (congestive heart failure) (HCC)    takes Lasix daily  . Complication of anesthesia    slow to wake ; " everytime i have surgery my oxgen drops down below the 90s and it stays like that for sometime days after surgery "   . COPD (chronic obstructive pulmonary disease) (HCC)   . Decreased spinal mobility    cervical    . Depression    takes Wellbutrin and Citalopram daily  . Dyspnea    ongoing   . Emphysema lung (HCC)   . GERD (gastroesophageal reflux disease)    takes Omeprazole daily  . Glaucoma    mild"---unsure if correct , has heard that she has it and hear that she does not have it   . History of blood transfusion 1983   no abnormal reaction noted  . History of bronchitis 5-6wks ago  . Joint swelling   . Mood disorder (HCC)   . Multiple allergies    Fluticasone and Mucinex daily  . On home oxygen therapy     2.5L prn and at bedtime  . Peripheral neuropathy   . Pneumonia under the age of 84   hx of  . Vertigo    takes Meclizine daily as needed  . Weakness    numbness and tingling in right arm and leg    Past Surgical History:  Procedure Laterality Date  . ANTERIOR CERVICAL CORPECTOMY N/A 06/22/2015   Procedure: ANTERIOR CERVICAL DECOMPRESSION/DISCECTOMY FUSION C6 - C7 with corpectomy;  Surgeon: Donalee Citrin, MD;  Location: [facility];  Service: Neurosurgery;  Laterality: N/A;  . BACK SURGERY     lumbar; i had 3 lower back surgeries   . NECK SURGERY    . POSTERIOR CERVICAL FUSION/FORAMINOTOMY  07/08/2015   Procedure: POSTERIOR CERVICAL FUSION/FORAMINOTOMY CERVICAL TWO TO CERVICAL SEVEN;  Surgeon: Donalee Citrin, MD;  Location: [facility];  Service: Neurosurgery;;  . POSTERIOR FUSION CERVICAL SPINE  07/08/2015  . REVERSE SHOULDER ARTHROPLASTY Right 08/14/2018   Procedure: REVERSE SHOULDER ARTHROPLASTY;  Surgeon: Francena Hanly, MD;  Location: [facility];  Service: Orthopedics;  Laterality: Right;   . ROTATOR CUFF REPAIR     right  . TONSILLECTOMY AND ADENOIDECTOMY    . TUBAL LIGATION       Current Outpatient Medications  Medication Sig Dispense  Refill  . acetaminophen (TYLENOL) 500 MG tablet Take 1,000 mg by mouth 2 (two) times daily.    Marland Kitchen albuterol (PROVENTIL) (2.5 MG/3ML) 0.083% nebulizer solution Take 2.5 mg by nebulization every 4 (four) hours as needed for wheezing or shortness of breath.    . Cariprazine HCl (VRAYLAR PO) Take 1.5 mg by mouth daily.     . COMBIVENT RESPIMAT 20-100 MCG/ACT AERS respimat UP TO ONE PUFF EVERY 4 HOURS IF NEEDED 4 g 3  . DULoxetine (CYMBALTA) 60 MG capsule Take 60 mg by mouth 2 (two) times daily.    . fluticasone furoate-vilanterol (BREO ELLIPTA) 100-25 MCG/INH AEPB Inhale 1 puff into the lungs daily. 1 each 0  . furosemide (LASIX) 20 MG tablet Take 1 tablet (20 mg total) by mouth daily. 90 tablet 3  . guaifenesin (HUMIBID E) 400 MG TABS tablet Take 400 mg  by mouth 3 (three) times daily.     Marland Kitchen guaiFENesin-codeine 100-10 MG/5ML syrup Take 5 mLs by mouth every 12 (twelve) hours as needed for cough. 236 mL 0  . indomethacin (INDOCIN SR) 75 MG CR capsule Take 75 mg by mouth 2 (two) times daily.    Marland Kitchen LORazepam (ATIVAN) 0.5 MG tablet Take 0.5 mg by mouth daily.   0  . methocarbamol (ROBAXIN) 750 MG tablet Take 1 tablet (750 mg total) by mouth every 4 (four) hours. 40 tablet 1  . montelukast (SINGULAIR) 10 MG tablet Take 10 mg by mouth at bedtime.    . ondansetron (ZOFRAN) 4 MG tablet Take 1 tablet (4 mg total) by mouth every 8 (eight) hours as needed for nausea or vomiting. 10 tablet 0  . oxyCODONE-acetaminophen (PERCOCET) 5-325 MG tablet Take 1 tablet by mouth every 4 (four) hours as needed (max 6 q). 30 tablet 0  . OXYGEN 2.5 L at bedtime with sleep and "when I am at home and I need it"    . pantoprazole (PROTONIX) 40 MG tablet Take 30- 60 min before your first and last meals of the day (Patient taking differently: Take 40 mg by mouth 2 (two) times daily before a meal. Take 30-60 min before first and last meal of the day) 60 tablet 2  . polyethylene glycol (MIRALAX / GLYCOLAX) packet Take 17 g by mouth at bedtime.     Marland Kitchen PREMPRO 0.3-1.5 MG per tablet Take 1 tablet by mouth daily.    . Suvorexant (BELSOMRA) 15 MG TABS Take 15 mg by mouth at bedtime.     No current facility-administered medications for this visit.    Allergies:   Azithromycin    Social History:  The patient  reports that she quit smoking about 22 years ago. Her smoking use included cigarettes. She has a 15.00 pack-year smoking history. She has never used smokeless tobacco. She reports that she does not drink alcohol or use drugs.   Family History:  The patient's ***family history includes Allergies in her mother; Asthma in her mother; CAD (age of onset: 56) in her father; CAD (age of onset: 41) in her brother; COPD in her mother; Emphysema in her paternal grandfather; Heart disease in  her mother.    ROS:  Please see the history of present illness.   Otherwise, review of systems are positive for {NONE DEFAULTED:18576::"none"}.   All other systems are reviewed and negative.    PHYSICAL EXAM: VS:  There were no vitals taken for this visit. , BMI There is no height or weight on file to calculate BMI. GEN:  Well nourished, well developed, in no acute distress HEENT: normal Neck: no JVD, carotid bruits, or masses Cardiac: ***RRR; no murmurs, rubs, or gallops,no edema  Respiratory:  clear to auscultation bilaterally, normal work of breathing GI: soft, nontender, nondistended, + BS MS: no deformity or atrophy Skin: warm and dry, no rash Neuro:  Strength and sensation are intact Psych: euthymic mood, full affect   EKG:  EKG {ACTION; IS/IS GNO:03704888} ordered today. The ekg ordered today demonstrates ***   Recent Labs: No results found for requested labs within last 8760 hours.    Lipid Panel    Component Value Date/Time   CHOL 210 (H) 08/17/2013 0928   TRIG 299.0 (H) 08/17/2013 0928   HDL 38.50 (L) 08/17/2013 0928   CHOLHDL 5 08/17/2013 0928   VLDL 59.8 (H) 08/17/2013 0928   LDLDIRECT 139.9 08/17/2013 0928      Wt Readings from Last 3 Encounters:  08/14/18 184 lb (83.5 kg)  07/10/18 193 lb (87.5 kg)  07/08/18 192 lb (87.1 kg)      Other studies Reviewed: Additional studies/ records that were reviewed today include: ***.    ASSESSMENT AND PLAN:  1.  ***   Current medicines are reviewed at length with the patient today.  The patient {ACTIONS; HAS/DOES NOT HAVE:19233} concerns regarding medicines.  The following changes have been made:  {PLAN; NO CHANGE:13088:s}  Labs/ tests ordered today include: *** No orders of the defined types were placed in this encounter.    Disposition:   FU with *** in {gen number 9-16:945038} {Days to years:10300}  Signed, Beatriz Stallion, PA-C  12/22/2019 3:01 PM

## 2019-12-22 NOTE — Telephone Encounter (Signed)
Spoke to patient she stated her heart started beating fast and irregular since yesterday around 6:30 pm.Stated she feels awful,tired,nauseated.Heart rate at present 138 to 158.Advised to go to [facility] ED.Trish notified.

## 2019-12-23 ENCOUNTER — Ambulatory Visit: Payer: Medicaid Other | Admitting: Medical

## 2019-12-23 ENCOUNTER — Telehealth: Payer: Self-pay | Admitting: *Deleted

## 2019-12-23 NOTE — Telephone Encounter (Signed)
Called to follow up on visit scheduled today. Patient was advised to go to ED yesterday, will cancel appointment so she will not be charged for no show. When patient calls back will needs follow up visit

## 2020-03-28 ENCOUNTER — Telehealth: Payer: Self-pay

## 2020-03-28 NOTE — Telephone Encounter (Signed)
Vernona Rieger at [facility] checking status of CMN faxed on 03/21/20 to be signed and faxed to [phone].  Vernona Rieger ph [phone].  For recert for pulse oxometer. Please fax hard copy & choose "fit to page" so signature will show on document.

## 2020-12-06 ENCOUNTER — Ambulatory Visit: Payer: Medicaid Other | Admitting: Internal Medicine

## 2020-12-23 ENCOUNTER — Ambulatory Visit: Payer: Medicaid Other | Admitting: Internal Medicine

## 2020-12-23 NOTE — Progress Notes (Deleted)
Subjective:    Patient ID: [name], female   DOB: 22-Feb-1956,     MRN: [id number]    Brief patient profile:  57    yowf quit smoking 1999 with cough that resolved and eval by Dr Delford Field in [place] with dx GOLD I copd  Vs AB assoc  seasonal allergy.   History of Present Illness  10/13/2015 1st  office visit/ [name]  rx  Combivent/ no saba  Chief Complaint  Patient presents with  . Follow-up    Former patient of Dr Delford Field. She c/o increased SOB and cough x 2 wks. She states that she is SOB with or without any exertion. She does not have a rescue inhaler.   not using 02 at all since 1st Feb 2017  Doe x [facility] = can't walk a nl pace on a flat grade s sob More cough at hs esp x 2 weeks  rec Plan A = Automatic = Symbicort 160 Take 2 puffs first thing in am and then another 2 puffs about 12 hours later.  Plan B = Backup Only use your combivent as a rescue medication    01/25/2016  f/u ov/[name] re: GOLD I copd / breo 200 and combivent x 2 pffs  Chief Complaint  Patient presents with  . Follow-up    Pt states her breathing is about the same. She has had minimal cough- prod with clear sputum.    doe = MMRC1 = can walk nl pace, flat grade, can't hurry or go uphills or steps s sob   Wants to know if allergic to cats, considering buying one  No noct cough/ no copd flares since last ov  rec Plan A = Automatic = Stiolto 2 puffs each am instead of Breo  Plan B = Backup Only use your combivent as a rescue medication  Please remember to go to the lab  department downstairs for your tests - we will call you with the results when they are available.     04/25/2016  f/u ov/[name] re: GOLD I  On breo 200 and prn combivent Chief Complaint  Patient presents with  . Follow-up    Breathing was worse with stiolto, so started back on breo. She had been doing better until a few days ago, started having increased SOB and sneezing- relates to allergies. She is using combivent 2 x per day on average.    confused re approp use of prns and on breo 200 now  rec Plan A = Automatic = Breo  ONE CLICK each am x 2 drags  Plan B = Backup Only use your combivent s a rescue medication     04/24/2018  f/u ov/[name] re:  GOLD I  Chief Complaint  Patient presents with  . Acute Visit    Pt states her breathing has been worse since she ran stopped Breo 2 months ago. She states her insurance stopped covering med. She is using combivent 3 x daily on average.   Dyspnea:  MMRC3 = can't walk 100 yards even at a slow pace at a flat grade s stopping due to sob   Cough: dry cough/ mostly daytime Sleeping: flat bed/ 2 pillows  SABA use: variably over using combivent since ran out of breo / prednisone for shoulder helped the breathing only a little, may have helped the cough more  02:  2lpm at bedtime and as needed but not desated   rec Plan A = Automatic =  Breo one click each am  x 2 good smooth deep drags  Plan B = Backup Only use your albuterol /ipatropium (combivent) as a rescue medication Plan C = Crisis - only use your albuterol nebulizer if you first try Plan B      06/03/2018  f/u ov/[name] re: GOLD I  / still on BREO with  worse cough / still doe  Chief Complaint  Patient presents with  . Follow-up    Breathing has been worse "since I went off combivent" she is now using it about 2 x per day.   Dyspnea:  MMRC3 = can't walk 100 yards even at a slow pace at a flat grade s stopping due to sob   Cough: more x 10 days / more with walking / min mucoid production Sleeping: 30 degrees due to R shoulder  SABA use: twice daily  - was not  told she could take the combivent up to every 4 h if needed 02: 2lpm and prn daytime   rec Plan A = Automatic = Bevespi Take 2 puffs first thing in am and then another 2 puffs about 12 hours later.  Work on inhaler technique:  Plan B - Backup  -   ok to use the nebulizer up to every 4 hours if you must but if start needing it regularly call for immediate  appointment    NP eval   06/10/18 Prednisone 10mg  tablet  >>>4 tabs for 2 days, then 3 tabs for 2 days, 2 tabs for 2 days, then 1 tab for 2 days, then stop  Doxycycline 100 mg >>> 1   tablet every 12 hours for 5 days Bevespi Aerosphere inhaler Only use your albuterol nebulizer as a rescue medication     06/25/2018 acute extended ov/[name] re: "no better"  Sob and  cough still present but mucus turned clear p doxy, did not help sob  Back on breo 100 x 11/25 with combivent up to 12 q12h - thoroughly confused with details of care Chief Complaint  Patient presents with  . Acute Visit    Breathing has been worse since last visit. She states she is SOB with exertion such as lobby to exam room today. She is coughing with white sputum. She states she has coughed until she "passed out and hit the floor". She is using her albuterol inhaler 2 x daily and albuterol neb about 6 x per day on average.   even nebulizer doesn't help much now with sob  X room to room>> sob  Much worse during coughing fits / better when uses codeine containing cough syrups Coughing so hard she feels she's going to pass out and does pee on herself as well  Still sleeping at 30 degrees due to sob/ cough  Back on breo x sev days and no better  rec Stop prilosec for now Start protonix 40 mg Take 30- 60 min before your first and last meals of the day  GERD  Plan A = Automatic = BREO 100 one click each am x 2 good drags then rinse and gargle Plan B = Backup Only use your combivent  inhaler as a rescue medication  Plan C = Crisis - only use your albuterol nebulizer if you first try Plan B   For cough > codeine cough syrup  1-2 tsp every 4 hours with goal = no cough at all  up office visit in 2 weeks, sooner if needed  with all medications /inhalers/ solutions in hand so we can verify exactly  what you are taking. This includes all medications from all doctors and over the counters - also bring your drug formulary for  options    07/08/2018  f/u ov/[name] re: copd 1 / "breo dep"  @ 100 x one click each am / combivent bid  Chief Complaint  Patient presents with  . Follow-up    Breathing has improved. She is using her combivent 2 x daily. She has not used her neb in the past wk.    Dyspnea:  MMRC3 = can't walk 100 yards even at a slow pace at a flat grade s stopping due to sob   Cough: gone / no cough med needed  for 3 days Sleeping: still up 30 degrees due to R shoulder pain not sob  SABA use: twice daily combivent  02: 2.5 lpm at home not qualified for amb 02   rec No change in medications  Continue protonix 40 mg Take 30- 60 min before your first and last meals of the day  Change the combivent up to one puff every 4 hours and follow with nebulizer if needed  You are cleared for surgery  Please schedule a follow up office visit in 6 weeks, call sooner if needed with all medications /inhalers/ solutions in hand   12/23/2020  f/u ov/[name] re:  No chief complaint on file.   Dyspnea:  *** Cough: *** Sleeping: *** SABA use: *** 02: *** Covid status:   ***   No obvious day to day or daytime variability or assoc excess/ purulent sputum or mucus plugs or hemoptysis or cp or chest tightness, subjective wheeze or overt sinus or hb symptoms.   *** without nocturnal  or early am exacerbation  of respiratory  c/o's or need for noct saba. Also denies any obvious fluctuation of symptoms with weather or environmental changes or other aggravating or alleviating factors except as outlined above   No unusual exposure hx or h/o childhood pna/ asthma or knowledge of premature birth.  Current Allergies, Complete Past Medical History, Past Surgical History, Family History, and Social History were reviewed in [employer] record.  ROS  The following are not active complaints unless bolded Hoarseness, sore throat, dysphagia, dental problems, itching, sneezing,  nasal congestion or discharge of  excess mucus or purulent secretions, ear ache,   fever, chills, sweats, unintended wt loss or wt gain, classically pleuritic or exertional cp,  orthopnea pnd or arm/hand swelling  or leg swelling, presyncope, palpitations, abdominal pain, anorexia, nausea, vomiting, diarrhea  or change in bowel habits or change in bladder habits, change in stools or change in urine, dysuria, hematuria,  rash, arthralgias, visual complaints, headache, numbness, weakness or ataxia or problems with walking or coordination,  change in mood or  memory.        No outpatient medications have been marked as taking for the 12/23/20 encounter (Appointment) with Nyoka Cowden, MD.                     Objective:   Physical Exam    amb wf nad   12/23/2020         *** 07/08/2018      194  06/25/2018      193  06/03/2018        189  04/24/2018         185  04/25/2016         168   10/13/15 157 lb 6.4 oz (71.396 kg)  08/10/15 157 lb 6.4 oz (71.396 kg)  07/21/15 156 lb 6.4 oz (70.943 kg)      Vital signs reviewed  12/23/2020  - Note at rest 02 sats  ***% on ***   General appearance:    ***      Reports  Edentulous    R shoulder not manipulated as reported frozen***                  Assessment:

## 2022-03-12 ENCOUNTER — Encounter (HOSPITAL_COMMUNITY): Payer: Self-pay

## 2022-03-12 ENCOUNTER — Other Ambulatory Visit: Payer: Self-pay

## 2022-03-12 ENCOUNTER — Emergency Department (HOSPITAL_COMMUNITY): Payer: Medicaid Other

## 2022-03-12 ENCOUNTER — Emergency Department (HOSPITAL_COMMUNITY)
Admission: EM | Admit: 2022-03-12 | Discharge: 2022-03-13 | Disposition: A | Discharge status: Home / Self Care | Payer: Medicaid Other | Attending: Student | Admitting: Student

## 2022-03-12 DIAGNOSIS — I5032 Chronic diastolic (congestive) heart failure: Secondary | ICD-10-CM | POA: Diagnosis not present

## 2022-03-12 DIAGNOSIS — R42 Dizziness and giddiness: Secondary | ICD-10-CM | POA: Insufficient documentation

## 2022-03-12 DIAGNOSIS — W19XXXA Unspecified fall, initial encounter: Secondary | ICD-10-CM | POA: Insufficient documentation

## 2022-03-12 DIAGNOSIS — J45909 Unspecified asthma, uncomplicated: Secondary | ICD-10-CM | POA: Diagnosis not present

## 2022-03-12 DIAGNOSIS — R5381 Other malaise: Secondary | ICD-10-CM

## 2022-03-12 DIAGNOSIS — N182 Chronic kidney disease, stage 2 (mild): Secondary | ICD-10-CM | POA: Insufficient documentation

## 2022-03-12 DIAGNOSIS — J449 Chronic obstructive pulmonary disease, unspecified: Secondary | ICD-10-CM | POA: Diagnosis not present

## 2022-03-12 DIAGNOSIS — R531 Weakness: Secondary | ICD-10-CM | POA: Insufficient documentation

## 2022-03-12 DIAGNOSIS — R55 Syncope and collapse: Secondary | ICD-10-CM

## 2022-03-12 LAB — CBC WITH DIFFERENTIAL/PLATELET
Abs Immature Granulocytes: 0.06 10*3/uL (ref 0.00–0.07)
Basophils Absolute: 0 10*3/uL (ref 0.0–0.1)
Basophils Relative: 0 %
Eosinophils Absolute: 0.1 10*3/uL (ref 0.0–0.5)
Eosinophils Relative: 1 %
HCT: 41.3 % (ref 36.0–46.0)
Hemoglobin: 13.8 g/dL (ref 12.0–15.0)
Immature Granulocytes: 1 %
Lymphocytes Relative: 20 %
Lymphs Abs: 2.5 10*3/uL (ref 0.7–4.0)
MCH: 31.4 pg (ref 26.0–34.0)
MCHC: 33.4 g/dL (ref 30.0–36.0)
MCV: 94.1 fL (ref 80.0–100.0)
Monocytes Absolute: 0.9 10*3/uL (ref 0.1–1.0)
Monocytes Relative: 7 %
Neutro Abs: 8.9 10*3/uL — ABNORMAL HIGH (ref 1.7–7.7)
Neutrophils Relative %: 71 %
Platelets: 238 10*3/uL (ref 150–400)
RBC: 4.39 MIL/uL (ref 3.87–5.11)
RDW: 14.3 % (ref 11.5–15.5)
WBC: 12.4 10*3/uL — ABNORMAL HIGH (ref 4.0–10.5)
nRBC: 0 % (ref 0.0–0.2)

## 2022-03-12 LAB — URINALYSIS, ROUTINE W REFLEX MICROSCOPIC
Bacteria, UA: NONE SEEN
Bilirubin Urine: NEGATIVE
Glucose, UA: NEGATIVE mg/dL
Ketones, ur: NEGATIVE mg/dL
Nitrite: NEGATIVE
Protein, ur: NEGATIVE mg/dL
Specific Gravity, Urine: 1.023 (ref 1.005–1.030)
pH: 5 (ref 5.0–8.0)

## 2022-03-12 LAB — BLOOD GAS, VENOUS
Acid-Base Excess: 12 mmol/L — ABNORMAL HIGH (ref 0.0–2.0)
Bicarbonate: 37.3 mmol/L — ABNORMAL HIGH (ref 20.0–28.0)
O2 Saturation: 51.1 %
Patient temperature: 37
pCO2, Ven: 49 mmHg (ref 44–60)
pH, Ven: 7.49 — ABNORMAL HIGH (ref 7.25–7.43)
pO2, Ven: 34 mmHg (ref 32–45)

## 2022-03-12 LAB — COMPREHENSIVE METABOLIC PANEL
ALT: 17 U/L (ref 0–44)
AST: 24 U/L (ref 15–41)
Albumin: 3.4 g/dL — ABNORMAL LOW (ref 3.5–5.0)
Alkaline Phosphatase: 52 U/L (ref 38–126)
Anion gap: 9 (ref 5–15)
BUN: 20 mg/dL (ref 8–23)
CO2: 26 mmol/L (ref 22–32)
Calcium: 8.9 mg/dL (ref 8.9–10.3)
Chloride: 105 mmol/L (ref 98–111)
Creatinine, Ser: 0.88 mg/dL (ref 0.44–1.00)
GFR, Estimated: >60 mL/min (ref >60)
Glucose, Bld: 114 mg/dL — ABNORMAL HIGH (ref 70–99)
Potassium: 3.7 mmol/L (ref 3.5–5.1)
Sodium: 140 mmol/L (ref 135–145)
Total Bilirubin: 0.5 mg/dL (ref 0.3–1.2)
Total Protein: 6.8 g/dL (ref 6.5–8.1)

## 2022-03-12 LAB — TROPONIN I (HIGH SENSITIVITY): Troponin I (High Sensitivity): 2 ng/L (ref <18)

## 2022-03-12 MED ORDER — SODIUM CHLORIDE 0.9 % IV BOLUS
1000.0000 mL | Freq: Once | INTRAVENOUS | Status: AC
  Administered 2022-03-12: 1000 mL via INTRAVENOUS

## 2022-03-12 NOTE — ED Triage Notes (Addendum)
Patient BIB [facility]. Witnessed syncopal episode. LOC for 2 minutes. No injuries from this. Had a fall Friday and has been weak/dizzy ever since. Has COPD and wears 2.5L [place] at baseline. EMS gave solumedrol en route, was prescribed it by her primary care doctor on 8/8.

## 2022-03-12 NOTE — ED Provider Notes (Signed)
Provider Note MRN:  [id number]  Arrival date & time: 03/13/22    ED Course and Medical Decision Making  Assumed care from Kommer at shift change.  See note from prior team for complete details, in brief: .  66 yo f Hx copd on home o2 To ED w/ dizziness, fall few days ago w/ LOC Abnormal gait First trop neg, delta pending MRI brain/cspine pending Ambulate Neurologically intact Echo 12/19 wnl Apparently had hx of this in the past that required cervical decompressive surgery a/w C4/C5 cord compression, Dr Wynetta Emery 2016  MRI has resulted, foraminal narrowing noted. MRI brain w/o acute abnormalities but limited study Pt is feeling better, she is ambulatory w/ steady gait. Neurologically intact.  [place] syncope rule is LOW RISK for this patient Trop negx2, ecg without acute ischemic changes, CXR neg, no CP; low suspicion for ACS or cardiogenic syncope She has followed with cards in the past, dr Antoine Poche; has not seen in >12 mos, advised her to f/u with cardiology  Home health face to face ordered, [facility]   Patient presents with syncopal symptoms without worrisome features. Presentation most suggestive of neuro-cardiogenic or orthostatic cause. Very low suspicion for serious arrhythmia, cardiac ischemia or other serious etiology. ECG reviewed, no evidence of a cardiac arrhythmia such as [place], WPW, HOCM, IHSS, Long or short QT. Neurologic exam is nonfocal, not consistent with CVA or primary neurologic abnormality. Patient appears safe for discharge with outpatient observation and close PCP F/U. Syncope warnings discussed with patient. The patient has been instructed to return immediately if the symptoms worsen in any way. Patient verbalized understanding and is in agreement with current care plan. All questions answered prior to discharge.  Procedures  Final Clinical Impressions(s) / ED Diagnoses     ICD-10-CM   1. Syncope, unspecified syncope type  R55 Ambulatory  referral to Cardiology    2. Physical deconditioning  R53.81       ED Discharge Orders          Ordered    Ambulatory referral to Cardiology       Comments: [email or web address]   03/13/22 0246              Discharge Instructions      It was a pleasure caring for you today in the emergency department.  Please return to the emergency department for any worsening or worrisome symptoms.    Have someone stay with you until you feel stable. Do not drive, operate machinery, or play sports until your caregiver says it is okay. Keep all follow-up appointments as directed by your caregiver. Lie down right away if you start feeling like you might faint. Breathe deeply and steadily. Wait until all the symptoms have passed.Drink enough fluids to keep your urine clear or pale yellow. If you are taking blood pressure or heart medicine, get up slowly, taking several minutes to sit and then stand. This can reduce dizziness. SEEK IMMEDIATE MEDICAL CARE IF: You have a severe headache. You have unusual pain in the chest, abdomen, or back. You are bleeding from the mouth or rectum, or you have a black or tarry stool. You have an irregular or very fast heartbeat. You have pain with breathing. You have repeated fainting or seizure-like jerking during an episode. You faint when sitting or lying down. You have confusion. You have difficulty walking. You have severe weakness. You have vision problems. If you fainted, call your local emergency services - do not drive yourself to the  hospital.   Please return to the emergency department immediately for any new or concerning symptoms, or if you get worse.       Sloan Leiter, DO 03/13/22 [phone]

## 2022-03-13 LAB — TROPONIN I (HIGH SENSITIVITY): Troponin I (High Sensitivity): 2 ng/L (ref <18)

## 2022-03-13 NOTE — ED Provider Notes (Signed)
[facility] DEPT Provider Note  CSN: [id number] Arrival date & time: 03/12/22 2029  Chief Complaint(s) Syncope  HPI [name] is a 66 y.o. female with PMH asthma, CHF, COPD, previous cervical cord compression requiring decompressive therapy in 2016 who presents emergency department for evaluation of syncope.  Patient had a witnessed syncopal episode today where she lost consciousness for approximately 2 minutes and slumped over.  She had a fall 3 days ago and has been weak and dizzy.  She was recently prescribed prednisone by her primary care physician on 8 8 and the EMS gave Solu-Medrol on route but no breathing treatments (?).  Patient's primary concern is that she has been feeling intermittently more dizzy and states that this is not vertigo, but instead feels like she is unsteady on her feet and she feels like she has ataxia.  She states the last time this happened she had cervical stenosis that required surgery.  She currently denies chest pain, shortness of breath, abdominal pain, nausea, vomiting or other systemic symptoms.   Past Medical History Past Medical History:  Diagnosis Date   Arthritis    Asthma    Breo and Combivent daily   CHF (congestive heart failure) (HCC)    takes Lasix daily   Complication of anesthesia    slow to wake ; " everytime i have surgery my oxgen drops down below the 90s and it stays like that for sometime days after surgery "    COPD (chronic obstructive pulmonary disease) ([place])    Decreased spinal mobility    cervical     Depression    takes Wellbutrin and Citalopram daily   Dyspnea    ongoing    Emphysema lung (HCC)    GERD (gastroesophageal reflux disease)    takes Omeprazole daily   Glaucoma    mild"---unsure if correct , has heard that she has it and hear that she does not have it    History of blood transfusion 1983   no abnormal reaction noted   History of bronchitis 5-6wks ago   Joint swelling    Mood  disorder (HCC)    Multiple allergies    Fluticasone and Mucinex daily   On home oxygen therapy    2.5L prn and at bedtime   Peripheral neuropathy    Pneumonia under the age of 53   hx of   Vertigo    takes Meclizine daily as needed   Weakness    numbness and tingling in right arm and leg   Patient Active Problem List   Diagnosis Date Noted   S/P reverse total shoulder arthroplasty, right 08/14/2018   Preop cardiovascular exam 07/02/2018   Sinus tachycardia 07/02/2018   Chronic congestive heart failure with left ventricular diastolic dysfunction ([place]) 07/02/2018   Anxiety 06/10/2018   DOE (dyspnea on exertion) 04/24/2018   Atopy 01/26/2016   COPD GOLD II 10/17/2015   Myelopathy, spondylogenic, cervical 06/22/2015   Preoperative clearance 06/15/2015   Chronic diastolic congestive heart failure ([place]) 08/02/2013   Emphysema/COPD Gold B 07/31/2013   Sleep disorder/? 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 Authorizing Provider  acetaminophen (TYLENOL) 500 MG tablet Take 1,000 mg by mouth 2 (two) times daily.  Yes [provider]  ALPRAZolam (XANAX) 0.25 MG tablet Take 0.25 mg by mouth every 6 (six) hours as needed. 03/06/22  Yes [provider]  budesonide-formoterol (SYMBICORT) 160-4.5 MCG/ACT inhaler Inhale 2 puffs into the lungs 2 (two) times daily.   Yes [provider]  cetirizine (ZYRTEC) 10 MG tablet Take 10 mg by mouth at bedtime. 02/28/22  Yes [provider]  citalopram (CELEXA) 40 MG tablet Take 40 mg by mouth daily. 12/24/21  Yes [provider]  COMBIVENT RESPIMAT 20-100 MCG/ACT AERS respimat UP TO ONE PUFF EVERY 4 HOURS IF NEEDED Patient taking differently: Inhale 1 puff into the lungs every 4 (four) hours as  needed for wheezing. 08/31/19  Yes Nyoka Cowden, MD  doxycycline (VIBRA-TABS) 100 MG tablet Take 100 mg by mouth 2 (two) times daily. 03/06/22  Yes [provider]  furosemide (LASIX) 20 MG tablet Take 1 tablet (20 mg total) by mouth daily. 09/29/13  Yes Rollene Rotunda, MD  gabapentin (NEURONTIN) 300 MG capsule Take 300 mg by mouth 3 (three) times daily. 01/01/22  Yes [provider]  guaifenesin (HUMIBID E) 400 MG TABS tablet Take 400 mg by mouth 3 (three) times daily.    Yes [provider]  guaiFENesin-codeine 100-10 MG/5ML syrup Take 5 mLs by mouth every 12 (twelve) hours as needed for cough. 06/25/18  Yes Nyoka Cowden, MD  indomethacin (INDOCIN SR) 75 MG CR capsule Take 75 mg by mouth 2 (two) times daily. 02/04/15  Yes [provider]  meclizine (ANTIVERT) 25 MG tablet Take 25 mg by mouth every 6 (six) hours as needed. 11/27/21  Yes [provider]  methocarbamol (ROBAXIN) 750 MG tablet Take 1 tablet (750 mg total) by mouth every 4 (four) hours. 08/15/18  Yes Shuford, French Ana, PA-C  montelukast (SINGULAIR) 10 MG tablet Take 10 mg by mouth at bedtime.   Yes [provider]  omeprazole (PRILOSEC) 20 MG capsule Take 20 mg by mouth daily. 01/01/22  Yes [provider]  predniSONE (DELTASONE) 10 MG tablet Take by mouth. 6 tablets for 2 days 4 for 2 days 2 for 2 days 1 for 2 days 03/06/22  Yes [provider]  zolpidem (AMBIEN) 10 MG tablet Take 10 mg by mouth at bedtime as needed. 03/06/22  Yes [provider]  ondansetron (ZOFRAN) 4 MG tablet Take 1 tablet (4 mg total) by mouth every 8 (eight) hours as needed for nausea or vomiting. Patient not taking: Reported on 03/12/2022 08/15/18   Shuford, French Ana, PA-C  oxyCODONE-acetaminophen (PERCOCET) 5-325 MG tablet Take 1 tablet by mouth every 4 (four) hours as needed (max 6 q). Patient not taking: Reported on 03/12/2022 08/15/18   Shuford, French Ana, PA-C  OXYGEN 2.5 L at bedtime with sleep and  "when I am at home and I need it"    [provider]  pantoprazole (PROTONIX) 40 MG tablet Take 30- 60 min before your first and last meals of the day Patient not taking: Reported on 03/12/2022 07/08/18   Nyoka Cowden, MD  Past Surgical History Past Surgical History:  Procedure Laterality Date   ANTERIOR CERVICAL CORPECTOMY N/A 06/22/2015   Procedure: ANTERIOR CERVICAL DECOMPRESSION/DISCECTOMY FUSION C6 - C7 with corpectomy;  Surgeon: Donalee Citrin, MD;  Location: [facility];  Service: Neurosurgery;  Laterality: N/A;   BACK SURGERY     lumbar; i had 3 lower back surgeries    NECK SURGERY     POSTERIOR CERVICAL FUSION/FORAMINOTOMY  07/08/2015   Procedure: POSTERIOR CERVICAL FUSION/FORAMINOTOMY CERVICAL TWO TO CERVICAL SEVEN;  Surgeon: Donalee Citrin, MD;  Location: [facility];  Service: Neurosurgery;;   POSTERIOR FUSION CERVICAL SPINE  07/08/2015   REVERSE SHOULDER ARTHROPLASTY Right 08/14/2018   Procedure: REVERSE SHOULDER ARTHROPLASTY;  Surgeon: Francena Hanly, MD;  Location: [facility];  Service: Orthopedics;  Laterality: Right;    ROTATOR CUFF REPAIR     right   TONSILLECTOMY AND ADENOIDECTOMY     TUBAL LIGATION     Family History Family History  Problem Relation Age of Onset   COPD Mother    Heart disease Mother        Valve disease   Allergies Mother    Asthma Mother    CAD Father 56   Emphysema Paternal Grandfather    CAD Brother 61       Mild CAD    Social History Social History   Tobacco Use   Smoking status: Former    Packs/day: 1.00    Years: 15.00    Total pack years: 15.00    Types: Cigarettes    Quit date: 07/30/1997    Years since quitting: 24.6   Smokeless tobacco: Never  Vaping Use   Vaping Use: Never used  Substance Use Topics   Alcohol use: No    Alcohol/week: 0.0 standard drinks of alcohol   Drug use: No    Allergies Azithromycin  Review of Systems Review of Systems  Neurological:  Positive for syncope.    Physical Exam Vital Signs  I have reviewed the triage vital signs BP 137/78   Pulse 77   Temp 98 F (36.7 C) (Oral)   Resp 14   Ht 5\' 1"  (1.549 m)   Wt 82.1 kg   SpO2 96%   BMI 34.20 kg/m   Physical Exam Vitals and nursing note reviewed.  Constitutional:      General: She is not in acute distress.    Appearance: She is well-developed.  HENT:     Head: Normocephalic and atraumatic.  Eyes:     Conjunctiva/sclera: Conjunctivae normal.  Cardiovascular:     Rate and Rhythm: Normal rate and regular rhythm.     Heart sounds: No murmur heard. Pulmonary:     Effort: Pulmonary effort is normal. No respiratory distress.     Breath sounds: Normal breath sounds.  Abdominal:     Palpations: Abdomen is soft.     Tenderness: There is no abdominal tenderness.  Musculoskeletal:        General: No swelling.     Cervical back: Neck supple.  Skin:    General: Skin is warm and dry.     Capillary Refill: Capillary refill takes less than 2 seconds.  Neurological:     Mental Status: She is alert.     Cranial Nerves: No cranial nerve deficit.     Sensory: No sensory deficit.     Motor: No weakness.  Psychiatric:        Mood and Affect: Mood normal.     ED Results and Treatments  Labs (all labs ordered are listed, but only abnormal results are displayed) Labs Reviewed  COMPREHENSIVE METABOLIC PANEL - Abnormal; Notable for the following components:      Result Value   Glucose, Bld 114 (*)    Albumin 3.4 (*)    All other components within normal limits  CBC WITH DIFFERENTIAL/PLATELET - Abnormal; Notable for the following components:   WBC 12.4 (*)    Neutro Abs 8.9 (*)    All other components within normal limits  BLOOD GAS, VENOUS - Abnormal; Notable for the following components:   pH, Ven 7.49 (*)    Bicarbonate 37.3 (*)    Acid-Base Excess 12.0 (*)    All other  components within normal limits  URINALYSIS, ROUTINE W REFLEX MICROSCOPIC - Abnormal; Notable for the following components:   Hgb urine dipstick MODERATE (*)    Leukocytes,Ua MODERATE (*)    All other components within normal limits  TROPONIN I (HIGH SENSITIVITY)  TROPONIN I (HIGH SENSITIVITY)                                                                                                                          Radiology MR Cervical Spine Wo Contrast  Result Date: 03/12/2022 CLINICAL DATA:  Ataxia EXAM: MRI CERVICAL SPINE WITHOUT CONTRAST TECHNIQUE: Multiplanar, multisequence MR imaging of the cervical spine was performed. No intravenous contrast was administered. COMPARISON:  MRI cervical spine 05/19/2015 correlation is also made with CT cervical spine 11/15/2015 FINDINGS: Alignment: Straightening of the normal cervical lordosis. Mild anterolisthesis of C2 on C3, unchanged compared to 2017. Fused anterolisthesis of C3 on the residua of C4. Vertebrae: Status post anterior fusion C3-C7, with C4 and C5 corpectomy. Posterior fusion C2-C7. Redemonstrated protrusion of the superior posterior aspect of the C4 remainder. Artifact from hardware limits evaluation of these levels. Within this limitation, no acute fracture or suspicious osseous lesion. Cord: Normal in signal and morphology. Posterior Fossa, vertebral arteries, paraspinal tissues: No acute finding. Disc levels: C2-C3: Mild anterolisthesis with disc unroofing. Facet arthropathy. No spinal canal stenosis or neural foraminal narrowing. C3-C4: Status post fusion. No spinal canal stenosis. Mild right and moderate left neural foraminal narrowing. C4-C5: Status post fusion. No spinal canal stenosis. Mild right neural foraminal narrowing. C5-C6: Status post fusion. No spinal canal stenosis or neural foraminal narrowing. C6-C7: Status post fusion. No spinal canal stenosis. Mild left neural foraminal narrowing. C7-T1: Disc height loss with disc osteophyte  complex. Facet and uncovertebral hypertrophy. No spinal canal stenosis. Severe right neural foraminal narrowing. IMPRESSION: 1. Status post anterior fusion C3-C7 and posterior fusion C2-C7. No residual spinal canal stenosis at these levels. 2. C7-T1 severe right neural foraminal narrowing. 3. C3-C4 moderate left and mild right neural foraminal narrowing. 4. Additional mild neural foraminal narrowing on the right at C4-C5 and on the left C6-C7. Electronically Signed   By: Merilyn Baba M.D.   On: 03/12/2022 23:56   MR BRAIN WO CONTRAST  Result Date: 03/12/2022 CLINICAL DATA:  Dizziness EXAM: MRI HEAD WITHOUT CONTRAST TECHNIQUE: Multiplanar, multiecho pulse sequences of the brain and surrounding structures were obtained without intravenous contrast. COMPARISON:  10/25/2014 FINDINGS: Evaluation of the inferior cerebellum is limited by susceptibility artifact from the patient's cervical hardware, which affects several series. Brain: No restricted diffusion to suggest acute or subacute infarct. No acute hemorrhage, mass, mass effect, or midline shift. No hemosiderin deposition to suggest remote hemorrhage. No hydrocephalus or extra-axial collection. Confluent T2 hyperintense signal in the periventricular white matter and pons, likely the sequela of moderate chronic small vessel ischemic disease. Remote lacunar infarcts in the bilateral thalami, left basal ganglia, and left cerebellum. Vascular: Normal arterial flow voids. Skull and upper cervical spine: Normal marrow signal. Right parietal scalp defect. Sinuses/Orbits: No acute finding. Other: No acute finding. IMPRESSION: Evaluation of the inferior cerebellum is somewhat limited by susceptibility artifact from the patient's spinal hardware. Within this limitation, no acute intracranial process. No evidence of acute or subacute infarct. Electronically Signed   By: Merilyn Baba M.D.   On: 03/12/2022 23:44   DG Chest Portable 1 View  Result Date:  03/12/2022 CLINICAL DATA:  Chest congestion, possible pneumonia. EXAM: PORTABLE CHEST 1 VIEW COMPARISON:  06/25/2018. FINDINGS: The heart size and mediastinal contours are within normal limits. There is mild atherosclerotic calcification of the aorta. Lung volumes are low. No consolidation, effusion, or pneumothorax. Cervical spinal fusion hardware is noted. Right shoulder arthroplasty changes are present. No acute osseous abnormality. IMPRESSION: No active disease. Electronically Signed   By: Brett Fairy M.D.   On: 03/12/2022 23:30    Pertinent labs & imaging results that were available during my care of the patient were reviewed by me and considered in my medical decision making (see MDM for details).  Medications Ordered in ED Medications  sodium chloride 0.9 % bolus 1,000 mL (0 mLs Intravenous Stopped 03/13/22 0051)                                                                                                                                     Procedures Procedures  (including critical care time)  Medical Decision Making / ED Course   This patient presents to the ED for concern of syncope, ataxia, this involves an extensive number of treatment options, and is a complaint that carries with it a high risk of complications and morbidity.  The differential diagnosis includes orthostatic syncope, vasovagal syncope, cervical cord compression, cerebellar infarct, electrolyte abnormality, UTI, normal pressure hydrocephalus  MDM: Patient seen in the emergency department for evaluation of syncope.  Physical exam largely unremarkable with no appreciable wheezing on exam.  CBC with a leukocytosis to 12.4 likely elevated in setting of her steroid use, pH 7.49 with no hypercarbia, urinalysis with moderate leuk esterase, 6-10 red blood cells, 11-20 white blood cells and calcium oxalate crystals.  Given that patient had similar symptoms with her last need for cervical decompression, an MRI of  the head and  C-spine were obtained.  These results are currently pending at time of signout.  Please see provider signout for continuation of work-up.  Anticipate need for physical therapy in the setting of her ataxia.   Additional history obtained: -Additional history obtained from son -External records from outside source obtained and reviewed including: Chart review including previous notes, labs, imaging, consultation notes   Lab Tests: -I ordered, reviewed, and interpreted labs.   The pertinent results include:   Labs Reviewed  COMPREHENSIVE METABOLIC PANEL - Abnormal; Notable for the following components:      Result Value   Glucose, Bld 114 (*)    Albumin 3.4 (*)    All other components within normal limits  CBC WITH DIFFERENTIAL/PLATELET - Abnormal; Notable for the following components:   WBC 12.4 (*)    Neutro Abs 8.9 (*)    All other components within normal limits  BLOOD GAS, VENOUS - Abnormal; Notable for the following components:   pH, Ven 7.49 (*)    Bicarbonate 37.3 (*)    Acid-Base Excess 12.0 (*)    All other components within normal limits  URINALYSIS, ROUTINE W REFLEX MICROSCOPIC - Abnormal; Notable for the following components:   Hgb urine dipstick MODERATE (*)    Leukocytes,Ua MODERATE (*)    All other components within normal limits  TROPONIN I (HIGH SENSITIVITY)  TROPONIN I (HIGH SENSITIVITY)      EKG   EKG Interpretation  Date/Time:  Monday March 12 2022 20:41:40 EDT Ventricular Rate:  75 PR Interval:  155 QRS Duration: 85 QT Interval:  409 QTC Calculation: 457 R Axis:   56 Text Interpretation: Sinus rhythm Abnormal R-wave progression, early transition no stemi Confirmed by Wynona Dove ([id number]) on 03/13/2022 2:25:50 AM         Imaging Studies ordered: I ordered imaging studies including chest x-ray, MRI brain, C-spine I independently visualized and interpreted imaging. I agree with the radiologist interpretation   Medicines ordered and prescription  drug management: Meds ordered this encounter  Medications   sodium chloride 0.9 % bolus 1,000 mL    -I have reviewed the patients home medicines and have made adjustments as needed  Critical interventions none    Cardiac Monitoring: The patient was maintained on a cardiac monitor.  I personally viewed and interpreted the cardiac monitored which showed an underlying rhythm of: NSR  Social Determinants of Health:  Factors impacting patients care include: none   Reevaluation: After the interventions noted above, I reevaluated the patient and found that they have :stayed the same  Co morbidities that complicate the patient evaluation  Past Medical History:  Diagnosis Date   Arthritis    Asthma    Breo and Combivent daily   CHF (congestive heart failure) (HCC)    takes Lasix daily   Complication of anesthesia    slow to wake ; " everytime i have surgery my oxgen drops down below the 90s and it stays like that for sometime days after surgery "    COPD (chronic obstructive pulmonary disease) (HCC)    Decreased spinal mobility    cervical     Depression    takes Wellbutrin and Citalopram daily   Dyspnea    ongoing    Emphysema lung (HCC)    GERD (gastroesophageal reflux disease)    takes Omeprazole daily   Glaucoma    mild"---unsure if correct , has heard that she has it and hear that she does not have it  History of blood transfusion 1983   no abnormal reaction noted   History of bronchitis 5-6wks ago   Joint swelling    Mood disorder (HCC)    Multiple allergies    Fluticasone and Mucinex daily   On home oxygen therapy    2.5L prn and at bedtime   Peripheral neuropathy    Pneumonia under the age of 80   hx of   Vertigo    takes Meclizine daily as needed   Weakness    numbness and tingling in right arm and leg      Dispostion: I considered admission for this patient, and disposition is pending MRI imaging and completion of work-up by oncoming ED provider.   Please see provider signout note for continuation of work-up.     Final Clinical Impression(s) / ED Diagnoses Final diagnoses:  None     @PCDICTATION @    Teressa Lower, MD 03/13/22 [phone]

## 2022-03-13 NOTE — Discharge Instructions (Addendum)
It was a pleasure caring for you today in the emergency department.  Please return to the emergency department for any worsening or worrisome symptoms.  Have someone stay with you until you feel stable. Do not drive, operate machinery, or play sports until your caregiver says it is okay. Keep all follow-up appointments as directed by your caregiver. Lie down right away if you start feeling like you might faint. Breathe deeply and steadily. Wait until all the symptoms have passed.Drink enough fluids to keep your urine clear or pale yellow. If you are taking blood pressure or heart medicine, get up slowly, taking several minutes to sit and then stand. This can reduce dizziness. SEEK IMMEDIATE MEDICAL CARE IF: You have a severe headache. You have unusual pain in the chest, abdomen, or back. You are bleeding from the mouth or rectum, or you have a black or tarry stool. You have an irregular or very fast heartbeat. You have pain with breathing. You have repeated fainting or seizure-like jerking during an episode. You faint when sitting or lying down. You have confusion. You have difficulty walking. You have severe weakness. You have vision problems. If you fainted, call your local emergency services - do not drive yourself to the hospital.   Please return to the emergency department immediately for any new or concerning symptoms, or if you get worse.  

## 2022-03-13 NOTE — ED Notes (Signed)
Patient ambulated with RN assistance. Patient stated she felt better with more energy walking and did not shake as much.

## 2023-03-18 ENCOUNTER — Encounter (HOSPITAL_BASED_OUTPATIENT_CLINIC_OR_DEPARTMENT_OTHER): Payer: Self-pay

## 2023-03-18 ENCOUNTER — Emergency Department (HOSPITAL_BASED_OUTPATIENT_CLINIC_OR_DEPARTMENT_OTHER): Payer: Medicaid Other

## 2023-03-18 ENCOUNTER — Other Ambulatory Visit: Payer: Self-pay

## 2023-03-18 ENCOUNTER — Emergency Department (HOSPITAL_BASED_OUTPATIENT_CLINIC_OR_DEPARTMENT_OTHER)
Admission: EM | Admit: 2023-03-18 | Discharge: 2023-03-18 | Disposition: A | Discharge status: Home / Self Care | Payer: Medicaid Other | Attending: Emergency Medicine | Admitting: Emergency Medicine

## 2023-03-18 DIAGNOSIS — M545 Low back pain, unspecified: Secondary | ICD-10-CM | POA: Diagnosis not present

## 2023-03-18 DIAGNOSIS — S301XXA Contusion of abdominal wall, initial encounter: Secondary | ICD-10-CM | POA: Diagnosis not present

## 2023-03-18 DIAGNOSIS — W19XXXA Unspecified fall, initial encounter: Secondary | ICD-10-CM

## 2023-03-18 DIAGNOSIS — S0003XA Contusion of scalp, initial encounter: Secondary | ICD-10-CM

## 2023-03-18 DIAGNOSIS — S0990XA Unspecified injury of head, initial encounter: Secondary | ICD-10-CM | POA: Diagnosis not present

## 2023-03-18 DIAGNOSIS — W01198A Fall on same level from slipping, tripping and stumbling with subsequent striking against other object, initial encounter: Secondary | ICD-10-CM | POA: Diagnosis not present

## 2023-03-18 DIAGNOSIS — S3011XA Contusion of abdominal wall, initial encounter: Secondary | ICD-10-CM

## 2023-03-18 DIAGNOSIS — T07XXXA Unspecified multiple injuries, initial encounter: Secondary | ICD-10-CM

## 2023-03-18 LAB — COMPREHENSIVE METABOLIC PANEL
ALT: 23 U/L (ref 0–44)
AST: 28 U/L (ref 15–41)
Albumin: 3.4 g/dL — ABNORMAL LOW (ref 3.5–5.0)
Alkaline Phosphatase: 61 U/L (ref 38–126)
Anion gap: 8 (ref 5–15)
BUN: 20 mg/dL (ref 8–23)
CO2: 27 mmol/L (ref 22–32)
Calcium: 8.7 mg/dL — ABNORMAL LOW (ref 8.9–10.3)
Chloride: 101 mmol/L (ref 98–111)
Creatinine, Ser: 0.72 mg/dL (ref 0.44–1.00)
GFR, Estimated: >60 mL/min (ref >60)
Glucose, Bld: 96 mg/dL (ref 70–99)
Potassium: 3.2 mmol/L — ABNORMAL LOW (ref 3.5–5.1)
Sodium: 136 mmol/L (ref 135–145)
Total Bilirubin: 0.6 mg/dL (ref 0.3–1.2)
Total Protein: 6.7 g/dL (ref 6.5–8.1)

## 2023-03-18 LAB — CBC WITH DIFFERENTIAL/PLATELET
Abs Immature Granulocytes: 0.02 10*3/uL (ref 0.00–0.07)
Basophils Absolute: 0.1 10*3/uL (ref 0.0–0.1)
Basophils Relative: 1 %
Eosinophils Absolute: 0.5 10*3/uL (ref 0.0–0.5)
Eosinophils Relative: 5 %
HCT: 38 % (ref 36.0–46.0)
Hemoglobin: 12.6 g/dL (ref 12.0–15.0)
Immature Granulocytes: 0 %
Lymphocytes Relative: 38 %
Lymphs Abs: 3.5 10*3/uL (ref 0.7–4.0)
MCH: 30.3 pg (ref 26.0–34.0)
MCHC: 33.2 g/dL (ref 30.0–36.0)
MCV: 91.3 fL (ref 80.0–100.0)
Monocytes Absolute: 0.8 10*3/uL (ref 0.1–1.0)
Monocytes Relative: 8 %
Neutro Abs: 4.6 10*3/uL (ref 1.7–7.7)
Neutrophils Relative %: 48 %
Platelets: 217 10*3/uL (ref 150–400)
RBC: 4.16 MIL/uL (ref 3.87–5.11)
RDW: 13.2 % (ref 11.5–15.5)
WBC: 9.4 10*3/uL (ref 4.0–10.5)
nRBC: 0 % (ref 0.0–0.2)

## 2023-03-18 LAB — CK: Total CK: 462 U/L — ABNORMAL HIGH (ref 38–234)

## 2023-03-18 MED ORDER — SODIUM CHLORIDE 0.9 % IV BOLUS
500.0000 mL | Freq: Once | INTRAVENOUS | Status: AC
  Administered 2023-03-18: 500 mL via INTRAVENOUS

## 2023-03-18 MED ORDER — MORPHINE SULFATE (PF) 2 MG/ML IV SOLN
2.0000 mg | Freq: Once | INTRAVENOUS | Status: AC
  Administered 2023-03-18: 2 mg via INTRAVENOUS
  Filled 2023-03-18: qty 1

## 2023-03-18 MED ORDER — OXYCODONE-ACETAMINOPHEN 5-325 MG PO TABS
1.0000 | ORAL_TABLET | Freq: Four times a day (QID) | ORAL | 0 refills | Status: DC | PRN
Start: 2023-03-18 — End: 2023-10-21

## 2023-03-18 MED ORDER — ONDANSETRON HCL 4 MG/2ML IJ SOLN
4.0000 mg | Freq: Once | INTRAMUSCULAR | Status: AC
  Administered 2023-03-18: 4 mg via INTRAVENOUS
  Filled 2023-03-18: qty 2

## 2023-03-18 MED ORDER — HYDROCODONE-ACETAMINOPHEN 5-325 MG PO TABS: 1.0000 | ORAL_TABLET | Freq: Four times a day (QID) | ORAL | 0 refills | Status: DC | PRN

## 2023-03-18 MED ORDER — IOHEXOL 300 MG/ML  SOLN
100.0000 mL | Freq: Once | INTRAMUSCULAR | Status: AC | PRN
  Administered 2023-03-18: 100 mL via INTRAVENOUS

## 2023-03-18 NOTE — Progress Notes (Signed)
RT assessed in triage. Patient wears home O2 at 3L and came in on her concentrator. SAT 97%, no distress noted. Placed on our tank.

## 2023-03-18 NOTE — Discharge Instructions (Addendum)
It was a pleasure taking care of you here in the emergency department  Your CT scans did not show any evidence of broken bones.  You have many abrasions.  Would recommend putting over-the-counter antibiotic ointment such as bacitracin or Neosporin on these.  I have written you for a short course of pain medicine.  Take as prescribed.  Do not drive or operate heavy machinery while taking the medication  Have given you an incentive spirometer.  Recommend using this every 1-2 hours while awake, deep breathing 4-5 times to prevent pneumonia  Follow-up with primary care provider  Return for new or worsening symptoms

## 2023-03-18 NOTE — ED Triage Notes (Addendum)
Pt accompanied by family. [name] occurred yesterday Pt reports she became off balance while trying to get into tub.Larey Seat forward and hit forehead. NO loss of consciousness and was unable to get self up from tub for 3 hrs. Pt reports the was leaning over in tub and has pain abd, ribs and pelvic pain. Left elbow pain and skin tears to both elbows

## 2023-03-18 NOTE — ED Provider Notes (Signed)
[place] [facility] AT [facility] Provider Note   CSN: [id number] Arrival date & time: 03/18/23  1313     History  Chief Complaint  Patient presents with   [name] is a 67 y.o. female history of chronic hypoxic respiratory failure on 3 L via nasal cannula here for evaluation of fall.  Had mechanical fall yesterday morning.  Took a few hours to get off the floor.  She has been ambulatory today.  She fell forward and hit her head.  She denies any LOC.  She has pain to her head, right lower ribs, abdomen, pelvic region.  She is multiple skin tears and abrasions.  Her last tetanus is less than 11 years old.  She denies any increase in shortness of breath, back pain, bowel or bladder incontinence  HPI     Home Medications Prior to Admission medications   Medication Sig Start Date End Date Taking? Authorizing Provider  ALPRAZolam (XANAX) 0.25 MG tablet Take 0.25 mg by mouth every 6 (six) hours as needed. 03/06/22  Yes [provider]  citalopram (CELEXA) 40 MG tablet Take 40 mg by mouth daily. 12/24/21  Yes [provider]  oxyCODONE-acetaminophen (PERCOCET/ROXICET) 5-325 MG tablet Take 1 tablet by mouth every 6 (six) hours as needed for severe pain. 03/18/23  Yes Ashante Snelling A, PA-C  acetaminophen (TYLENOL) 500 MG tablet Take 1,000 mg by mouth 2 (two) times daily.    [provider]  budesonide-formoterol (SYMBICORT) 160-4.5 MCG/ACT inhaler Inhale 2 puffs into the lungs 2 (two) times daily.    [provider]  cetirizine (ZYRTEC) 10 MG tablet Take 10 mg by mouth at bedtime. 02/28/22   [provider]  COMBIVENT RESPIMAT 20-100 MCG/ACT AERS respimat UP TO ONE PUFF EVERY 4 HOURS IF NEEDED Patient taking differently: Inhale 1 puff into the lungs every 4 (four) hours as needed for wheezing. 08/31/19   Nyoka Cowden, MD  doxycycline (VIBRA-TABS) 100 MG tablet Take 100 mg by mouth 2 (two) times daily. 03/06/22    [provider]  furosemide (LASIX) 20 MG tablet Take 1 tablet (20 mg total) by mouth daily. 09/29/13   Rollene Rotunda, MD  gabapentin (NEURONTIN) 300 MG capsule Take 300 mg by mouth 3 (three) times daily. 01/01/22   [provider]  guaifenesin (HUMIBID E) 400 MG TABS tablet Take 400 mg by mouth 3 (three) times daily.     [provider]  indomethacin (INDOCIN SR) 75 MG CR capsule Take 75 mg by mouth 2 (two) times daily. 02/04/15   [provider]  meclizine (ANTIVERT) 25 MG tablet Take 25 mg by mouth every 6 (six) hours as needed. 11/27/21   [provider]  methocarbamol (ROBAXIN) 750 MG tablet Take 1 tablet (750 mg total) by mouth every 4 (four) hours. 08/15/18   Shuford, French Ana, PA-C  montelukast (SINGULAIR) 10 MG tablet Take 10 mg by mouth at bedtime.    [provider]  omeprazole (PRILOSEC) 20 MG capsule Take 20 mg by mouth daily. 01/01/22   [provider]  OXYGEN 2.5 L at bedtime with sleep and "when I am at home and I need it"    [provider]  predniSONE (DELTASONE) 10 MG tablet Take by mouth. 6 tablets for 2 days 4 for 2 days 2 for 2 days 1 for 2 days 03/06/22   [provider]  zolpidem (AMBIEN) 10 MG tablet Take 10 mg by mouth  at bedtime as needed. 03/06/22   [provider]      Allergies    Azithromycin    Review of Systems   Review of Systems  Constitutional: Negative.   HENT: Negative.    Respiratory: Negative.    Cardiovascular:  Positive for chest pain (rib pain on right).  Gastrointestinal:  Positive for abdominal pain. Negative for abdominal distention, anal bleeding, blood in stool, constipation, diarrhea, nausea, rectal pain and vomiting.  Genitourinary: Negative.   Musculoskeletal:        Upper extremity pain, back pain  Skin:  Positive for wound.  Neurological: Negative.   All other systems reviewed and are negative.   Physical Exam Updated Vital Signs BP 102/66   Pulse 95    Temp (!) 97.5 F (36.4 C)   Resp 18   Ht 5\' 2"  (1.575 m)   Wt 77.6 kg   SpO2 94%   BMI 31.28 kg/m  Physical Exam Vitals and nursing note reviewed.  Constitutional:      General: She is not in acute distress.    Appearance: She is well-developed. She is not toxic-appearing or diaphoretic. Ill appearance: chronically ill appearing. HENT:     Head: Normocephalic.     Comments: Bruising central forehead.  No raccoon eye, Battle sign, no facial crepitus    Nose: Nose normal. No congestion or rhinorrhea.     Mouth/Throat:     Mouth: Mucous membranes are moist.     Comments: No loose dentition, wears dentures, no drooling, dysphagia or trismus Eyes:     Pupils: Pupils are equal, round, and reactive to light.  Neck:     Comments: Mild tenderness bilateral paraspinal region cervical spine however full range of motion without difficulty Cardiovascular:     Rate and Rhythm: Normal rate.     Pulses: Normal pulses.     Heart sounds: Normal heart sounds.  Pulmonary:     Effort: Pulmonary effort is normal. No respiratory distress.     Breath sounds: Normal breath sounds.     Comments: Clear bilaterally, 3 L via nasal cannula at baseline Abdominal:     General: Bowel sounds are normal. There is no distension.     Palpations: Abdomen is soft.     Tenderness: There is abdominal tenderness. There is no right CVA tenderness, left CVA tenderness or guarding. Negative signs include Murphy's sign, McBurney's sign and psoas sign.     Hernia: No hernia is present.       Comments: Soft, diffuse tenderness to right upper abdomen with overlying ecchymosis.  No rebound or guarding.  Ecchymosis mons pubis, no crepitus.  Musculoskeletal:        General: Normal range of motion.     Cervical back: Normal range of motion.     Comments: Diffuse tenderness to back.  This bilateral legs off bed without difficulty.  Flexes and extends at hips and knees without difficulty.  Mild tenderness mid/proximal femur  bilaterally.  Pelvis stable, mild tenderness.  Diffuse tenderness bilateral forearm, left shoulder.  Skin:    General: Skin is warm and dry.     Capillary Refill: Capillary refill takes less than 2 seconds.     Findings: Abrasion, bruising, ecchymosis, signs of injury and wound present.     Comments: Multiple skin tears, contusions, abrasions.  No lacerations to suture  Neurological:     General: No focal deficit present.     Mental Status: She is alert.     Cranial  Nerves: Cranial nerves 2-12 are intact.     Sensory: Sensation is intact.     Motor: Motor function is intact.     Comments: Cranial nerves II through XII grossly intact Equal strength bilaterally  Psychiatric:        Mood and Affect: Mood normal.     ED Results / Procedures / Treatments   Labs (all labs ordered are listed, but only abnormal results are displayed) Labs Reviewed  COMPREHENSIVE METABOLIC PANEL - Abnormal; Notable for the following components:      Result Value   Potassium 3.2 (*)    Calcium 8.7 (*)    Albumin 3.4 (*)    All other components within normal limits  CK - Abnormal; Notable for the following components:   Total CK 462 (*)    All other components within normal limits  CBC WITH DIFFERENTIAL/PLATELET    EKG EKG Interpretation Date/Time:  Monday March 18 2023 13:26:54 EDT Ventricular Rate:  102 PR Interval:  152 QRS Duration:  94 QT Interval:  535 QTC Calculation: 698 R Axis:   65  Text Interpretation: Sinus tachycardia Abnormal R-wave progression, early transition Prolonged QT interval No significant change since last tracing Confirmed by Melene Plan [phone]) on 03/18/2023 4:14:29 PM  Radiology CT CHEST ABDOMEN PELVIS W CONTRAST  Result Date: 03/18/2023 CLINICAL DATA:  Polytrauma, blunt EXAM: CT CHEST, ABDOMEN, AND PELVIS WITH CONTRAST TECHNIQUE: Multidetector CT imaging of the chest, abdomen and pelvis was performed following the standard protocol during bolus administration of  intravenous contrast. RADIATION DOSE REDUCTION: This exam was performed according to the departmental dose-optimization program which includes automated exposure control, adjustment of the mA and/or kV according to patient size and/or use of iterative reconstruction technique. CONTRAST:  OMNIPAQUE IOHEXOL 300 MG/ML  SOLN COMPARISON:  None Available. FINDINGS: CHEST: Cardiovascular: No aortic injury. The thoracic aorta is normal in caliber. The heart is normal in size. No significant pericardial effusion. At least 3 vessel coronary artery calcification. Moderate atherosclerotic plaque of the aorta. Mediastinum/Nodes: No pneumomediastinum. No mediastinal hematoma. The esophagus is unremarkable. The thyroid is unremarkable. The central airways are patent. No mediastinal, hilar, or axillary lymphadenopathy. Lungs/Pleura: No focal consolidation. No pulmonary nodule. No pulmonary mass. No pulmonary contusion or laceration. No pneumatocele formation. No pleural effusion. No pneumothorax. No hemothorax. Musculoskeletal/Chest wall: No chest wall mass. No acute rib or sternal fracture. Reverse total right shoulder arthroplasty. Please see separately dictated CT thoracolumbar spine. ABDOMEN / PELVIS: Hepatobiliary: Not enlarged. No focal lesion. No laceration or subcapsular hematoma. The gallbladder is otherwise unremarkable with no radio-opaque gallstones. No biliary ductal dilatation. Pancreas: Normal pancreatic contour. No main pancreatic duct dilatation. Spleen: Not enlarged. No focal lesion. No laceration, subcapsular hematoma, or vascular injury. Adrenals/Urinary Tract: No nodularity bilaterally. Bilateral kidneys enhance symmetrically. No hydronephrosis. No contusion, laceration, or subcapsular hematoma. No injury to the vascular structures or collecting systems. No hydroureter. The urinary bladder is unremarkable. Stomach/Bowel: No small or large bowel wall thickening or dilatation. Few scattered colonic  diverticulosis. The appendix is not definitely identified with no inflammatory changes in the right lower quadrant to suggest acute appendicitis. Vasculature/Lymphatics: Severe atherosclerotic plaque. No abdominal aorta or iliac aneurysm. No active contrast extravasation or pseudoaneurysm. No abdominal, pelvic, inguinal lymphadenopathy. Reproductive: Uterus and bilateral adnexal regions are unremarkable. Other: No simple free fluid ascites. No pneumoperitoneum. No hemoperitoneum. No mesenteric hematoma identified. No organized fluid collection. Musculoskeletal: No significant soft tissue hematoma. Bilateral gluteal soft tissue injection granulomas. No acute pelvic fracture.  Please see separately dictated CT thoracolumbar spine. Ports and Devices: None. IMPRESSION: 1. No acute intrathoracic, intra-abdominal, intrapelvic traumatic injury. 2. Please see separately dictated CT thoracolumbar spine 03/18/2023. 3. Other imaging findings of potential clinical significance: Colonic diverticulosis with no acute diverticulitis. Aortic Atherosclerosis (ICD10-I70.0) including three-vessel coronary calcification. Electronically Signed   By: Tish Frederickson M.D.   On: 03/18/2023 18:11   CT T-SPINE NO CHARGE  Result Date: 03/18/2023 CLINICAL DATA:  Fall EXAM: CT THORACIC AND LUMBAR SPINE WITHOUT CONTRAST TECHNIQUE: Multidetector CT imaging of the thoracic and lumbar spine was performed without contrast. Multiplanar CT image reconstructions were also generated. RADIATION DOSE REDUCTION: This exam was performed according to the departmental dose-optimization program which includes automated exposure control, adjustment of the mA and/or kV according to patient size and/or use of iterative reconstruction technique. COMPARISON:  None Available. FINDINGS: CT THORACIC SPINE FINDINGS Alignment: Normal. Vertebrae: No acute fracture or focal pathologic process. Paraspinal and other soft tissues: Negative. Disc levels: Maintained. CT  LUMBAR SPINE FINDINGS Segmentation: 5 lumbar type vertebrae. Alignment: Grade 1 anterolisthesis of L5 on S1. Vertebrae: L4-L5 posterolateral surgical hardware fusion. Associated laminectomy. No CT evidence of surgical hardware complication. No acute fracture or focal pathologic process. Paraspinal and other soft tissues: Negative. Disc levels: Intervertebral disc space vacuum phenomenon at the L5-S1 level. IMPRESSION: CT THORACIC SPINE IMPRESSION No acute displaced fracture or traumatic listhesis of the thoracic spine. CT LUMBAR SPINE IMPRESSION No acute displaced fracture or traumatic listhesis of the lumbar spine. Electronically Signed   By: Tish Frederickson M.D.   On: 03/18/2023 17:38   CT L-SPINE NO CHARGE  Result Date: 03/18/2023 CLINICAL DATA:  Fall EXAM: CT THORACIC AND LUMBAR SPINE WITHOUT CONTRAST TECHNIQUE: Multidetector CT imaging of the thoracic and lumbar spine was performed without contrast. Multiplanar CT image reconstructions were also generated. RADIATION DOSE REDUCTION: This exam was performed according to the departmental dose-optimization program which includes automated exposure control, adjustment of the mA and/or kV according to patient size and/or use of iterative reconstruction technique. COMPARISON:  None Available. FINDINGS: CT THORACIC SPINE FINDINGS Alignment: Normal. Vertebrae: No acute fracture or focal pathologic process. Paraspinal and other soft tissues: Negative. Disc levels: Maintained. CT LUMBAR SPINE FINDINGS Segmentation: 5 lumbar type vertebrae. Alignment: Grade 1 anterolisthesis of L5 on S1. Vertebrae: L4-L5 posterolateral surgical hardware fusion. Associated laminectomy. No CT evidence of surgical hardware complication. No acute fracture or focal pathologic process. Paraspinal and other soft tissues: Negative. Disc levels: Intervertebral disc space vacuum phenomenon at the L5-S1 level. IMPRESSION: CT THORACIC SPINE IMPRESSION No acute displaced fracture or traumatic  listhesis of the thoracic spine. CT LUMBAR SPINE IMPRESSION No acute displaced fracture or traumatic listhesis of the lumbar spine. Electronically Signed   By: Tish Frederickson M.D.   On: 03/18/2023 17:38   CT Head Wo Contrast  Result Date: 03/18/2023 CLINICAL DATA:  Facial trauma, blunt; Neck trauma (Age >= 65y) Fell forward and hit forehead. NO loss of consciousness and was unable to get self up from tub for 3 hrs. EXAM: CT HEAD WITHOUT CONTRAST CT MAXILLOFACIAL WITHOUT CONTRAST CT CERVICAL SPINE WITHOUT CONTRAST TECHNIQUE: Multidetector CT imaging of the head, cervical spine, and maxillofacial structures were performed using the standard protocol without intravenous contrast. Multiplanar CT image reconstructions of the cervical spine and maxillofacial structures were also generated. RADIATION DOSE REDUCTION: This exam was performed according to the departmental dose-optimization program which includes automated exposure control, adjustment of the mA and/or kV according to patient size and/or use of  iterative reconstruction technique. COMPARISON:  None Available. FINDINGS: CT HEAD FINDINGS Brain: Cerebral ventricle sizes are concordant with the degree of cerebral volume loss. Patchy and confluent areas of decreased attenuation are noted throughout the deep and periventricular white matter of the cerebral hemispheres bilaterally, compatible with chronic microvascular ischemic disease. No evidence of large-territorial acute infarction. No parenchymal hemorrhage. No mass lesion. No extra-axial collection. No mass effect or midline shift. No hydrocephalus. Basilar cisterns are patent. Vascular: No hyperdense vessel. Atherosclerotic calcifications are present within the cavernous internal carotid arteries. Skull: No acute fracture or focal lesion. Other: None. CT MAXILLOFACIAL FINDINGS Osseous: No fracture or mandibular dislocation. No destructive process. Patient is edentulous. Sinuses/Orbits: Paranasal sinuses and  mastoid air cells are clear. Bilateral lens replacement. Other the orbits are unremarkable. Soft tissues: Negative. CT CERVICAL SPINE FINDINGS Alignment: Grade 1 anterolisthesis of C2 on C3. Skull base and vertebrae: C2-T1 posterolateral surgical hardware fusion as well as C3-C7 anterior cervical discectomy and fusion. No CT findings to suggest surgical hardware complication. Limited evaluation at the surgical levels due to streak artifact with likely severe osseous neural foraminal stenosis at the C3-C4 level. No acute fracture. No aggressive appearing focal osseous lesion or focal pathologic process. Soft tissues and spinal canal: No prevertebral fluid or swelling. No visible canal hematoma. Upper chest: Unremarkable. Other: None. IMPRESSION: 1. No acute intracranial abnormality. 2.  No acute displaced facial fracture. 3. No acute displaced fracture or traumatic listhesis of the cervical spine. 4. Limited evaluation at the surgical levels due to streak artifact with likely severe osseous neural foraminal stenosis at the C3-C4 level. Electronically Signed   By: Tish Frederickson M.D.   On: 03/18/2023 17:25   CT Maxillofacial Wo Contrast  Result Date: 03/18/2023 CLINICAL DATA:  Facial trauma, blunt; Neck trauma (Age >= 65y) Fell forward and hit forehead. NO loss of consciousness and was unable to get self up from tub for 3 hrs. EXAM: CT HEAD WITHOUT CONTRAST CT MAXILLOFACIAL WITHOUT CONTRAST CT CERVICAL SPINE WITHOUT CONTRAST TECHNIQUE: Multidetector CT imaging of the head, cervical spine, and maxillofacial structures were performed using the standard protocol without intravenous contrast. Multiplanar CT image reconstructions of the cervical spine and maxillofacial structures were also generated. RADIATION DOSE REDUCTION: This exam was performed according to the departmental dose-optimization program which includes automated exposure control, adjustment of the mA and/or kV according to patient size and/or use of  iterative reconstruction technique. COMPARISON:  None Available. FINDINGS: CT HEAD FINDINGS Brain: Cerebral ventricle sizes are concordant with the degree of cerebral volume loss. Patchy and confluent areas of decreased attenuation are noted throughout the deep and periventricular white matter of the cerebral hemispheres bilaterally, compatible with chronic microvascular ischemic disease. No evidence of large-territorial acute infarction. No parenchymal hemorrhage. No mass lesion. No extra-axial collection. No mass effect or midline shift. No hydrocephalus. Basilar cisterns are patent. Vascular: No hyperdense vessel. Atherosclerotic calcifications are present within the cavernous internal carotid arteries. Skull: No acute fracture or focal lesion. Other: None. CT MAXILLOFACIAL FINDINGS Osseous: No fracture or mandibular dislocation. No destructive process. Patient is edentulous. Sinuses/Orbits: Paranasal sinuses and mastoid air cells are clear. Bilateral lens replacement. Other the orbits are unremarkable. Soft tissues: Negative. CT CERVICAL SPINE FINDINGS Alignment: Grade 1 anterolisthesis of C2 on C3. Skull base and vertebrae: C2-T1 posterolateral surgical hardware fusion as well as C3-C7 anterior cervical discectomy and fusion. No CT findings to suggest surgical hardware complication. Limited evaluation at the surgical levels due to streak artifact with likely severe osseous  neural foraminal stenosis at the C3-C4 level. No acute fracture. No aggressive appearing focal osseous lesion or focal pathologic process. Soft tissues and spinal canal: No prevertebral fluid or swelling. No visible canal hematoma. Upper chest: Unremarkable. Other: None. IMPRESSION: 1. No acute intracranial abnormality. 2.  No acute displaced facial fracture. 3. No acute displaced fracture or traumatic listhesis of the cervical spine. 4. Limited evaluation at the surgical levels due to streak artifact with likely severe osseous neural  foraminal stenosis at the C3-C4 level. Electronically Signed   By: Tish Frederickson M.D.   On: 03/18/2023 17:25   CT Cervical Spine Wo Contrast  Result Date: 03/18/2023 CLINICAL DATA:  Facial trauma, blunt; Neck trauma (Age >= 65y) Fell forward and hit forehead. NO loss of consciousness and was unable to get self up from tub for 3 hrs. EXAM: CT HEAD WITHOUT CONTRAST CT MAXILLOFACIAL WITHOUT CONTRAST CT CERVICAL SPINE WITHOUT CONTRAST TECHNIQUE: Multidetector CT imaging of the head, cervical spine, and maxillofacial structures were performed using the standard protocol without intravenous contrast. Multiplanar CT image reconstructions of the cervical spine and maxillofacial structures were also generated. RADIATION DOSE REDUCTION: This exam was performed according to the departmental dose-optimization program which includes automated exposure control, adjustment of the mA and/or kV according to patient size and/or use of iterative reconstruction technique. COMPARISON:  None Available. FINDINGS: CT HEAD FINDINGS Brain: Cerebral ventricle sizes are concordant with the degree of cerebral volume loss. Patchy and confluent areas of decreased attenuation are noted throughout the deep and periventricular white matter of the cerebral hemispheres bilaterally, compatible with chronic microvascular ischemic disease. No evidence of large-territorial acute infarction. No parenchymal hemorrhage. No mass lesion. No extra-axial collection. No mass effect or midline shift. No hydrocephalus. Basilar cisterns are patent. Vascular: No hyperdense vessel. Atherosclerotic calcifications are present within the cavernous internal carotid arteries. Skull: No acute fracture or focal lesion. Other: None. CT MAXILLOFACIAL FINDINGS Osseous: No fracture or mandibular dislocation. No destructive process. Patient is edentulous. Sinuses/Orbits: Paranasal sinuses and mastoid air cells are clear. Bilateral lens replacement. Other the orbits are  unremarkable. Soft tissues: Negative. CT CERVICAL SPINE FINDINGS Alignment: Grade 1 anterolisthesis of C2 on C3. Skull base and vertebrae: C2-T1 posterolateral surgical hardware fusion as well as C3-C7 anterior cervical discectomy and fusion. No CT findings to suggest surgical hardware complication. Limited evaluation at the surgical levels due to streak artifact with likely severe osseous neural foraminal stenosis at the C3-C4 level. No acute fracture. No aggressive appearing focal osseous lesion or focal pathologic process. Soft tissues and spinal canal: No prevertebral fluid or swelling. No visible canal hematoma. Upper chest: Unremarkable. Other: None. IMPRESSION: 1. No acute intracranial abnormality. 2.  No acute displaced facial fracture. 3. No acute displaced fracture or traumatic listhesis of the cervical spine. 4. Limited evaluation at the surgical levels due to streak artifact with likely severe osseous neural foraminal stenosis at the C3-C4 level. Electronically Signed   By: Tish Frederickson M.D.   On: 03/18/2023 17:25   DG Femur Min 2 Views Right  Result Date: 03/18/2023 CLINICAL DATA:  Fall yesterday getting into the tub.  Pain. EXAM: RIGHT FEMUR 2 VIEWS COMPARISON:  None Available. FINDINGS: There is no evidence of fracture or other focal bone lesions. The cortical margins are intact. Hip and knee alignment are grossly maintained, although not entirely included in the field of view. Vascular calcifications are seen. IMPRESSION: No fracture of the right femur. Electronically Signed   By: Ivette Loyal.D.  On: 03/18/2023 16:33   DG Femur Min 2 Views Left  Result Date: 03/18/2023 CLINICAL DATA:  Fall yesterday getting into the tub.  Pain. EXAM: LEFT FEMUR 2 VIEWS COMPARISON:  None Available. FINDINGS: There is no evidence of fracture or other focal bone lesions. Cortical margins of the femur are intact. Hip and knee alignment are maintained. There are vascular calcifications. IMPRESSION: No  fracture of the left femur. Electronically Signed   By: Narda Rutherford M.D.   On: 03/18/2023 16:32   DG Forearm Left  Result Date: 03/18/2023 CLINICAL DATA:  Fall yesterday getting into the tub. Pain. Elbow skin tears. EXAM: LEFT FOREARM - 2 VIEW COMPARISON:  None Available. FINDINGS: There is no evidence of fracture or other focal bone lesions. Wrist and elbow alignment are maintained. No soft tissue gas or radiopaque foreign body. IMPRESSION: No fracture or subluxation of the left forearm. Electronically Signed   By: Narda Rutherford M.D.   On: 03/18/2023 16:31   DG Forearm Right  Result Date: 03/18/2023 CLINICAL DATA:  Fall yesterday getting into the tub. Pain. Skin tears at the elbows. EXAM: RIGHT FOREARM - 2 VIEW COMPARISON:  None Available. FINDINGS: There is no evidence of fracture or other focal bone lesions. Wrist and elbow alignment are maintained. No soft tissue gas or radiopaque foreign body. IMPRESSION: No fracture or dislocation of the right forearm. Electronically Signed   By: Narda Rutherford M.D.   On: 03/18/2023 16:30   DG Shoulder Left  Result Date: 03/18/2023 CLINICAL DATA:  Fall yesterday getting into the tub.  Pain. EXAM: LEFT SHOULDER - 2+ VIEW COMPARISON:  None Available. FINDINGS: There is no evidence of fracture or dislocation. Advanced glenohumeral degenerative change with near complete joint space loss. Superior subluxation of the humeral head abutting the undersurface of the acromion. Mild acromioclavicular degenerative change. Small rounded calcifications in the soft tissues of the upper arm likely represent phleboliths. IMPRESSION: 1. No acute fracture or dislocation of the left shoulder. 2. Advanced glenohumeral degenerative change. Electronically Signed   By: Narda Rutherford M.D.   On: 03/18/2023 16:30    Procedures Procedures    Medications Ordered in ED Medications  morphine (PF) 2 MG/ML injection 2 mg (2 mg Intravenous Given 03/18/23 1451)  ondansetron  (ZOFRAN) injection 4 mg (4 mg Intravenous Given 03/18/23 1454)  iohexol (OMNIPAQUE) 300 MG/ML solution 100 mL (100 mLs Intravenous Contrast Given 03/18/23 1544)  sodium chloride 0.9 % bolus 500 mL (0 mLs Intravenous Stopped 03/18/23 1733)  morphine (PF) 2 MG/ML injection 2 mg (2 mg Intravenous Given 03/18/23 1640)    ED Course/ Medical Decision Making/ A&P   67 year old here for evaluation of mechanical fall which occurred yesterday.  Apparently had a few hours of downtime due to inability to get up.  She denies any LOC, anticoagulation.  She has chronic hypoxic respiratory failure on 3 L at baseline due to COPD.  She denies any increase in shortness of breath.  She has diffuse tenderness, contusions, abrasions, specifically she has ecchymosis and tenderness to her right anterior lower ribs, right upper abdomen as well as her mons pubis.  She has a nonfocal neuroexam.  She denies any syncope.  Will plan on labs, imaging, pain control and reassess  Labs and imaging personally viewed and interpreted:  CBC without leukocytosis Metabolic panel potassium 3.2 CK 462 will give small fluid bolus, last EF 2019 @ 55% X-ray bilateral femur, bilateral forearm, left shoulder without acute traumatic injury, specifically no fracture, dislocation, effusion  Cts without acute traumatic injury EKG without ischemic changes  Patient reassessed.  Requesting additional pain management.  We discussed x-rays, she is pending her CT scans.  Reassessed.  Pain controlled.  Neurovascularly intact.  Ambulatory.  Discussed labs and imaging.  Will have him follow-up outpatient, return for any worsening symptoms  The patient has been appropriately medically screened and/or stabilized in the ED. I have low suspicion for any other emergent medical condition which would require further screening, evaluation or treatment in the ED or require inpatient management.  Patient is hemodynamically stable and in no acute distress.  Patient  able to ambulate in department prior to ED.  Evaluation does not show acute pathology that would require ongoing or additional emergent interventions while in the emergency department or further inpatient treatment.  I have discussed the diagnosis with the patient and answered all questions.  Pain is been managed while in the emergency department and patient has no further complaints prior to discharge.  Patient is comfortable with plan discussed in room and is stable for discharge at this time.  I have discussed strict return precautions for returning to the emergency department.  Patient was encouraged to follow-up with PCP/specialist refer to at discharge.                                 Medical Decision Making Amount and/or Complexity of Data Reviewed Independent Historian:     Details: Family in room External Data Reviewed: labs, radiology, ECG and notes. Labs: ordered. Decision-making details documented in ED Course. Radiology: ordered and independent interpretation performed. Decision-making details documented in ED Course. ECG/medicine tests: ordered and independent interpretation performed. Decision-making details documented in ED Course.  Risk OTC drugs. Prescription drug management. Parenteral controlled substances. Decision regarding hospitalization. Diagnosis or treatment significantly limited by social determinants of health.         Final Clinical Impression(s) / ED Diagnoses Final diagnoses:  Fall, initial encounter  Contusion of scalp, initial encounter  Abrasion, multiple sites  Contusion of abdominal wall, initial encounter    Rx / DC Orders ED Discharge Orders          Ordered    HYDROcodone-acetaminophen (NORCO/VICODIN) 5-325 MG tablet  Every 6 hours PRN,   Status:  Discontinued        03/18/23 1824    oxyCODONE-acetaminophen (PERCOCET/ROXICET) 5-325 MG tablet  Every 6 hours PRN        03/18/23 1841              Dorise Gangi A,  PA-C 03/18/23 2337    Virgina Norfolk, DO 03/19/23 [phone]

## 2023-10-20 NOTE — Progress Notes (Unsigned)
 Cardiology Office Note   Date:  10/21/2023   ID:  [name], DOB 07-18-1956, MRN [id number]  PCP:  Avel Sensor, FNP  Cardiologist:   Rollene Rotunda, MD Referring: Avel Sensor, FNP   Chief Complaint  Patient presents with   Chest Pain      History of Present Illness: [name] is a 68 y.o. female who presents for evaluation of chest pain.  She gives a vague description of chest discomfort but this is mentioned by her primary provider.  She says this is not happening daily.  Last for 10 minutes at a time.  It is a chest pressure.  It is on the left side.  She is very limited in her activities being on 3 L of chronic oxygen.  She does not think she can bring it on.  She thinks it comes at rest.  She does not describe associated nausea vomiting or diaphoresis.  She does not describe palpitations, presyncope or syncope.  She had a fall last year and was in the emergency room.  She had syncope a couple of years ago and was in the emergency room.  She says she gets dizzy and has vertigo and other dizziness but she does not have frank syncope.  I saw her in 2019 preop shoulder surgery.  She had coronary calcium before this with a negative perfusion study in 2016.     Past Medical History:  Diagnosis Date   Arthritis    Asthma    Breo and Combivent daily   CHF (congestive heart failure) (HCC)    takes Lasix daily   Complication of anesthesia    slow to wake ; " everytime i have surgery my oxgen drops down below the 90s and it stays like that for sometime days after surgery "    COPD (chronic obstructive pulmonary disease) (HCC)    Decreased spinal mobility    cervical     Depression    takes Wellbutrin and Citalopram daily   Emphysema lung (HCC)    GERD (gastroesophageal reflux disease)    takes Omeprazole daily   Glaucoma    mild"---unsure if correct , has heard that she has it and hear that she does not have it    History of bronchitis 5-6wks ago   Mood disorder  (HCC)    On home oxygen therapy    2.5L prn and at bedtime   Peripheral neuropathy    Pneumonia under the age of 85   hx of   Vertigo    takes Meclizine daily as needed   Weakness    numbness and tingling in right arm and leg    Past Surgical History:  Procedure Laterality Date   ANTERIOR CERVICAL CORPECTOMY N/A 06/22/2015   Procedure: ANTERIOR CERVICAL DECOMPRESSION/DISCECTOMY FUSION C6 - C7 with corpectomy;  Surgeon: Donalee Citrin, MD;  Location: [facility];  Service: Neurosurgery;  Laterality: N/A;   BACK SURGERY     lumbar; i had 3 lower back surgeries    NECK SURGERY     POSTERIOR CERVICAL FUSION/FORAMINOTOMY  07/08/2015   Procedure: POSTERIOR CERVICAL FUSION/FORAMINOTOMY CERVICAL TWO TO CERVICAL SEVEN;  Surgeon: Donalee Citrin, MD;  Location: [facility];  Service: Neurosurgery;;   POSTERIOR FUSION CERVICAL SPINE  07/08/2015   REVERSE SHOULDER ARTHROPLASTY Right 08/14/2018   Procedure: REVERSE SHOULDER ARTHROPLASTY;  Surgeon: Francena Hanly, MD;  Location: [facility];  Service: Orthopedics;  Laterality: Right;    ROTATOR CUFF  REPAIR     right   TONSILLECTOMY AND ADENOIDECTOMY     TUBAL LIGATION       Current Outpatient Medications  Medication Sig Dispense Refill   ALPRAZolam (XANAX) 0.25 MG tablet Take 0.25 mg by mouth every 6 (six) hours as needed.     cetirizine (ZYRTEC) 10 MG tablet Take 10 mg by mouth at bedtime.     citalopram (CELEXA) 40 MG tablet Take 40 mg by mouth daily.     COMBIVENT RESPIMAT 20-100 MCG/ACT AERS respimat UP TO ONE PUFF EVERY 4 HOURS IF NEEDED (Patient taking differently: Inhale 1 puff into the lungs every 4 (four) hours as needed for wheezing.) 4 g 3   furosemide (LASIX) 20 MG tablet Take 1 tablet (20 mg total) by mouth daily. 90 tablet 3   gabapentin (NEURONTIN) 300 MG capsule Take 300 mg by mouth 3 (three) times daily.     guaifenesin (HUMIBID E) 400 MG TABS tablet Take 400 mg by mouth 3 (three) times daily.      indomethacin (INDOCIN) 50 MG  capsule Take 50 mg by mouth 3 (three) times daily.     methocarbamol (ROBAXIN) 750 MG tablet Take 1 tablet (750 mg total) by mouth every 4 (four) hours. 40 tablet 1   montelukast (SINGULAIR) 10 MG tablet Take 10 mg by mouth at bedtime.     omeprazole (PRILOSEC) 20 MG capsule Take 20 mg by mouth daily.     TRELEGY ELLIPTA 200-62.5-25 MCG/ACT AEPB Inhale 1 puff into the lungs daily.     Vitamin D, Ergocalciferol, (DRISDOL) 1.25 MG (50000 UNIT) CAPS capsule Take 50,000 Units by mouth every 7 (seven) days.     zolpidem (AMBIEN) 10 MG tablet Take 10 mg by mouth at bedtime as needed.     acetaminophen (TYLENOL) 500 MG tablet Take 1,000 mg by mouth 2 (two) times daily. (Patient not taking: Reported on 10/21/2023)     budesonide-formoterol (SYMBICORT) 160-4.5 MCG/ACT inhaler Inhale 2 puffs into the lungs 2 (two) times daily. (Patient not taking: Reported on 10/21/2023)     indomethacin (INDOCIN SR) 75 MG CR capsule Take 75 mg by mouth 2 (two) times daily. (Patient not taking: Reported on 10/21/2023)     meclizine (ANTIVERT) 25 MG tablet Take 25 mg by mouth every 6 (six) hours as needed. (Patient not taking: Reported on 10/21/2023)     OXYGEN 2.5 L at bedtime with sleep and "when I am at home and I need it"     predniSONE (DELTASONE) 10 MG tablet Take by mouth. 6 tablets for 2 days 4 for 2 days 2 for 2 days 1 for 2 days (Patient not taking: Reported on 10/21/2023)     No current facility-administered medications for this visit.    Allergies:   Azithromycin    Social History:  The patient  reports that she quit smoking about 26 years ago. Her smoking use included cigarettes. She started smoking about 41 years ago. She has a 15 pack-year smoking history. She has never used smokeless tobacco. She reports that she does not drink alcohol and does not use drugs.   Family History:  The patient's family history includes Allergies in her mother; Asthma in her mother; CAD (age of onset: 47) in her father; CAD (age of  onset: 52) in her brother; COPD in her mother; Emphysema in her paternal grandfather; Heart disease in her mother.    ROS:  Please see the history of present illness.   Otherwise, review  of systems are positive for none.   All other systems are reviewed and negative.    PHYSICAL EXAM: VS:  BP (!) 126/90   Pulse (!) 101   Ht 5\' 2"  (1.575 m)   Wt 177 lb (80.3 kg)   BMI 32.37 kg/m  , BMI Body mass index is 32.37 kg/m. GENERAL:  Well appearing HEENT:  Pupils equal round and reactive, fundi not visualized, oral mucosa unremarkable NECK:  No jugular venous distention, waveform within normal limits, carotid upstroke brisk and symmetric, no bruits, no thyromegaly LYMPHATICS:  No cervical, inguinal adenopathy LUNGS: Decreased breath sounds with coarse crackles and scattered wheezes BACK:  No CVA tenderness CHEST:  Unremarkable HEART:  PMI not displaced or sustained,S1 and S2 within normal limits, no S3, no S4, no clicks, no rubs, no murmurs ABD:  Flat, positive bowel sounds normal in frequency in pitch, no bruits, no rebound, no guarding, no midline pulsatile mass, no hepatomegaly, no splenomegaly EXT:  2 plus pulses throughout, no edema, no cyanosis no clubbing SKIN:  No rashes no nodules NEURO:  Cranial nerves II through XII grossly intact, motor grossly intact throughout [facility]:  Cognitively intact, oriented to person place and time    EKG:  EKG Interpretation Date/Time:  Monday October 21 2023 11:00:21 EDT Ventricular Rate:  101 PR Interval:  168 QRS Duration:  72 QT Interval:  354 QTC Calculation: 459 R Axis:   56  Text Interpretation: Sinus tachycardia When compared with ECG of 18-Mar-2023 13:26, No significant change since last tracing Confirmed by Rollene Rotunda ([id number]) on 10/21/2023 11:21:38 AM     Recent Labs: 03/18/2023: ALT 23; BUN 20; Creatinine, Ser 0.72; Hemoglobin 12.6; Platelets 217; Potassium 3.2; Sodium 136    Lipid Panel    Component Value Date/Time   CHOL 210  (H) 08/17/2013 0928   TRIG 299.0 (H) 08/17/2013 0928   HDL 38.50 (L) 08/17/2013 0928   CHOLHDL 5 08/17/2013 0928   VLDL 59.8 (H) 08/17/2013 0928   LDLDIRECT 139.9 08/17/2013 0928      Wt Readings from Last 3 Encounters:  10/21/23 177 lb (80.3 kg)  03/18/23 171 lb (77.6 kg)  03/12/22 181 lb (82.1 kg)      Other studies Reviewed: Additional studies/ records that were reviewed today include: Previous ER visit. Review of the above records demonstrates:  Please see elsewhere in the note.     ASSESSMENT AND PLAN:  Precordial chest pain: Her chest discomfort has nonanginal greater than anginal features.  She does have cardiovascular risk factors and known coronary calcium.  She needs stress testing to rule out obstructive coronary disease but she would be able to walk on a treadmill.  Therefore, she will have a PET study.  Elevated coronary calcium:  See above.    SOB: This is probably related to her lung disease.  I will check a BNP level.  She can take her Lasix as needed as she has done before.  Chronic diastolic HF: This was mild on her previous echo.  I do not suspect volume overload.  No change in therapy.  Tachycardia: She vaguely describes some episodes of tachypalpitations perhaps a year ago or so.  She knows to call us if that happens more frequently and she would need to monitor.  It does not seem like this is happening routinely however.   Current medicines are reviewed at length with the patient today.  The patient does not have concerns regarding medicines.  The following changes have been made:  no change  Labs/ tests ordered today include:   Orders Placed This Encounter  Procedures   NM PET CT CARDIAC PERFUSION MULTI W/ABSOLUTE BLOODFLOW   Basic metabolic panel   EKG 12-Lead     Disposition:   FU with with me as needed.     Signed, Rollene Rotunda, MD  10/21/2023 12:12 PM    [facility]

## 2023-10-21 ENCOUNTER — Encounter: Payer: Self-pay | Admitting: Cardiology

## 2023-10-21 ENCOUNTER — Ambulatory Visit: Payer: 59 | Attending: Cardiology | Admitting: Cardiology

## 2023-10-21 VITALS — BP 126/90 | HR 101 | Ht 62.0 in | Wt 177.0 lb

## 2023-10-21 DIAGNOSIS — R931 Abnormal findings on diagnostic imaging of heart and coronary circulation: Secondary | ICD-10-CM | POA: Diagnosis not present

## 2023-10-21 DIAGNOSIS — R072 Precordial pain: Secondary | ICD-10-CM | POA: Diagnosis not present

## 2023-10-21 NOTE — Patient Instructions (Signed)
 Medication Instructions:  No changes.  *If you need a refill on your cardiac medications before your next appointment, please call your pharmacy*   Lab Work: BMET today. If you have labs (blood work) drawn today and your tests are completely normal, you will receive your results only by: MyChart Message (if you have MyChart) OR A paper copy in the mail If you have any lab test that is abnormal or we need to change your treatment, we will call you to review the results.   Testing/Procedures:    Please report to Radiology at the [facility] Main Entrance 30 minutes early for your test.  [address] [place], [place] [address]                      How to Prepare for Your Cardiac PET/CT Stress Test:  Nothing to eat or drink, except water, 3 hours prior to arrival time.  NO caffeine/decaffeinated products, or chocolate 12 hours prior to arrival. (Please note decaffeinated beverages (teas/coffees) still contain caffeine).  If you have caffeine within 12 hours prior, the test will need to be rescheduled.  Medication instructions: Do not take erectile dysfunction medications for 72 hours prior to test (sildenafil, tadalafil) Do not take nitrates (isosorbide mononitrate, Ranexa) the day before or day of test  You may take your remaining medications with water.  NO perfume, cologne or lotion on chest or abdomen area. FEMALES - Please avoid wearing dresses to this appointment.  Total time is 1 to 2 hours; you may want to bring reading material for the waiting time.  IF YOU THINK YOU MAY BE PREGNANT, OR ARE NURSING PLEASE INFORM THE TECHNOLOGIST.  In preparation for your appointment, medication and supplies will be purchased.  Appointment availability is limited, so if you need to cancel or reschedule, please call the Radiology Department Scheduler at [phone] 24 hours in advance to avoid a cancellation fee of $100.00  What to Expect When you Arrive:  Once you arrive  and check in for your appointment, you will be taken to a preparation room within the Radiology Department.  A technologist or Nurse will obtain your medical history, verify that you are correctly prepped for the exam, and explain the procedure.  Afterwards, an IV will be started in your arm and electrodes will be placed on your skin for EKG monitoring during the stress portion of the exam. Then you will be escorted to the PET/CT scanner.  There, staff will get you positioned on the scanner and obtain a blood pressure and EKG.  During the exam, you will continue to be connected to the EKG and blood pressure machines.  A small, safe amount of a radioactive tracer will be injected in your IV to obtain a series of pictures of your heart along with an injection of a stress agent.    After your Exam:  It is recommended that you eat a meal and drink a caffeinated beverage to counter act any effects of the stress agent.  Drink plenty of fluids for the remainder of the day and urinate frequently for the first couple of hours after the exam.  Your doctor will inform you of your test results within 7-10 business days.  For more information and frequently asked questions, please visit our website: [email or web address]  For questions about your test or how to prepare for your test, please call: Cardiac Imaging Nurse Navigators Office: [phone]    Follow-Up: At [facility],  you and your health needs are our priority.  As part of our continuing mission to provide you with exceptional heart care, we have created designated Provider Care Teams.  These Care Teams include your primary Cardiologist (physician) and Advanced Practice Providers (APPs -  Physician Assistants and Nurse Practitioners) who all work together to provide you with the care you need, when you need it.  We recommend signing up for the patient portal called "MyChart".  Sign up information is provided on this After Visit  Summary.  MyChart is used to connect with patients for Virtual Visits (Telemedicine).  Patients are able to view lab/test results, encounter notes, upcoming appointments, etc.  Non-urgent messages can be sent to your provider as well.   To learn more about what you can do with MyChart, go to [email or web address].    Your next appointment:    As needed.   Provider:   Rollene Rotunda, MD     Other Instructions

## 2023-10-22 LAB — BASIC METABOLIC PANEL
BUN/Creatinine Ratio: 27 (ref 12–28)
BUN: 23 mg/dL (ref 8–27)
CO2: 23 mmol/L (ref 20–29)
Calcium: 10 mg/dL (ref 8.7–10.3)
Chloride: 101 mmol/L (ref 96–106)
Creatinine, Ser: 0.85 mg/dL (ref 0.57–1.00)
Glucose: 78 mg/dL (ref 70–99)
Potassium: 4.4 mmol/L (ref 3.5–5.2)
Sodium: 140 mmol/L (ref 134–144)
eGFR: 76 mL/min/{1.73_m2} (ref >59)

## 2024-01-06 ENCOUNTER — Encounter (HOSPITAL_COMMUNITY): Payer: Self-pay

## 2024-01-07 ENCOUNTER — Telehealth (HOSPITAL_COMMUNITY): Payer: Self-pay | Admitting: Emergency Medicine

## 2024-01-07 NOTE — Addendum Note (Signed)
 Addended by: Bebe Bourdon on: 01/07/2024 09:14 AM   Modules accepted: Orders

## 2024-01-08 ENCOUNTER — Encounter (HOSPITAL_COMMUNITY)

## 2024-03-27 ENCOUNTER — Telehealth (HOSPITAL_COMMUNITY): Payer: Self-pay | Admitting: *Deleted

## 2024-03-27 NOTE — Telephone Encounter (Signed)
 Reaching out to patient to offer assistance regarding upcoming cardiac imaging study; pt's son answered phone and verbalizes understanding of appt date/time, parking situation and where to check in, pre-test NPO status and verified current allergies; name and call back number provided for further questions should they arise  Chantal Requena RN Navigator Cardiac Imaging [facility] [phone] office [phone] cell  Patient's son is aware that she is to avoid caffeine 12 hours prior to her cardiac PET scan.

## 2024-03-31 ENCOUNTER — Encounter (HOSPITAL_COMMUNITY): Admission: RE | Admit: 2024-03-31 | Source: Ambulatory Visit

## 2024-04-09 ENCOUNTER — Ambulatory Visit: Admitting: Pulmonary Disease

## 2024-05-11 ENCOUNTER — Encounter (HOSPITAL_COMMUNITY): Payer: Self-pay

## 2024-05-12 ENCOUNTER — Telehealth (HOSPITAL_COMMUNITY): Payer: Self-pay | Admitting: Emergency Medicine

## 2024-05-12 NOTE — Telephone Encounter (Signed)
 Reaching out to patient to offer assistance regarding upcoming cardiac imaging study; pt verbalizes understanding of appt date/time, parking situation and where to check in, pre-test NPO status and medications ordered, and verified current allergies; name and call back number provided for further questions should they arise Rockwell Alexandria RN Navigator Cardiac Imaging [facility] [phone] office [phone] cell

## 2024-05-13 ENCOUNTER — Encounter (HOSPITAL_COMMUNITY)
Admission: RE | Admit: 2024-05-13 | Discharge: 2024-05-13 | Disposition: A | Discharge status: Home / Self Care | Source: Ambulatory Visit | Attending: Cardiology | Admitting: Cardiology

## 2024-05-13 DIAGNOSIS — R072 Precordial pain: Secondary | ICD-10-CM | POA: Insufficient documentation

## 2024-05-13 LAB — NM PET CT CARDIAC PERFUSION MULTI W/ABSOLUTE BLOODFLOW
MBFR: 1.74
Nuc Rest EF: 54 %
Nuc Stress EF: 53 %
Rest MBF: 1.17 ml/g/min
Rest Nuclear Isotope Dose: 20 mCi
ST Depression (mm): 0 mm
Stress MBF: 2.04 ml/g/min
Stress Nuclear Isotope Dose: 20.3 mCi

## 2024-05-13 MED ORDER — REGADENOSON 0.4 MG/5ML IV SOLN
INTRAVENOUS | Status: AC
  Filled 2024-05-13: qty 5

## 2024-05-13 MED ORDER — RUBIDIUM RB82 GENERATOR (RUBYFILL)
19.9600 | PACK | Freq: Once | INTRAVENOUS | Status: AC
  Administered 2024-05-13: 19.96 via INTRAVENOUS

## 2024-05-13 MED ORDER — RUBIDIUM RB82 GENERATOR (RUBYFILL)
20.3200 | PACK | Freq: Once | INTRAVENOUS | Status: AC
  Administered 2024-05-13: 20.32 via INTRAVENOUS

## 2024-05-13 MED ORDER — REGADENOSON 0.4 MG/5ML IV SOLN
0.4000 mg | Freq: Once | INTRAVENOUS | Status: AC
  Administered 2024-05-13: 0.4 mg via INTRAVENOUS

## 2024-05-15 ENCOUNTER — Ambulatory Visit: Admitting: Pulmonary Disease

## 2024-05-17 ENCOUNTER — Ambulatory Visit: Payer: Self-pay | Admitting: Cardiology

## 2024-06-09 ENCOUNTER — Ambulatory Visit: Admitting: Pulmonary Disease

## 2024-06-09 ENCOUNTER — Encounter: Payer: Self-pay | Admitting: Pulmonary Disease

## 2024-06-09 VITALS — BP 133/82 | HR 99 | Ht 61.0 in | Wt 181.2 lb

## 2024-06-09 DIAGNOSIS — Z01811 Encounter for preprocedural respiratory examination: Secondary | ICD-10-CM | POA: Diagnosis not present

## 2024-06-09 DIAGNOSIS — J449 Chronic obstructive pulmonary disease, unspecified: Secondary | ICD-10-CM

## 2024-06-09 DIAGNOSIS — G4734 Idiopathic sleep related nonobstructive alveolar hypoventilation: Secondary | ICD-10-CM

## 2024-06-09 DIAGNOSIS — J439 Emphysema, unspecified: Secondary | ICD-10-CM

## 2024-06-09 NOTE — Patient Instructions (Signed)
 Nice to meet you  No changes to medication, continue all your inhalers, Trelegy 1 puff once a day  Use Combivent  and ipratropium nebulizer every 4-6 hours as needed for wheezing or shortness of breath  We will send a message to your surgeon regarding risk of upcoming surgery.  Return to clinic in 6 months or sooner as needed with Dr. Annella.

## 2024-06-09 NOTE — Progress Notes (Signed)
 @Patient  ID: [name], female    DOB: [birth year], 68 y.o.   MRN: [id number]  Chief Complaint  Patient presents with   Consult    New/ old pt states check up     Referring provider: Meade Santa SQUIBB, FNP  HPI:   68 y.o. woman with history of COPD who were seen for evaluation of the same.  Multiple prior pulmonary notes from Dr. Brien and Dr. Darlean reviewed.  Most recent cardiology note reviewed.  Last seen in pulmonary clinic in 2019.  She has history of CHF and emphysema and COPD.  COPD diagnosed on coronary most recently in 2019.  Moderate severity based on FEV1.  She obtained oxygen around 2015 when she was hospitalized at sounds like for CHF exacerbation.  Sounds like following up shoulder surgery.  The time was not totally clear.  History is very hard to follow but she jumps with tween problems throughout our discussion.  Currently she is use oxygen 24/7 in the house.  She has no ambulatory oxygen.  She states that she was told she could switch over tanks for a POC but when she went to turn the man she did not qualify for ambulatory oxygen has had none since.  She states she has walked multiple times and been to multiple doctors offices but it does not drop below 88%.  Often in the low 90s.  She has significant dyspnea on exertion.  Marked improvement with obtaining Trelegy over the last several months.  Previously it was unattainable due to cost.  She reports good adherence to 1 puff once a day.  She has her rescue medications Combivent , and ipratropium nebulizer on average about once a day.  She denies frequent or recent exacerbations.  No respiratory illnesses within the last month.  Review of labs 02/2024 reveals no significant anemia with a hemoglobin greater than 11.  She also has had some elevated eosinophils in the past up to 500 as recently as 2024.  It seems her chief complaint today is shoulder pain.  Planning for an upcoming shoulder surgery.  Sounds like she recently hurt her  good shoulder her right shoulder and that is giving her some problems as well.  She also complains of chronic neck pain.  She complains of chronic lightheadedness or vertigo.  She had recent nuclear medicine perfusion scan for heart which did demonstrate some looks very mild to moderate ischemia.  I encouraged to have upcoming appointment with Dr. Lavona which is scheduled for next week prior to any surgical decisions.  Questionaires / Pulmonary Flowsheets:   ACT:      No data to display          MMRC:     No data to display          Epworth:      No data to display          Tests:   FENO:  No results found for: NITRICOXIDE  PFT:    Latest Ref Rng & Units 01/25/2016   12:50 PM 08/26/2013    3:28 PM  PFT Results  FVC-Pre L 3.28  2.76   FVC-Predicted Pre % 104  86   FVC-Post L 3.41  2.97   FVC-Predicted Post % 108  93   Pre FEV1/FVC % % 60  57   Post FEV1/FCV % % 63  59   FEV1-Pre L 1.98  1.58   FEV1-Predicted Pre % 81  63   FEV1-Post L  2.13  1.75   DLCO uncorrected ml/min/mmHg 15.63  12.43   DLCO UNC% % 72  57   DLCO corrected ml/min/mmHg 15.27    DLCO COR %Predicted % 70    DLVA Predicted % 65  54   TLC L 5.54  5.45   TLC % Predicted % 116  114   RV % Predicted % 102  134     WALK:     06/10/2018    3:01 PM 04/24/2018    1:55 PM 08/26/2013   12:26 PM  SIX MIN WALK  Supplimental Oxygen during Test? (L/min) No No No  Tech Comments: only did 2 laps, patient not dropping O2 sats, just felt winded, lightheaded, and SOB, walked a slower pace and longer than she would walk on a normal day slow pace/sob//lmr lowest o2 sat during walk 92% ra    Imaging: Personally viewed and as per EMR and discussion in this note NM PET CT CARDIAC PERFUSION MULTI W/ABSOLUTE BLOODFLOW Result Date: 05/13/2024   Findings are consistent with mild (< 10% of myocardium) LAD ischemia.  Decrease flow reserve could be seen in LAD disease or in microvascular ischemia. The study  is intermediate risk due to the decrease in blood flow reserve and lack of increase in LVEF with stress.   LV perfusion is abnormal. There is evidence of ischemia. There is no evidence of infarction. Defect 1: There is a medium defect with moderate reduction in uptake present in the apical to mid anterior location(s) that is reversible. There is normal wall motion in the defect area. Consistent with ischemia.   Rest left ventricular function is normal. Rest EF: 54%. Stress left ventricular function is normal. Stress EF: 53%. End diastolic cavity size is normal. End systolic cavity size is normal.   Myocardial blood flow was computed to be 1.38ml/g/min at rest and 2.04ml/g/min at stress. Global myocardial blood flow reserve was 1.74 and was mildly abnormal.   Coronary calcium was present on the attenuation correction CT images. Moderate coronary calcifications were present. Coronary calcifications were present in the left anterior descending artery and left circumflex artery distribution(s). Aortic atherosclerosis.   Electronically Signed  By: Stanly Leavens M.D. EXAM: OVER-READ INTERPRETATION CARDIAC PET-CT The following report is an over-read performed by radiologist Dr. Camellia Lang [facility] on 05/13/2024. This over-read does not include interpretation of cardiac or coronary anatomy or pathology. The cardiac PET and cardiac CT interpretation by the cardiologist is to be attached. COMPARISON:  Chest CT 03/18/2023 FINDINGS: No evidence for lymphadenopathy within the visualized mediastinum or hilar regions. Centrilobular emphysema noted. Dependent atelectasis evident bilaterally. Visualized portions of the upper abdomen are unremarkable. No suspicious lytic or sclerotic osseous abnormality. IMPRESSION: No acute or clinically significant extracardiac findings. Electronically Signed   By: Camellia Candle M.D.   On: 05/13/2024 09:47   Lab Results: Personally reviewed CBC    Component Value  Date/Time   WBC 9.4 03/18/2023 1428   RBC 4.16 03/18/2023 1428   HGB 12.6 03/18/2023 1428   HCT 38.0 03/18/2023 1428   PLT 217 03/18/2023 1428   MCV 91.3 03/18/2023 1428   MCH 30.3 03/18/2023 1428   MCHC 33.2 03/18/2023 1428   RDW 13.2 03/18/2023 1428   LYMPHSABS 3.5 03/18/2023 1428   MONOABS 0.8 03/18/2023 1428   EOSABS 0.5 03/18/2023 1428   BASOSABS 0.1 03/18/2023 1428    BMET    Component Value Date/Time   NA 140 10/21/2023 1201   K 4.4 10/21/2023 1201  CL 101 10/21/2023 1201   CO2 23 10/21/2023 1201   GLUCOSE 78 10/21/2023 1201   GLUCOSE 96 03/18/2023 1428   BUN 23 10/21/2023 1201   CREATININE 0.85 10/21/2023 1201   CREATININE 0.84 06/25/2018 1146   CALCIUM 10.0 10/21/2023 1201   GFRNONAA >60 03/18/2023 1428   GFRAA >60 08/12/2018 1430    BNP    Component Value Date/Time   BNP 23.0 07/02/2018 1400   BNP 8 06/25/2018 1146    ProBNP    Component Value Date/Time   PROBNP 627.6 (H) 07/31/2013 0653    Specialty Problems       Pulmonary Problems   Emphysema/COPD Gold B   PFTs 08/27/2013: moderate obstruction, air trapping, reduced DLCO      COPD GOLD II   Quit smoking 1999 -   PFT's  08/26/13   FEV1 1.75 (70 % ) ratio 59  p 10 % improvement from saba   with DLCO  57 % corrects to 54 % for alv volume   - alpha One AT screen  10/16/17  :  MM/ level 128  10/13/2015  try symbicort  160 2bid - PFT's  01/25/2016  FEV1 2.13  (87 % ) ratio 63  p 7 % improvement from saba p breo/combivent  prior to study with DLCO  72/70 % corrects to 65 % for alv volume   - 01/25/2016    changed to stiolto from breo > worse so back to breo but changed to 200 from 100  - Rehab 02/07/16 - stopped 9/6 /17 inconsistent attendance / did not want to continue due to neck pain  - 04/25/2016   > changed again  to breo 100 from 200 and use combivent  prn   - Spirometry 04/24/2018   Unable to perform - FENO 04/24/2018  =   Unable to perform - 06/03/2018   try bevespi  > d/c'd by pt 06/23/18 due not not  working and changed herself back to Breo 100 though not covered by present insurance Spirometry 07/08/2018  FEV1 1.5 (61%)  Ratio 60 mild curvature p breo 100 in am        DOE (dyspnea on exertion)   cpst  08/10/16: Conclusion: Exercise testing with gas exchange demonstrates a mild functional impairment when compared to matched sedentary norms. There is likely a mild circulatory limitation factor playing a role in exercise intolerance, given low PVO2 in combination with low OUES and low/blunted O2/pulse response (considering only mild chronotropic incompetence). Given pre-exercise spirometry, patient most likely primary limited due to pulmonary obstructive physiology. Patient's body habitus is contributing to her dyspnea and exercise intolerance. Patient is significantly deconditioned and may benefit from routine/monitored exercise program. - 04/24/2018   Walked RA x one lap @ 185 stopped due to  Sob at slow pace, no desats        Allergies  Allergen Reactions   Azithromycin Nausea And Vomiting    Immunization History  Administered Date(s) Administered   Influenza,inj,Quad PF,6+ Mos 08/26/2013, 05/18/2014, 05/13/2015, 04/25/2016, 04/24/2018   Influenza,inj,quad, With Preservative 03/31/2018   Pneumococcal Polysaccharide-23 08/18/2013    Past Medical History:  Diagnosis Date   Arthritis    Asthma    Breo and Combivent  daily   CHF (congestive heart failure) (HCC)    takes Lasix  daily   Complication of anesthesia    slow to wake ;  everytime i have surgery my oxgen drops down below the 90s and it stays like that for sometime days after surgery  COPD (chronic obstructive pulmonary disease) (HCC)    Decreased spinal mobility    cervical     Depression    takes Wellbutrin  and Citalopram  daily   Emphysema lung (HCC)    GERD (gastroesophageal reflux disease)    takes Omeprazole daily   Glaucoma    mild---unsure if correct , has heard that she has it and hear that she does not  have it    History of bronchitis 5-6wks ago   Mood disorder    On home oxygen therapy    2.5L prn and at bedtime   Peripheral neuropathy    Pneumonia under the age of 34   hx of   Vertigo    takes Meclizine daily as needed   Weakness    numbness and tingling in right arm and leg    Tobacco History: Social History   Tobacco Use  Smoking Status Former   Current packs/day: 0.00   Average packs/day: 1 pack/day for 15.0 years (15.0 ttl pk-yrs)   Types: Cigarettes   Start date: 07/30/1982   Quit date: 07/30/1997   Years since quitting: 26.8  Smokeless Tobacco Never   Counseling given: Not Answered   Continue to not smoke  Outpatient Encounter Medications as of 06/09/2024  Medication Sig   acetaminophen  (TYLENOL ) 500 MG tablet Take 1,000 mg by mouth 2 (two) times daily.   ALPRAZolam (XANAX) 0.25 MG tablet Take 0.25 mg by mouth every 6 (six) hours as needed.   cetirizine (ZYRTEC) 10 MG tablet Take 10 mg by mouth at bedtime.   citalopram  (CELEXA ) 40 MG tablet Take 40 mg by mouth daily.   COMBIVENT  RESPIMAT 20-100 MCG/ACT AERS respimat UP TO ONE PUFF EVERY 4 HOURS IF NEEDED (Patient taking differently: Inhale 1 puff into the lungs every 4 (four) hours as needed for wheezing.)   furosemide  (LASIX ) 20 MG tablet Take 1 tablet (20 mg total) by mouth daily.   gabapentin (NEURONTIN) 300 MG capsule Take 300 mg by mouth 3 (three) times daily.   guaifenesin  (HUMIBID E) 400 MG TABS tablet Take 400 mg by mouth 3 (three) times daily.    indomethacin  (INDOCIN  SR) 75 MG CR capsule Take 75 mg by mouth 2 (two) times daily.   indomethacin  (INDOCIN ) 50 MG capsule Take 50 mg by mouth 3 (three) times daily.   meclizine (ANTIVERT) 25 MG tablet Take 25 mg by mouth every 6 (six) hours as needed.   methocarbamol  (ROBAXIN ) 750 MG tablet Take 1 tablet (750 mg total) by mouth every 4 (four) hours.   montelukast  (SINGULAIR ) 10 MG tablet Take 10 mg by mouth at bedtime.   omeprazole (PRILOSEC) 20 MG capsule Take  20 mg by mouth daily.   OXYGEN 2.5 L at bedtime with sleep and when I am at home and I need it   TRELEGY ELLIPTA 200-62.5-25 MCG/ACT AEPB Inhale 1 puff into the lungs daily.   Vitamin D , Ergocalciferol , (DRISDOL) 1.25 MG (50000 UNIT) CAPS capsule Take 50,000 Units by mouth every 7 (seven) days.   zolpidem  (AMBIEN ) 10 MG tablet Take 10 mg by mouth at bedtime as needed.   [DISCONTINUED] budesonide -formoterol  (SYMBICORT ) 160-4.5 MCG/ACT inhaler Inhale 2 puffs into the lungs 2 (two) times daily.   [DISCONTINUED] predniSONE  (DELTASONE ) 10 MG tablet Take by mouth. 6 tablets for 2 days 4 for 2 days 2 for 2 days 1 for 2 days (Patient not taking: Reported on 06/09/2024)   No facility-administered encounter medications on file as of 06/09/2024.  Review of Systems  Review of Systems  No chest pain with exertion.  Orthopnea or PND.  Comprehensive review of systems otherwise negative. Physical Exam  BP 133/82   Pulse 99   Ht 5' 1 (1.549 m) Comment: per pt  Wt 181 lb 3.2 oz (82.2 kg)   SpO2 94%   BMI 34.24 kg/m   Wt Readings from Last 5 Encounters:  06/09/24 181 lb 3.2 oz (82.2 kg)  10/21/23 177 lb (80.3 kg)  03/18/23 171 lb (77.6 kg)  03/12/22 181 lb (82.1 kg)  08/14/18 184 lb (83.5 kg)    BMI Readings from Last 5 Encounters:  06/09/24 34.24 kg/m  10/21/23 32.37 kg/m  03/18/23 31.28 kg/m  03/12/22 34.20 kg/m  08/14/18 33.65 kg/m     Physical Exam General: Sitting in wheelchair, no acute distress Eyes: EOMI, no icterus Pulmonary: Distant, clear, normal work of breathing, mild conversational dyspnea on room air Cardiovascular: Regular rate and rhythm, no murmur Abdomen: Nondistended Neuro: Appears deconditioned, does not ambulate as in wheelchair Psych: Normal mood, full affect  Assessment & Plan:   COPD: Moderate based on spirometry in 2019. Due to emphysema seen on CT scan.  Without frequent exacerbations as far as I can tell.  Improvement in symptoms after  escalation of triple inhaled therapy via Trelegy high-dose.  To continue this.  Encouraged continuing Combivent  and ipratropium nebulizers as needed for wheezing or shortness of breath.  Notably eosinophils 520 24 on review of labs, consider Biologics in the future if symptoms worsen.  Dyspnea on exertion: Likely multifactorial related to underlying COPD, severe deconditioning, habitus, concern for underlying coronary artery disease as demonstrate by recent perfusion scan.  Continue inhalers as above.  Encouraged upcoming visit with cardiologist discussed recent perfusion scan.  Preoperative evaluation: Pulmonary medicine is not provide preoperative clearance or other preoperative risk assessment.  Based on the ARISCAT model, patient is lower 1.6% risk of postoperative pulmonary complication of duration of surgery is less than 2 hours.  If the duration of surgery is greater than 2 hours, patient is intermediate or 13.3% risk of postoperative pulmonary complication.  I suspect her postoperative risk is underestimated given what appears to be significant deconditioning.  She had a recent abnormal nuclear medicine perfusion scan of the heart.  I recommend cardiac evaluation prior to surgery.  Nocturnal hypoxemia: Wears oxygen 24/7 at home.  Has not desaturated on ambulation or prior awake assessments per her report.  Continue oxygen at night.  Too frail to ambulate today.  Consider trial of ambulation to assess for exertional hypoxemia in the future if able.   Return in about 6 months (around 12/07/2024) for f/u Dr. Annella.   Jo JONELLE Annella, MD 06/09/2024   This appointment required 61 minutes of patient care (this includes precharting, chart review, review of results, face-to-face care, etc.).

## 2024-06-17 DIAGNOSIS — R072 Precordial pain: Secondary | ICD-10-CM | POA: Insufficient documentation

## 2024-06-17 DIAGNOSIS — R931 Abnormal findings on diagnostic imaging of heart and coronary circulation: Secondary | ICD-10-CM | POA: Insufficient documentation

## 2024-06-17 NOTE — Progress Notes (Unsigned)
  Cardiology Office Note:   Date:  06/18/2024  ID:  [name], DOB May 09, 1956, MRN [id number] PCP: Meade Santa SQUIBB, FNP  [facility] Providers Cardiologist:  Lynwood Schilling, MD {  History of Present Illness:   [name] is a 69 y.o. female  who presents for evaluation of chest pain.    I saw her in 2019 preop shoulder surgery.  She had coronary calcium before this with a negative perfusion study in 2016.  This demonstrated decreased flow reserve.  There was a small area of ischemia in the apical to mid anterior location.  Global myocardial blood flow reserve was mildly reduced.  She was managed medically.    Since I last saw her she has had no new cardiac complaints.  She is limited by lung disease and orthopedic symptoms.  The patient denies any new symptoms such as chest discomfort, neck or arm discomfort. There has been no new shortness of breath, PND or orthopnea. There have been no reported palpitations, presyncope or syncope.  She gets some fleeting chest discomfort sporadically that has been a stable pattern.  She is mostly limited by O2 dependent respiratory failure.    ROS: As stated in the HPI and negative for all other systems.  Studies Reviewed:    EKG:     NA  Risk Assessment/Calculations:          Physical Exam:   VS:  BP 130/70 (BP Location: Left Arm, Patient Position: Sitting, Cuff Size: Normal)   Pulse 96   Ht 5' 1 (1.549 m)   Wt 183 lb (83 kg)   BMI 34.58 kg/m    Wt Readings from Last 3 Encounters:  06/18/24 183 lb (83 kg)  06/09/24 181 lb 3.2 oz (82.2 kg)  10/21/23 177 lb (80.3 kg)     GEN: Well nourished, well developed in no acute distress NECK: No JVD; No carotid bruits CARDIAC: RRR, no murmurs, rubs, gallops RESPIRATORY:  Clear to auscultation without rales, wheezing or rhonchi  ABDOMEN: Soft, non-tender, non-distended EXTREMITIES:  No edema; No deformity   ASSESSMENT AND PLAN:   Precordial chest pain: Her chest pain is nonanginal  greater than anginal.  She had no high risk findings on her PET test.  She does have cardiovascular risk factors with her history of heart failure and coronary calcium with some mild ischemia.  Her risk calculation is 5% for major cardiovascular events and she understands this.   I am going to add isosorbide 30 mg daily.  She really cannot be an aspirin candidate because she takes daily nonsteroidals.  Elevated coronary calcium: She needs continued risk reduction.  She has COPD and emphysema and is O2 dependent.  She does have probably some chronic diastolic dysfunction and she can take her Lasix  as needed.  SOB:    Chronic diastolic HF: She had normal ejection fraction previously.  No change in therapy.    Preop: See above   Follow up with an APP in 1 year  Signed, Lynwood Schilling, MD

## 2024-06-18 ENCOUNTER — Ambulatory Visit: Attending: Cardiology | Admitting: Cardiology

## 2024-06-18 ENCOUNTER — Encounter: Payer: Self-pay | Admitting: Cardiology

## 2024-06-18 ENCOUNTER — Other Ambulatory Visit (HOSPITAL_COMMUNITY): Payer: Self-pay

## 2024-06-18 VITALS — BP 130/70 | HR 96 | Ht 61.0 in | Wt 183.0 lb

## 2024-06-18 DIAGNOSIS — I5032 Chronic diastolic (congestive) heart failure: Secondary | ICD-10-CM | POA: Diagnosis not present

## 2024-06-18 DIAGNOSIS — R931 Abnormal findings on diagnostic imaging of heart and coronary circulation: Secondary | ICD-10-CM

## 2024-06-18 DIAGNOSIS — R072 Precordial pain: Secondary | ICD-10-CM | POA: Diagnosis not present

## 2024-06-18 DIAGNOSIS — R0602 Shortness of breath: Secondary | ICD-10-CM | POA: Diagnosis not present

## 2024-06-18 MED ORDER — ISOSORBIDE MONONITRATE ER 30 MG PO TB24
30.0000 mg | ORAL_TABLET | Freq: Every day | ORAL | 3 refills | Status: AC
  Filled 2024-06-18: qty 90, 90d supply, fill #0

## 2024-06-18 NOTE — Patient Instructions (Signed)
 Medication Instructions:  Your physician has recommended you make the following change in your medication: 1 1) START taking Imdur (isosorbide mononitrate) 30 mg once daily  *If you need a refill on your cardiac medications before your next appointment, please call your pharmacy*   Follow-Up: At [facility], you and your health needs are our priority.  As part of our continuing mission to provide you with exceptional heart care, our providers are all part of one team.  This team includes your primary Cardiologist (physician) and Advanced Practice Providers or APPs (Physician Assistants and Nurse Practitioners) who all work together to provide you with the care you need, when you need it.  Your next appointment:   1 year  Provider:   One of our Advanced Practice Providers (APPs): Morse Clause, PA-C  Lamarr Satterfield, NP Miriam Shams, NP  Olivia Pavy, PA-C Josefa Beauvais, NP  Leontine Salen, PA-C Orren Fabry, PA-C  Durand, PA-C Ernest Dick, NP  Damien Braver, NP Jon Hails, PA-C  Waddell Donath, PA-C    Dayna Dunn, PA-C  Scott Weaver, PA-C Lum Louis, NP Katlyn West, NP Callie Goodrich, PA-C  Xika Zhao, NP Sheng Haley, PA-C    Kathleen Johnson, PA-C

## 2024-08-10 ENCOUNTER — Other Ambulatory Visit (HOSPITAL_COMMUNITY): Payer: Self-pay
